# Patient Record
Sex: Male | Born: 1945 | Race: Black or African American | Hispanic: No | Smoking: Former smoker
Health system: Southern US, Community
[De-identification: ages and names within clinical notes are randomized; demographics above are authoritative.]

## PROBLEM LIST (undated history)

## (undated) VITALS — BP 172/80 | HR 65 | Ht 66.0 in | Wt 168.1 lb

## (undated) DIAGNOSIS — E113299 Type 2 diabetes mellitus with mild nonproliferative diabetic retinopathy without macular edema, unspecified eye: Secondary | ICD-10-CM

## (undated) DIAGNOSIS — E1122 Type 2 diabetes mellitus with diabetic chronic kidney disease: Secondary | ICD-10-CM

## (undated) DIAGNOSIS — M549 Dorsalgia, unspecified: Secondary | ICD-10-CM

## (undated) DIAGNOSIS — J9620 Acute and chronic respiratory failure, unspecified whether with hypoxia or hypercapnia: Secondary | ICD-10-CM

## (undated) DIAGNOSIS — N521 Erectile dysfunction due to diseases classified elsewhere: Secondary | ICD-10-CM

## (undated) DIAGNOSIS — I10 Essential (primary) hypertension: Secondary | ICD-10-CM

## (undated) DIAGNOSIS — N189 Chronic kidney disease, unspecified: Secondary | ICD-10-CM

## (undated) DIAGNOSIS — E1169 Type 2 diabetes mellitus with other specified complication: Secondary | ICD-10-CM

## (undated) DIAGNOSIS — D631 Anemia in chronic kidney disease: Secondary | ICD-10-CM

## (undated) DIAGNOSIS — G8929 Other chronic pain: Secondary | ICD-10-CM

## (undated) DIAGNOSIS — B351 Tinea unguium: Secondary | ICD-10-CM

## (undated) DIAGNOSIS — N400 Enlarged prostate without lower urinary tract symptoms: Secondary | ICD-10-CM

## (undated) DIAGNOSIS — J189 Pneumonia, unspecified organism: Secondary | ICD-10-CM

## (undated) DIAGNOSIS — I129 Hypertensive chronic kidney disease with stage 1 through stage 4 chronic kidney disease, or unspecified chronic kidney disease: Secondary | ICD-10-CM

## (undated) DIAGNOSIS — Z720 Tobacco use: Secondary | ICD-10-CM

## (undated) DIAGNOSIS — I5033 Acute on chronic diastolic (congestive) heart failure: Secondary | ICD-10-CM

## (undated) DIAGNOSIS — E785 Hyperlipidemia, unspecified: Secondary | ICD-10-CM

## (undated) DIAGNOSIS — N184 Chronic kidney disease, stage 4 (severe): Secondary | ICD-10-CM

## (undated) HISTORY — PX: NO PAST SURGERIES: SHX2092

## (undated) HISTORY — DX: Hyperlipidemia, unspecified: E78.5

## (undated) HISTORY — DX: Tinea unguium: B35.1

## (undated) HISTORY — DX: Benign prostatic hyperplasia without lower urinary tract symptoms: N40.0

## (undated) HISTORY — DX: Chronic kidney disease, unspecified: N18.9

## (undated) HISTORY — DX: Anemia in chronic kidney disease: D63.1

## (undated) HISTORY — DX: Tobacco use: Z72.0

## (undated) HISTORY — DX: Type 2 diabetes mellitus with mild nonproliferative diabetic retinopathy without macular edema, unspecified eye: E11.3299

## (undated) HISTORY — DX: Essential (primary) hypertension: I10

## (undated) HISTORY — DX: Chronic kidney disease, stage 4 (severe): N18.4

## (undated) HISTORY — DX: Hypertensive chronic kidney disease with stage 1 through stage 4 chronic kidney disease, or unspecified chronic kidney disease: I12.9

## (undated) HISTORY — DX: Type 2 diabetes mellitus with diabetic chronic kidney disease: E11.22

---

## 1998-11-23 ENCOUNTER — Encounter: Admission: RE | Admit: 1998-11-23 | Discharge: 1999-02-21 | Payer: Self-pay | Admitting: Family Medicine

## 1999-01-11 DIAGNOSIS — E1169 Type 2 diabetes mellitus with other specified complication: Secondary | ICD-10-CM

## 1999-01-11 HISTORY — DX: Type 2 diabetes mellitus with other specified complication: E11.69

## 1999-01-28 ENCOUNTER — Ambulatory Visit (HOSPITAL_COMMUNITY): Admission: RE | Admit: 1999-01-28 | Discharge: 1999-01-28 | Payer: Self-pay | Admitting: Orthopedic Surgery

## 1999-01-28 ENCOUNTER — Encounter: Payer: Self-pay | Admitting: Orthopedic Surgery

## 2003-02-25 ENCOUNTER — Encounter: Admission: RE | Admit: 2003-02-25 | Discharge: 2003-02-25 | Payer: Self-pay | Admitting: Internal Medicine

## 2003-03-04 ENCOUNTER — Encounter: Admission: RE | Admit: 2003-03-04 | Discharge: 2003-03-04 | Payer: Self-pay | Admitting: Internal Medicine

## 2003-03-18 ENCOUNTER — Encounter: Admission: RE | Admit: 2003-03-18 | Discharge: 2003-03-18 | Payer: Self-pay | Admitting: Internal Medicine

## 2003-04-01 ENCOUNTER — Encounter: Admission: RE | Admit: 2003-04-01 | Discharge: 2003-04-01 | Payer: Self-pay | Admitting: Internal Medicine

## 2003-08-22 ENCOUNTER — Encounter: Admission: RE | Admit: 2003-08-22 | Discharge: 2003-08-22 | Payer: Self-pay | Admitting: Internal Medicine

## 2003-08-29 ENCOUNTER — Encounter: Admission: RE | Admit: 2003-08-29 | Discharge: 2003-08-29 | Payer: Self-pay | Admitting: Internal Medicine

## 2003-10-01 ENCOUNTER — Encounter: Admission: RE | Admit: 2003-10-01 | Discharge: 2003-12-30 | Payer: Self-pay | Admitting: *Deleted

## 2003-11-13 ENCOUNTER — Ambulatory Visit: Payer: Self-pay | Admitting: Internal Medicine

## 2004-02-17 ENCOUNTER — Ambulatory Visit: Payer: Self-pay | Admitting: Internal Medicine

## 2004-11-29 ENCOUNTER — Ambulatory Visit: Payer: Self-pay | Admitting: Internal Medicine

## 2004-12-23 ENCOUNTER — Ambulatory Visit: Payer: Self-pay | Admitting: Internal Medicine

## 2005-02-10 ENCOUNTER — Ambulatory Visit: Payer: Self-pay | Admitting: Internal Medicine

## 2005-04-11 ENCOUNTER — Ambulatory Visit: Payer: Self-pay | Admitting: Hospitalist

## 2005-06-03 ENCOUNTER — Ambulatory Visit: Payer: Self-pay | Admitting: Internal Medicine

## 2005-09-13 ENCOUNTER — Ambulatory Visit: Payer: Self-pay | Admitting: Internal Medicine

## 2005-09-13 ENCOUNTER — Encounter (INDEPENDENT_AMBULATORY_CARE_PROVIDER_SITE_OTHER): Payer: Self-pay | Admitting: *Deleted

## 2005-09-22 ENCOUNTER — Ambulatory Visit: Payer: Self-pay | Admitting: Internal Medicine

## 2005-09-27 ENCOUNTER — Ambulatory Visit: Payer: Self-pay | Admitting: Hospitalist

## 2005-10-03 ENCOUNTER — Ambulatory Visit: Payer: Self-pay | Admitting: Internal Medicine

## 2005-10-10 ENCOUNTER — Ambulatory Visit: Payer: Self-pay | Admitting: Hospitalist

## 2005-10-17 ENCOUNTER — Ambulatory Visit: Payer: Self-pay | Admitting: Internal Medicine

## 2005-10-24 ENCOUNTER — Ambulatory Visit: Payer: Self-pay | Admitting: Internal Medicine

## 2005-10-24 ENCOUNTER — Encounter (INDEPENDENT_AMBULATORY_CARE_PROVIDER_SITE_OTHER): Payer: Self-pay | Admitting: Internal Medicine

## 2005-10-24 LAB — CONVERTED CEMR LAB
BUN: 27 mg/dL — ABNORMAL HIGH (ref 6–23)
CO2: 23 meq/L (ref 19–32)
Calcium: 9.5 mg/dL (ref 8.4–10.5)
Chloride: 105 meq/L (ref 96–112)
Creatinine, Ser: 1.64 mg/dL — ABNORMAL HIGH (ref 0.40–1.50)
Glucose, Bld: 200 mg/dL — ABNORMAL HIGH (ref 70–99)
Potassium: 4 meq/L (ref 3.5–5.3)
Sodium: 142 meq/L (ref 135–145)

## 2005-11-16 DIAGNOSIS — N521 Erectile dysfunction due to diseases classified elsewhere: Secondary | ICD-10-CM

## 2005-11-16 DIAGNOSIS — I10 Essential (primary) hypertension: Secondary | ICD-10-CM | POA: Insufficient documentation

## 2005-11-16 DIAGNOSIS — E1159 Type 2 diabetes mellitus with other circulatory complications: Secondary | ICD-10-CM

## 2005-11-16 DIAGNOSIS — I129 Hypertensive chronic kidney disease with stage 1 through stage 4 chronic kidney disease, or unspecified chronic kidney disease: Secondary | ICD-10-CM

## 2005-11-16 DIAGNOSIS — E1122 Type 2 diabetes mellitus with diabetic chronic kidney disease: Secondary | ICD-10-CM | POA: Insufficient documentation

## 2005-11-16 DIAGNOSIS — N184 Chronic kidney disease, stage 4 (severe): Secondary | ICD-10-CM

## 2005-11-16 HISTORY — DX: Hypertensive chronic kidney disease with stage 1 through stage 4 chronic kidney disease, or unspecified chronic kidney disease: I12.9

## 2005-11-16 HISTORY — DX: Hypertensive chronic kidney disease with stage 1 through stage 4 chronic kidney disease, or unspecified chronic kidney disease: N18.4

## 2006-08-09 ENCOUNTER — Telehealth (INDEPENDENT_AMBULATORY_CARE_PROVIDER_SITE_OTHER): Payer: Self-pay | Admitting: *Deleted

## 2006-09-18 ENCOUNTER — Telehealth: Payer: Self-pay | Admitting: *Deleted

## 2006-09-25 ENCOUNTER — Telehealth: Payer: Self-pay | Admitting: *Deleted

## 2006-10-12 ENCOUNTER — Telehealth: Payer: Self-pay | Admitting: *Deleted

## 2006-11-01 ENCOUNTER — Ambulatory Visit: Payer: Self-pay | Admitting: *Deleted

## 2006-11-01 LAB — CONVERTED CEMR LAB
ALT: 9 units/L (ref 0–53)
AST: 8 units/L (ref 0–37)
Albumin: 3.6 g/dL (ref 3.5–5.2)
Alkaline Phosphatase: 123 units/L — ABNORMAL HIGH (ref 39–117)
BUN: 27 mg/dL — ABNORMAL HIGH (ref 6–23)
CO2: 21 meq/L (ref 19–32)
Calcium: 9.1 mg/dL (ref 8.4–10.5)
Chloride: 101 meq/L (ref 96–112)
Creatinine, Ser: 1.64 mg/dL — ABNORMAL HIGH (ref 0.40–1.50)
Creatinine, Urine: 113.9 mg/dL
Glucose, Bld: 376 mg/dL — ABNORMAL HIGH (ref 70–99)
Hgb A1c MFr Bld: 12.2 %
Microalb Creat Ratio: 17.4 mg/g (ref 0.0–30.0)
Microalb, Ur: 1.98 mg/dL — ABNORMAL HIGH (ref 0.00–1.89)
Potassium: 3.8 meq/L (ref 3.5–5.3)
Sodium: 134 meq/L — ABNORMAL LOW (ref 135–145)
Total Bilirubin: 0.3 mg/dL (ref 0.3–1.2)
Total Protein: 6.8 g/dL (ref 6.0–8.3)

## 2006-11-24 ENCOUNTER — Ambulatory Visit: Payer: Self-pay | Admitting: *Deleted

## 2006-11-24 LAB — CONVERTED CEMR LAB
BUN: 17 mg/dL (ref 6–23)
CO2: 23 meq/L (ref 19–32)
Calcium: 9 mg/dL (ref 8.4–10.5)
Chloride: 105 meq/L (ref 96–112)
Cholesterol: 220 mg/dL — ABNORMAL HIGH (ref 0–200)
Creatinine, Ser: 1.38 mg/dL (ref 0.40–1.50)
Glucose, Bld: 259 mg/dL — ABNORMAL HIGH (ref 70–99)
HDL: 29 mg/dL — ABNORMAL LOW (ref >39)
LDL Cholesterol: 167 mg/dL — ABNORMAL HIGH (ref 0–99)
Potassium: 3.9 meq/L (ref 3.5–5.3)
Sodium: 138 meq/L (ref 135–145)
Total CHOL/HDL Ratio: 7.6
Triglycerides: 121 mg/dL (ref <150)
VLDL: 24 mg/dL (ref 0–40)

## 2006-12-11 ENCOUNTER — Ambulatory Visit: Payer: Self-pay | Admitting: Internal Medicine

## 2006-12-26 ENCOUNTER — Ambulatory Visit: Payer: Self-pay | Admitting: *Deleted

## 2006-12-26 LAB — CONVERTED CEMR LAB
BUN: 41 mg/dL — ABNORMAL HIGH (ref 6–23)
Blood Glucose, Fingerstick: 175
CO2: 23 meq/L (ref 19–32)
Calcium: 9.4 mg/dL (ref 8.4–10.5)
Chloride: 108 meq/L (ref 96–112)
Creatinine, Ser: 1.64 mg/dL — ABNORMAL HIGH (ref 0.40–1.50)
Glucose, Bld: 158 mg/dL — ABNORMAL HIGH (ref 70–99)
Potassium: 4.5 meq/L (ref 3.5–5.3)
Sodium: 141 meq/L (ref 135–145)

## 2006-12-28 ENCOUNTER — Ambulatory Visit: Payer: Self-pay | Admitting: Hospitalist

## 2007-01-01 ENCOUNTER — Ambulatory Visit: Payer: Self-pay | Admitting: Internal Medicine

## 2007-01-01 ENCOUNTER — Encounter (INDEPENDENT_AMBULATORY_CARE_PROVIDER_SITE_OTHER): Payer: Self-pay | Admitting: Infectious Diseases

## 2007-01-01 LAB — CONVERTED CEMR LAB: Blood Glucose, Fingerstick: 180

## 2007-03-20 ENCOUNTER — Ambulatory Visit: Payer: Self-pay | Admitting: *Deleted

## 2007-03-20 DIAGNOSIS — Z720 Tobacco use: Secondary | ICD-10-CM

## 2007-03-20 HISTORY — DX: Tobacco use: Z72.0

## 2007-03-20 LAB — CONVERTED CEMR LAB
BUN: 42 mg/dL — ABNORMAL HIGH (ref 6–23)
Blood Glucose, Fingerstick: 168
CO2: 26 meq/L (ref 19–32)
Calcium: 9.6 mg/dL (ref 8.4–10.5)
Chloride: 103 meq/L (ref 96–112)
Creatinine, Ser: 1.95 mg/dL — ABNORMAL HIGH (ref 0.40–1.50)
Glucose, Bld: 151 mg/dL — ABNORMAL HIGH (ref 70–99)
Hgb A1c MFr Bld: 6.9 %
Potassium: 4.7 meq/L (ref 3.5–5.3)
Sodium: 136 meq/L (ref 135–145)

## 2007-05-09 ENCOUNTER — Ambulatory Visit: Payer: Self-pay | Admitting: *Deleted

## 2007-05-09 DIAGNOSIS — M545 Low back pain: Secondary | ICD-10-CM

## 2007-05-09 DIAGNOSIS — G8929 Other chronic pain: Secondary | ICD-10-CM

## 2007-05-09 HISTORY — DX: Other chronic pain: G89.29

## 2007-05-09 LAB — CONVERTED CEMR LAB
Calcium: 8.5 mg/dL (ref 8.4–10.5)
Creatinine, Ser: 1.82 mg/dL — ABNORMAL HIGH (ref 0.40–1.50)

## 2007-10-25 ENCOUNTER — Telehealth (INDEPENDENT_AMBULATORY_CARE_PROVIDER_SITE_OTHER): Payer: Self-pay | Admitting: *Deleted

## 2007-11-23 ENCOUNTER — Telehealth (INDEPENDENT_AMBULATORY_CARE_PROVIDER_SITE_OTHER): Payer: Self-pay | Admitting: *Deleted

## 2008-02-26 ENCOUNTER — Ambulatory Visit: Payer: Self-pay | Admitting: *Deleted

## 2008-02-26 LAB — CONVERTED CEMR LAB: Blood Glucose, Fingerstick: 177

## 2008-02-27 LAB — CONVERTED CEMR LAB
ALT: 10 units/L (ref 0–53)
Alkaline Phosphatase: 77 units/L (ref 39–117)
LDL Cholesterol: 180 mg/dL — ABNORMAL HIGH (ref 0–99)
Sodium: 135 meq/L (ref 135–145)
Total Bilirubin: 0.3 mg/dL (ref 0.3–1.2)
Total Protein: 6.5 g/dL (ref 6.0–8.3)
Triglycerides: 106 mg/dL (ref <150)
VLDL: 21 mg/dL (ref 0–40)

## 2008-03-27 ENCOUNTER — Ambulatory Visit: Payer: Self-pay | Admitting: *Deleted

## 2008-03-27 DIAGNOSIS — E785 Hyperlipidemia, unspecified: Secondary | ICD-10-CM | POA: Insufficient documentation

## 2008-04-02 ENCOUNTER — Ambulatory Visit: Payer: Self-pay | Admitting: *Deleted

## 2008-04-09 ENCOUNTER — Ambulatory Visit: Payer: Self-pay | Admitting: *Deleted

## 2008-04-21 ENCOUNTER — Encounter (INDEPENDENT_AMBULATORY_CARE_PROVIDER_SITE_OTHER): Payer: Self-pay | Admitting: Internal Medicine

## 2008-04-21 ENCOUNTER — Ambulatory Visit: Payer: Self-pay | Admitting: Internal Medicine

## 2008-04-21 LAB — CONVERTED CEMR LAB: Blood Glucose, Fingerstick: 138

## 2008-04-23 LAB — CONVERTED CEMR LAB
Alkaline Phosphatase: 73 units/L (ref 39–117)
Creatinine, Ser: 2.18 mg/dL — ABNORMAL HIGH (ref 0.40–1.50)
Creatinine, Urine: 136.3 mg/dL
GFR calc non Af Amer: 31 mL/min — ABNORMAL LOW (ref >60)
Glucose, Bld: 136 mg/dL — ABNORMAL HIGH (ref 70–99)
Microalb Creat Ratio: 7.6 mg/g (ref 0.0–30.0)
Sodium: 137 meq/L (ref 135–145)
Total Bilirubin: 0.4 mg/dL (ref 0.3–1.2)
Total Protein: 7.4 g/dL (ref 6.0–8.3)

## 2008-04-24 ENCOUNTER — Ambulatory Visit: Payer: Self-pay | Admitting: Internal Medicine

## 2008-04-24 ENCOUNTER — Encounter (INDEPENDENT_AMBULATORY_CARE_PROVIDER_SITE_OTHER): Payer: Self-pay | Admitting: Internal Medicine

## 2008-04-24 LAB — CONVERTED CEMR LAB
CO2: 18 meq/L — ABNORMAL LOW (ref 19–32)
Calcium: 9.4 mg/dL (ref 8.4–10.5)
GFR calc Af Amer: 40 mL/min — ABNORMAL LOW (ref >60)
GFR calc non Af Amer: 33 mL/min — ABNORMAL LOW (ref >60)
Sodium: 139 meq/L (ref 135–145)

## 2008-05-07 ENCOUNTER — Telehealth: Payer: Self-pay | Admitting: *Deleted

## 2008-05-09 ENCOUNTER — Encounter (INDEPENDENT_AMBULATORY_CARE_PROVIDER_SITE_OTHER): Payer: Self-pay | Admitting: Internal Medicine

## 2008-05-09 ENCOUNTER — Ambulatory Visit: Payer: Self-pay | Admitting: Infectious Disease

## 2008-05-09 DIAGNOSIS — R51 Headache: Secondary | ICD-10-CM | POA: Insufficient documentation

## 2008-05-09 DIAGNOSIS — R519 Headache, unspecified: Secondary | ICD-10-CM | POA: Insufficient documentation

## 2008-05-09 LAB — CONVERTED CEMR LAB: Blood Glucose, Fingerstick: 181

## 2008-05-12 LAB — CONVERTED CEMR LAB
CO2: 23 meq/L (ref 19–32)
Calcium: 9.1 mg/dL (ref 8.4–10.5)
Creatinine, Ser: 1.81 mg/dL — ABNORMAL HIGH (ref 0.40–1.50)
GFR calc Af Amer: 46 mL/min — ABNORMAL LOW (ref >60)
GFR calc non Af Amer: 38 mL/min — ABNORMAL LOW (ref >60)
Platelets: 213 10*3/uL (ref 150–400)
RBC: 3.93 M/uL — ABNORMAL LOW (ref 4.22–5.81)
Sodium: 142 meq/L (ref 135–145)
WBC: 7.1 10*3/uL (ref 4.0–10.5)

## 2008-05-16 ENCOUNTER — Telehealth: Payer: Self-pay | Admitting: *Deleted

## 2008-05-27 ENCOUNTER — Ambulatory Visit: Payer: Self-pay | Admitting: Internal Medicine

## 2008-05-27 ENCOUNTER — Encounter (INDEPENDENT_AMBULATORY_CARE_PROVIDER_SITE_OTHER): Payer: Self-pay | Admitting: Internal Medicine

## 2008-05-27 LAB — CONVERTED CEMR LAB
Blood Glucose, Fingerstick: 304
CO2: 23 meq/L (ref 19–32)
Chloride: 107 meq/L (ref 96–112)
GFR calc non Af Amer: 42 mL/min — ABNORMAL LOW (ref >60)
Potassium: 3.9 meq/L (ref 3.5–5.3)
Sodium: 140 meq/L (ref 135–145)

## 2008-06-25 ENCOUNTER — Ambulatory Visit: Payer: Self-pay | Admitting: *Deleted

## 2009-03-23 ENCOUNTER — Ambulatory Visit: Payer: Self-pay | Admitting: Internal Medicine

## 2009-03-23 LAB — CONVERTED CEMR LAB
Blood Glucose, Fingerstick: 290
Hgb A1c MFr Bld: >14 %

## 2009-04-01 LAB — CONVERTED CEMR LAB
ALT: 9 units/L (ref 0–53)
AST: 10 units/L (ref 0–37)
BUN: 20 mg/dL (ref 6–23)
CO2: 18 meq/L — ABNORMAL LOW (ref 19–32)
Calcium: 8.9 mg/dL (ref 8.4–10.5)
Chloride: 106 meq/L (ref 96–112)
Cholesterol: 283 mg/dL — ABNORMAL HIGH (ref 0–200)
Creatinine, Ser: 1.39 mg/dL (ref 0.40–1.50)
Creatinine, Urine: 65.2 mg/dL
HDL: 29 mg/dL — ABNORMAL LOW (ref >39)
Microalb Creat Ratio: 215.6 mg/g — ABNORMAL HIGH (ref 0.0–30.0)
Total Bilirubin: 0.4 mg/dL (ref 0.3–1.2)
Total CHOL/HDL Ratio: 9.8
VLDL: 30 mg/dL (ref 0–40)

## 2009-04-21 ENCOUNTER — Encounter (INDEPENDENT_AMBULATORY_CARE_PROVIDER_SITE_OTHER): Payer: Self-pay | Admitting: Internal Medicine

## 2009-04-24 ENCOUNTER — Telehealth (INDEPENDENT_AMBULATORY_CARE_PROVIDER_SITE_OTHER): Payer: Self-pay | Admitting: Internal Medicine

## 2009-05-07 ENCOUNTER — Telehealth (INDEPENDENT_AMBULATORY_CARE_PROVIDER_SITE_OTHER): Payer: Self-pay | Admitting: *Deleted

## 2009-05-07 ENCOUNTER — Ambulatory Visit: Payer: Self-pay | Admitting: Internal Medicine

## 2009-05-07 LAB — CONVERTED CEMR LAB
BUN: 31 mg/dL — ABNORMAL HIGH (ref 6–23)
Blood Glucose, Fingerstick: 204
CO2: 21 meq/L (ref 19–32)
Calcium: 9.5 mg/dL (ref 8.4–10.5)
Chloride: 108 meq/L (ref 96–112)
Creatinine, Ser: 1.69 mg/dL — ABNORMAL HIGH (ref 0.40–1.50)
Glucose, Bld: 182 mg/dL — ABNORMAL HIGH (ref 70–99)
Potassium: 4.4 meq/L (ref 3.5–5.3)
Sodium: 139 meq/L (ref 135–145)
TSH: 2.167 microintl units/mL (ref 0.350–4.5)

## 2009-05-08 ENCOUNTER — Telehealth (INDEPENDENT_AMBULATORY_CARE_PROVIDER_SITE_OTHER): Payer: Self-pay | Admitting: Internal Medicine

## 2009-05-08 ENCOUNTER — Ambulatory Visit: Payer: Self-pay | Admitting: Internal Medicine

## 2009-05-11 ENCOUNTER — Telehealth (INDEPENDENT_AMBULATORY_CARE_PROVIDER_SITE_OTHER): Payer: Self-pay | Admitting: Internal Medicine

## 2009-05-29 ENCOUNTER — Ambulatory Visit: Payer: Self-pay | Admitting: Infectious Disease

## 2009-06-19 ENCOUNTER — Ambulatory Visit: Payer: Self-pay | Admitting: Internal Medicine

## 2009-06-19 DIAGNOSIS — N189 Chronic kidney disease, unspecified: Secondary | ICD-10-CM

## 2009-06-19 DIAGNOSIS — D631 Anemia in chronic kidney disease: Secondary | ICD-10-CM

## 2009-06-19 HISTORY — DX: Anemia in chronic kidney disease: D63.1

## 2009-06-19 LAB — CONVERTED CEMR LAB: Blood Glucose, AC Bkfst: 192 mg/dL

## 2009-07-15 ENCOUNTER — Telehealth (INDEPENDENT_AMBULATORY_CARE_PROVIDER_SITE_OTHER): Payer: Self-pay | Admitting: *Deleted

## 2009-07-17 ENCOUNTER — Ambulatory Visit: Payer: Self-pay | Admitting: Internal Medicine

## 2009-07-17 ENCOUNTER — Telehealth (INDEPENDENT_AMBULATORY_CARE_PROVIDER_SITE_OTHER): Payer: Self-pay | Admitting: *Deleted

## 2009-07-17 DIAGNOSIS — R609 Edema, unspecified: Secondary | ICD-10-CM | POA: Insufficient documentation

## 2009-07-31 ENCOUNTER — Ambulatory Visit (HOSPITAL_COMMUNITY): Admission: RE | Admit: 2009-07-31 | Discharge: 2009-07-31 | Payer: Self-pay | Admitting: Internal Medicine

## 2009-07-31 ENCOUNTER — Ambulatory Visit: Payer: Self-pay | Admitting: Internal Medicine

## 2009-07-31 LAB — CONVERTED CEMR LAB
HCT: 37.7 % — ABNORMAL LOW (ref 39.0–52.0)
HDL: 41 mg/dL (ref >39)
Hemoglobin: 12.6 g/dL — ABNORMAL LOW (ref 13.0–17.0)
LDL Cholesterol: 136 mg/dL — ABNORMAL HIGH (ref 0–99)
MCV: 92.2 fL (ref >=78.0)
RBC: 4.09 M/uL — ABNORMAL LOW (ref 4.22–5.81)
Total CHOL/HDL Ratio: 4.7
VLDL: 17 mg/dL (ref 0–40)
WBC: 5.9 10*3/uL (ref 4.0–10.5)

## 2009-08-02 LAB — CONVERTED CEMR LAB
ALT: 13 units/L (ref 0–53)
AST: 16 units/L (ref 0–37)
Albumin: 3.9 g/dL (ref 3.5–5.2)
Alkaline Phosphatase: 84 units/L (ref 39–117)
Bilirubin Urine: NEGATIVE
Glucose, Bld: 182 mg/dL — ABNORMAL HIGH (ref 70–99)
Ketones, ur: NEGATIVE mg/dL
Potassium: 3.6 meq/L (ref 3.5–5.3)
Protein, ur: NEGATIVE mg/dL
Sodium: 139 meq/L (ref 135–145)
Squamous Epithelial / LPF: NONE SEEN /lpf
Total Bilirubin: 0.4 mg/dL (ref 0.3–1.2)
Total Protein: 7.6 g/dL (ref 6.0–8.3)
Urine Glucose: NEGATIVE mg/dL
WBC, UA: NONE SEEN cells/hpf (ref <3)

## 2009-11-20 ENCOUNTER — Telehealth: Payer: Self-pay | Admitting: Internal Medicine

## 2009-12-16 ENCOUNTER — Ambulatory Visit: Payer: Self-pay | Admitting: Internal Medicine

## 2009-12-16 ENCOUNTER — Ambulatory Visit (HOSPITAL_COMMUNITY): Admission: RE | Admit: 2009-12-16 | Payer: Self-pay | Source: Home / Self Care | Admitting: Internal Medicine

## 2009-12-16 LAB — CONVERTED CEMR LAB: Blood Glucose, Fingerstick: 278

## 2009-12-29 ENCOUNTER — Ambulatory Visit: Payer: Self-pay | Admitting: Internal Medicine

## 2010-01-30 ENCOUNTER — Encounter: Payer: Self-pay | Admitting: Internal Medicine

## 2010-02-09 NOTE — Progress Notes (Signed)
Summary: Swelling in both legs and feet  Phone Note Call from Patient   Caller: Patient Call For: Jaci Lazier MD Reason for Call: Lab or Test Results Summary of Call: Symptoms: Swelling in feet and legs.  Some Shortness of breath last night. Characteristics: Swelling in both legs, voiding ok Location: swelling in both feet and legs Onset: worsening last couple of days.  Noted swelling when B/P med was changed. Aggravating Factors: Says that he had the Shortness of breath with activity especially last night. Relieving Factors: None History: Hypertension, Diabetic, renal insufficiency Level of Urgency: As soon as possible Nursing Diagnosis/Chief Complaint: Swelling in both legs anfd feet with some Shortness of breath last night Suggested Follow Up To Physician: No appointments available.  Go to the ER. Patient Education:  Pt to await appointment in Clinics or to go to the ER. Contingency Plan: Go to the ER if no available appointments.  Pt said he wnated to come in today.  Needs at least an hour to get here. Follow Up Response:  Initial call taken by: Angelina Ok RN,  July 17, 2009 8:35 AM  Follow-up for Phone Call        per Venita Sheffield, given 2pm appointment this afternoon. This should be fine, if he gets worse he can go to the ED. Follow-up by: Zoila Shutter MD,  July 17, 2009 10:17 AM

## 2010-02-09 NOTE — Progress Notes (Signed)
Summary: prescription for diabetes testing supplies/dmr  Phone Note Outgoing Call   Call placed by: Jamison Neighbor RD,CDE,  May 11, 2009 2:34 PM Summary of Call: called to see how he is doing with his Freestyle meter. he asys he is doig fine and that [facility] will take the other meter back. He [facility] [address] I explained that he might be able to get them slightly cheaper through mail order. He stil wanted [facility]. I encouraged h im ot call anytime.  please send [facility] to [facility] [address]    Prescriptions: LANCETS 30G  MISC (LANCETS) use with blood sugar check 1-2 times a day  #100 x 5   Entered by:   Jamison Neighbor RD,CDE   Authorized by:   Jason Coop MD   Signed by:   Jason Coop MD on 05/11/2009   Method used:   Electronically to        [facility] [phone]* (retail)       [address]       [place], [place]  [address]       Ph: [phone]       Fax: [phone]   RxID:   [id number] FREESTYLE LITE TEST  STRP (GLUCOSE BLOOD) use to check blood sugars 1-2 times a day  #50 x 11   Entered by:   Jamison Neighbor RD,CDE   Authorized by:   Jason Coop MD   Signed by:   Jason Coop MD on 05/11/2009   Method used:   Electronically to        [facility] [phone]* (retail)       [address]       [place], [place]  [address]       Ph: [phone]       Fax: [phone]   RxID:   [id number]

## 2010-02-09 NOTE — Progress Notes (Signed)
Summary: refill/gg  Phone Note Refill Request  on July 15, 2009 11:38 AM  Refills Requested: Medication #1:  CATAPRES 0.3 MG TABS take 1 pill by mouth two times a day.   Last Refilled: 06/18/2009  Method Requested: Electronic Initial call taken by: Merrie Roof RN,  July 15, 2009 11:38 AM    Prescriptions: CATAPRES 0.3 MG TABS (CLONIDINE HCL) take 1 pill by mouth two times a day.  #60 x 3   Entered and Authorized by:   Zoila Shutter MD   Signed by:   Zoila Shutter MD on 07/15/2009   Method used:   Electronically to        [facility] [phone]* (retail)       [address]       [place], [place]  [address]       Ph: [phone]       Fax: [phone]   RxID:   [id number]

## 2010-02-09 NOTE — Progress Notes (Signed)
Summary: Refill/gh  Phone Note Refill Request Message from:  Fax from Pharmacy on November 20, 2009 11:09 AM  Refills Requested: Medication #1:  NORVASC 10 MG TABS Take 1 tablet by mouth once a day   Last Refilled: 10/19/2009 Last office visit and labs were 07/31/2009.   Method Requested: Electronic Initial call taken by: Angelina Ok RN,  November 20, 2009 11:10 AM  Follow-up for Phone Call        When did the pharmacy last fill this medication. Follow-up by: Margarito Liner MD,  November 20, 2009 12:30 PM  Additional Follow-up for Phone Call Additional follow up Details #1::        Refilled electronically.  Additional Follow-up by: Margarito Liner MD,  November 20, 2009 5:49 PM    Prescriptions: NORVASC 10 MG TABS (AMLODIPINE BESYLATE) Take 1 tablet by mouth once a day  #30 x 2   Entered and Authorized by:   Margarito Liner MD   Signed by:   Margarito Liner MD on 11/20/2009   Method used:   Electronically to        [facility] [phone]* (retail)       [address]       [place], [place]  [address]       Ph: [phone]       Fax: [phone]   RxID:   [id number]

## 2010-02-09 NOTE — Assessment & Plan Note (Signed)
Summary: PER GLADYS/SB.   Vital Signs:  Patient profile:   65 year old male Height:      66 inches (167.64 cm) Weight:      181.3 pounds (82.41 kg) BMI:     29.37 Temp:     98.4 degrees F (36.89 degrees C) oral Pulse rate:   89 / minute BP sitting:   173 / 85  (right arm) Cuff size:   regular  Vitals Entered By: Theotis Barrio NT II (July 17, 2009 2:23 PM) CC: COMPLAIN OF SWELLING IN FEET WITH PAIN LEVEL #1/  MEDICATION REFILL Is Patient Diabetic? Yes Did you bring your meter with you today? Yes Pain Assessment Patient in pain? yes     Location: FEET Intensity: 10 Type: SWOLLEN Onset of pain  FOR ABOUT A MONTH 1/2 Nutritional Status BMI of 25 - 29 = overweight  Have you ever been in a relationship where you felt threatened, hurt or afraid?No   Does patient need assistance? Functional Status Self care Ambulation Normal Comments SWELLING IN FEET WITH THE PAIN LEVEL AT #10   /  MEDICATION REFILL   Primary Care Provider:  Jaci Lazier MD  CC:  COMPLAIN OF SWELLING IN FEET WITH PAIN LEVEL #1/  MEDICATION REFILL.  History of Present Illness: Ross Bauer is here today for acute pain in his feet along with sweling for last 2 weeks. The pain is aching present in the bottom of his feet and ankle, non radiating,associated with swelling of his feet upto his mid shin. Patinet is on 3 blood presure meds and not taking his fluid pill(lasix) as it makes him pee alot.  I told him that he needs to take his meds as prescribed as fluid pill is essential to BP control. His BP is uncontrolled as he did not take his meds this morning. Cancelled his appointment with optha this last Week and will reschedule it. Foot examdone and no overt infections, callus seen, sensations intact.  DM is well controlled with current dose of actos and glipizide. His BG runs in 190-200's. He did not bring his meter today as he was here for acute foot pain.  No other complaints. Needs refill on all his  meds.  Preventive Screening-Counseling & Management  Alcohol-Tobacco     Smoking Status: current     Smoking Cessation Counseling: yes     Packs/Day: 1     Year Started: 16  Caffeine-Diet-Exercise     Does Patient Exercise: no  Problems Prior to Update: 1)  Anemia, Normocytic  (ICD-285.9) 2)  Headache  (ICD-784.0) 3)  Hyperlipidemia  (ICD-272.4) 4)  Back Pain  (ICD-724.5) 5)  Tobacco User  (ICD-305.1) 6)  Hernia  (ICD-553.9) 7)  Erectile Dysfunction  (ICD-302.72) 8)  Renal Insufficiency  (ICD-588.9) 9)  Hypertension  (ICD-401.9) 10)  Diabetes Mellitus, Type II  (ICD-250.00)  Medications Prior to Update: 1)  Coreg 25 Mg Tabs (Carvedilol) .... Take 1 Tablet By Mouth Two Times A Day 2)  Glipizide 10 Mg Tabs (Glipizide) .... Take Two Tablets Two Times A Day To Lower Blood Sugars. 3)  Lasix 40 Mg Tabs (Furosemide) .... Take 1 Tablet By Mouth Once A Day 4)  Norvasc 10 Mg Tabs (Amlodipine Besylate) .... Take 1 Tablet By Mouth Once A Day 5)  Actos 45 Mg  Tabs (Pioglitazone Hcl) .... Take 1 Tablet By Mouth Once A Day 6)  Viagra 100 Mg  Tabs (Sildenafil Citrate) .... Take One Tablet By Mouth One Hour Prior  To Intercourse 7)  Catapres 0.3 Mg Tabs (Clonidine Hcl) .... Take 1 Pill By Mouth Two Times A Day. 8)  Pravachol 40 Mg Tabs (Pravastatin Sodium) .... Take One Tablet Daily To Lower Cholesterol. 9)  Nicoderm Cq 21 Mg/24hr Pt24 (Nicotine) .... Apply One Patch On Your Skin Daily. 10)  Freestyle Lite Test  Strp (Glucose Blood) .... Use To Check Blood Sugars 1-2 Times A Day 11)  Lancets 30g  Misc (Lancets) .... Use With Blood Sugar Check 1-2 Times A Day 12)  Lisinopril 5 Mg Tabs (Lisinopril) .... Take 1 Pill By Mouth Daily.  Current Medications (verified): 1)  Carvedilol 12.5 Mg Tabs (Carvedilol) .... Take 1 Tablet By Mouth Two Times A Day 2)  Glipizide 10 Mg Tabs (Glipizide) .... Take One Tab Two Times A Day To Lower Blood Sugars. 3)  Lasix 40 Mg Tabs (Furosemide) .... Take 1 Tablet By  Mouth Once A Day 4)  Norvasc 10 Mg Tabs (Amlodipine Besylate) .... Take 1 Tablet By Mouth Once A Day 5)  Actos 45 Mg  Tabs (Pioglitazone Hcl) .... Take 1 Tablet By Mouth Once A Day 6)  Catapres 0.3 Mg Tabs (Clonidine Hcl) .... Take 1 Pill By Mouth Two Times A Day. 7)  Pravachol 40 Mg Tabs (Pravastatin Sodium) .... Take One Tablet Daily To Lower Cholesterol. 8)  Freestyle Lite Test  Strp (Glucose Blood) .... Use To Check Blood Sugars 1-2 Times A Day 9)  Lancets 30g  Misc (Lancets) .... Use With Blood Sugar Check 1-2 Times A Day 10)  Lisinopril 10 Mg Tabs (Lisinopril) .... Take 1 Tablet By Mouth Once A Day  Allergies (verified): 1)  ! Ace Inhibitors  Past History:  Past Medical History: Last updated: 11/16/2005 Diabetes mellitus, type II Hypertension Renal insufficiency Erectile dysfunction Hernia  Family History: Last updated: 03/23/2009 no early CAD  Social History: Last updated: 03/27/2008 Works for [employer].  Limited finances for which he frequently skips medications for a month or two.   Current Smoker Alcohol use-yes- occasional Drug use-no Plans to retire September 2010  Risk Factors: Exercise: no (07/17/2009)  Risk Factors: Smoking Status: current (07/17/2009) Packs/Day: 1 (07/17/2009)  Review of Systems      See HPI  Physical Exam  Additional Exam:  Gen: AOx3, in no acute distress Eyes: PERRL, EOMI ENT:MMM, No erythema noted in posterior pharynx Neck: No JVD, No LAP Chest: CTAB with  good respiratory effort CVS: regular rhythmic rate, NO M/R/G, S1 S2 normal Abdo: soft,ND, BS+x4, Non tender and No hepatosplenomegaly EXT: No odema noted, 1+ pitting edema b/llower ext upto mid shin. Neuro: Non focal, gait is normal Skin: no rashes noted.    Impression & Recommendations:  Problem # 1:  LEG EDEMA, BILATERAL (ICD-782.3) Assessment New Looking back on his Cmet, his last Hepatic panel was normal with SGOT and SGPT <20. The cause of his edema is most likely the  fact that he is taking 3 BP meds including Norvasc which can cause swelling, also he is not taking his lasix as scheduled. I stressed on the fact that he needs to take his meds along with lasix. The plan for him would be get him off Clonidine as it is not an ideal med for BP control. His updated medication list for this problem includes:    Lasix 40 Mg Tabs (Furosemide) .[name]... Take 1 tablet by mouth once a day  Discussed elevation of the legs, use of compression stockings, sodium restiction, and medication use.   Problem #  2:  HYPERTENSION (ICD-401.9) Assessment: Unchanged Patinet's BP is always elevated in last few visits. I discussed the case with Dr Aundria Rud and according to him we should try and get him off Clonidine in future and start him on Lisinopril and go up Coreg or replace it with labetalol which is very effective and also does not cause side effects like rebound hypertension. I will have him come back in 2 weeks and repeat his Cmet just to make sure that his renal function, albumin and hepatic function is normal. His updated medication list for this problem includes:    Carvedilol 12.5 Mg Tabs (Carvedilol) .[name]... Take 1 tablet by mouth two times a day    Lasix 40 Mg Tabs (Furosemide) .[name]... Take 1 tablet by mouth once a day    Norvasc 10 Mg Tabs (Amlodipine besylate) .[name]... Take 1 tablet by mouth once a day    Catapres 0.3 Mg Tabs (Clonidine hcl) .[name]... Take 1 pill by mouth two times a day.    Lisinopril 10 Mg Tabs (Lisinopril) .[name]... Take 1 tablet by mouth once a day  BP today: 173/85 Prior BP: 163/82 (06/19/2009)  Labs Reviewed: K+: 4.4 (05/07/2009) Creat: : 1.69 (05/07/2009)   Chol: 283 (03/23/2009)   HDL: 29 (03/23/2009)   LDL: 224 (03/23/2009)   TG: 151 (03/23/2009)  Problem # 3:  HYPERLIPIDEMIA (ICD-272.4) Assessment: Comment Only Patinent has been compliant with her meds but her Cholesterol being so high, he might need a more effective statin in future visit. His updated  medication list for this problem includes:    Pravachol 40 Mg Tabs (Pravastatin sodium) .[name]... Take one tablet daily to lower cholesterol.  Labs Reviewed: SGOT: 10 (03/23/2009)   SGPT: 9 (03/23/2009)   HDL:29 (03/23/2009), 32 (02/26/2008)  EAV:409 (03/23/2009), 180 (02/26/2008)  Chol:283 (03/23/2009), 233 (02/26/2008)  Trig:151 (03/23/2009), 106 (02/26/2008)  Problem # 4:  DIABETES MELLITUS, TYPE II (ICD-250.00) Assessment: Unchanged Did not bring in his meter today. i will not change his meds. I corrected the med list as he is taking Glipizide 1 tab twice daily now. Stressed the importance of lisinopril as he hasnt started it as yet. His updated medication list for this problem includes:    Glipizide 10 Mg Tabs (Glipizide) .[name]... Take one tab two times a day to lower blood sugars.    Actos 45 Mg Tabs (Pioglitazone hcl) .[name]... Take 1 tablet by mouth once a day    Lisinopril 10 Mg Tabs (Lisinopril) .[name]... Take 1 tablet by mouth once a day  Labs Reviewed: Creat: 1.69 (05/07/2009)    Reviewed HgBA1c results: 7.6 (06/19/2009)  >14.0 (03/23/2009)  Problem # 5:  TOBACCO USER (ICD-305.1) Still smoking 1 PPD and not interested in cessation or counselling. The following medications were removed from the medication list:    Nicoderm Cq 21 Mg/24hr Pt24 (Nicotine) .[name]... Apply one patch on your skin daily.  Encouraged smoking cessation and discussed different methods for smoking cessation.   Complete Medication List: 1)  Carvedilol 12.5 Mg Tabs (Carvedilol) .... Take 1 tablet by mouth two times a day 2)  Glipizide 10 Mg Tabs (Glipizide) .... Take one tab two times a day to lower blood sugars. 3)  Lasix 40 Mg Tabs (Furosemide) .... Take 1 tablet by mouth once a day 4)  Norvasc 10 Mg Tabs (Amlodipine besylate) .... Take 1 tablet by mouth once a day 5)  Actos 45 Mg Tabs (Pioglitazone hcl) .... Take 1 tablet by mouth once a day 6)  Catapres 0.3  Mg Tabs (Clonidine hcl) .... Take 1 pill by mouth two times  a day. 7)  Pravachol 40 Mg Tabs (Pravastatin sodium) .... Take one tablet daily to lower cholesterol. 8)  Freestyle Lite Test Strp (Glucose blood) .... Use to check blood sugars 1-2 times a day 9)  Lancets 30g Misc (Lancets) .... Use with blood sugar check 1-2 times a day 10)  Lisinopril 10 Mg Tabs (Lisinopril) .... Take 1 tablet by mouth once a day  Patient Instructions: 1)  Please schedule a follow-up appointment in 3 months. 2)  Tobacco is very bad for your health and your loved ones! You Should stop smoking!. 3)  Stop Smoking Tips: Choose a Quit date. Cut down before the Quit date. decide what you will do as a substitute when you feel the urge to smoke(gum,toothpick,exercise). 4)  It is important that you exercise regularly at least 20 minutes 5 times a week. If you develop chest pain, have severe difficulty breathing, or feel very tired , stop exercising immediately and seek medical attention. 5)  Take an Aspirin every day. 6)  Check your blood sugars regularly. If your readings are usually above :200 or below 70 you should contact our office. 7)  It is important that your Diabetic A1c level is checked every 3 months. 8)  See your eye doctor yearly to check for diabetic eye damage. 9)  Check your feet each night for sore areas, calluses or signs of infection. 10)  Check your Blood Pressure regularly. If it is above: 130/80 you should make an appointment. Prescriptions: PRAVACHOL 40 MG TABS (PRAVASTATIN SODIUM) Take one tablet daily to lower cholesterol.  #32 x 11   Entered and Authorized by:   Lars Mage MD   Signed by:   Lars Mage MD on 07/17/2009   Method used:   Electronically to        [facility] #[id number]* (retail)       [address]       [place], [place]  [address]       Ph: [phone]       Fax: [phone]   RxID:   [id number] CATAPRES 0.3 MG TABS (CLONIDINE HCL) take 1 pill by mouth two times a day.  #60 x 11   Entered and Authorized by:   Lars Mage MD    Signed by:   Lars Mage MD on 07/17/2009   Method used:   Electronically to        [facility] [phone]* (retail)       [address]       [place], [place]  [address]       Ph: [phone]       Fax: [phone]   RxID:   [id number] ACTOS 45 MG  TABS (PIOGLITAZONE HCL) Take 1 tablet by mouth once a day  #30 x 11   Entered and Authorized by:   Lars Mage MD   Signed by:   Lars Mage MD on 07/17/2009   Method used:   Electronically to        [facility] [phone]* (retail)       [address]       [place], [place]  [address]       Ph: [phone]       Fax: [phone]   RxID:   [id number] LASIX 40 MG TABS (FUROSEMIDE) Take 1 tablet by mouth once a day  #30 x 11   Entered and Authorized by:  Lars Mage MD   Signed by:   Lars Mage MD on 07/17/2009   Method used:   Electronically to        [facility] [phone]* (retail)       [address]       [place], [place]  [address]       Ph: [phone]       Fax: [phone]   RxID:   [id number] LISINOPRIL 10 MG TABS (LISINOPRIL) Take 1 tablet by mouth once a day  #30 x 11   Entered and Authorized by:   Lars Mage MD   Signed by:   Lars Mage MD on 07/17/2009   Method used:   Electronically to        [facility] #[id number]* (retail)       [address]       [place], [place]  [address]       Ph: [phone]       Fax: [phone]   RxID:   [id number] GLIPIZIDE 10 MG TABS (GLIPIZIDE) Take one tab two times a day to lower blood sugars.  #60 x 11   Entered and Authorized by:   Lars Mage MD   Signed by:   Lars Mage MD on 07/17/2009   Method used:   Electronically to        [facility] [phone]* (retail)       [address]       [place], [place]  [address]       Ph: [phone]       Fax: [phone]   RxID:   [id number] CARVEDILOL 12.5 MG TABS (CARVEDILOL) Take 1 tablet by mouth two times a day  #60 x 11   Entered and Authorized by:   Lars Mage MD   Signed by:   Lars Mage  MD on 07/17/2009   Method used:   Electronically to        [facility] [phone]* (retail)       [address]       [place], [place]  [address]       Ph: [phone]       Fax: [phone]   RxID:   [id number]    Prevention & Chronic Care Immunizations   Influenza vaccine: refused  (11/01/2006)   Influenza vaccine deferral: Deferred  (07/17/2009)    Tetanus booster: Not documented   Td booster deferral: Deferred  (07/17/2009)    Pneumococcal vaccine: Not documented   Pneumococcal vaccine deferral: Deferred  (07/17/2009)    H. zoster vaccine: Not documented   H. zoster vaccine deferral: Deferred  (07/17/2009)  Colorectal Screening   Hemoccult: Not documented   Hemoccult action/deferral: Deferred  (07/17/2009)    Colonoscopy: Not documented   Colonoscopy action/deferral: Deferred  (07/17/2009)  Other Screening   PSA: Not documented   PSA action/deferral: Discussion deferred  (07/17/2009)   Smoking status: current  (07/17/2009)   Smoking cessation counseling: yes  (07/17/2009)  Diabetes Mellitus   HgbA1C: 7.6  (06/19/2009)    Eye exam: Not documented   Diabetic eye exam action/deferral: Deferred  (07/17/2009)    Foot exam: yes  (04/21/2008)   Foot exam action/deferral: Deferred   High risk foot: No  (04/21/2008)   Foot care education: Not documented    Urine microalbumin/creatinine ratio: 215.6  (03/23/2009)   Urine microalbumin action/deferral: Ordered    Diabetes flowsheet reviewed?: Yes   Progress toward A1C goal: Unchanged  Lipids  Total Cholesterol: 283  (03/23/2009)   Lipid panel action/deferral: Lipid Panel ordered   LDL: 224  (03/23/2009)   LDL Direct: Not documented   HDL: 29  (03/23/2009)   Triglycerides: 151  (03/23/2009)    SGOT (AST): 10  (03/23/2009)   BMP action: Ordered   SGPT (ALT): 9  (03/23/2009)   Alkaline phosphatase: 111  (03/23/2009)   Total bilirubin: 0.4  (03/23/2009)    Lipid flowsheet reviewed?: Yes   Progress  toward LDL goal: Unchanged  Hypertension   Last Blood Pressure: 173 / 85  (07/17/2009)   Serum creatinine: 1.69  (05/07/2009)   BMP action: Ordered   Serum potassium 4.4  (05/07/2009)    Hypertension flowsheet reviewed?: Yes   Progress toward BP goal: Deteriorated  Self-Management Support :   Personal Goals (by the next clinic visit) :     Personal A1C goal: 7  (05/29/2009)     Personal blood pressure goal: 130/80  (05/29/2009)     Personal LDL goal: 100  (05/29/2009)    Patient will work on the following items until the next clinic visit to reach self-care goals:     Medications and monitoring: take my medicines every day, check my blood sugar, bring all of my medications to every visit, examine my feet every day  (07/17/2009)     Eating: eat more vegetables, use fresh or frozen vegetables, eat foods that are low in salt, eat baked foods instead of fried foods, eat fruit for snacks and desserts, limit or avoid alcohol  (07/17/2009)     Activity: join a walking program  (05/29/2009)    Diabetes self-management support: Resources for patients handout, Written self-care plan  (07/17/2009)   Diabetes care plan printed   Last diabetes self-management training by diabetes educator: 05/08/2009    Hypertension self-management support: Resources for patients handout, Written self-care plan  (07/17/2009)   Hypertension self-care plan printed.    Lipid self-management support: Resources for patients handout, Written self-care plan  (07/17/2009)   Lipid self-care plan printed.      Resource handout printed.

## 2010-02-09 NOTE — Assessment & Plan Note (Signed)
Summary: EST-CK/FU/MEDS/CFB   Vital Signs:  Patient profile:   65 year old male Height:      66 inches Weight:      170.1 pounds BMI:     27.55 Temp:     97.4 degrees F oral Pulse rate:   79 / minute BP sitting:   198 / 95  (right arm)  Vitals Entered By: Filomena Jungling NT II (December 16, 2009 10:13 AM) CC: checkup, need refills Is Patient Diabetic? Yes Did you bring your meter with you today? No Pain Assessment Patient in pain? no      Nutritional Status BMI of 25 - 29 = overweight CBG Result 278  Have you ever been in a relationship where you felt threatened, hurt or afraid?No   Does patient need assistance? Functional Status Self care Ambulation Normal   Primary Care Provider:  Jaci Lazier MD  CC:  checkup and need refills.  History of Present Illness: Pt with pmh outlined below here on followup of his HTN. Still very poorly controlled today. Its unclear as to whether he is actually taking his HTN meds. He is able to name only 2 of his meds.  However, he states he has not been taking his Actos and GLipizide because he's been unable to afford them. He has no acute complaints today.   Current Medications (verified): 1)  Carvedilol 25 Mg Tabs (Carvedilol) .... Take 1 Tablet By Mouth Two Times A Day 2)  Glipizide 10 Mg Tabs (Glipizide) .... Take One Tab Two Times A Day To Lower Blood Sugars. 3)  Lasix 40 Mg Tabs (Furosemide) .... Take 1 Tablet By Mouth Once A Day 4)  Norvasc 10 Mg Tabs (Amlodipine Besylate) .... Take 1 Tablet By Mouth Once A Day 5)  Actos 45 Mg  Tabs (Pioglitazone Hcl) .... Take 1 Tablet By Mouth Once A Day 6)  Crestor 20 Mg Tabs (Rosuvastatin Calcium) .... Take 1 Tablet By Mouth Once A Day 7)  Freestyle Lite Test  Strp (Glucose Blood) .... Use To Check Blood Sugars 1-2 Times A Day 8)  Lancets 30g  Misc (Lancets) .... Use With Blood Sugar Check 1-2 Times A Day 9)  Lisinopril 10 Mg Tabs (Lisinopril) .... Take 1 Tablet By Mouth Once A Day  Allergies  (verified): 1)  ! Ace Inhibitors  Past History:  Past Medical History: Last updated: 11/16/2005 Diabetes mellitus, type II Hypertension Renal insufficiency Erectile dysfunction Hernia  Family History: Last updated: 03/23/2009 no early CAD  Social History: Last updated: 03/27/2008 Works for [employer].  Limited finances for which he frequently skips medications for a month or two.   Current Smoker Alcohol use-yes- occasional Drug use-no Plans to retire September 2010  Risk Factors: Alcohol Use: 0 (07/31/2009) Exercise: no (07/31/2009)  Risk Factors: Smoking Status: current (07/31/2009) Packs/Day: 1 (07/31/2009)  Review of Systems  The patient denies fever, chest pain, dyspnea on exertion, prolonged cough, abdominal pain, depression, and unusual weight change.    Physical Exam  General:  alert.   Head:  normocephalic and atraumatic.   Eyes:  vision grossly intact.  vision grossly intact.   Ears:  no external deformities.  no external deformities.   Nose:  no external deformity and no nasal discharge.  no external deformity and no nasal discharge.   Lungs:  normal respiratory effort, normal breath sounds, no crackles, and no wheezes.  normal respiratory effort, normal breath sounds, no crackles, and no wheezes.   Heart:  normal rate, regular rhythm,  and no murmur.  normal rate, regular rhythm, and no murmur.   Abdomen:  soft and non-tender.  soft and non-tender.   Extremities:  trace edema bilaterally Neurologic:  alert & oriented X3.  alert & oriented X3.   Skin:  color normal.  color normal.   Psych:  normally interactive.  normally interactive.     Impression & Recommendations:  Problem # 1:  HYPERTENSION (ICD-401.9) Assessment Deteriorated  Again, under very poor control 2/2 noncompliance. Discussed the risks of his behaviour and not taking responsibility for his health. States he understands that he may develop heart attack and stroke. He is also still smoking and  is not trying to quit.  Today I encouraged him to take his meds as prescribed, I highlited the hypertension medicines on his med list so he can memorize them and understand better how to dose them. He is to return in 2 weeks so we can recheck his blood pressure. If it remains poorly controlled, them we may consider restarting Clonidine.   His updated medication list for this problem includes:    Carvedilol 25 Mg Tabs (Carvedilol) .[name]... Take 1 tablet by mouth two times a day    Lasix 40 Mg Tabs (Furosemide) .[name]... Take 1 tablet by mouth once a day    Norvasc 10 Mg Tabs (Amlodipine besylate) .[name]... Take 1 tablet by mouth once a day    Lisinopril 10 Mg Tabs (Lisinopril) .[name]... Take 1 tablet by mouth once a day  BP today: 198/95 Prior BP: 197/94 (07/31/2009)  Labs Reviewed: K+: 3.6 (07/31/2009) Creat: : 1.92 (07/31/2009)   Chol: 194 (07/31/2009)   HDL: 41 (07/31/2009)   LDL: 136 (07/31/2009)   TG: 85 (07/31/2009)  His updated medication list for this problem includes:    Carvedilol 25 Mg Tabs (Carvedilol) .[name]... Take 1 tablet by mouth two times a day    Lasix 40 Mg Tabs (Furosemide) .[name]... Take 1 tablet by mouth once a day    Norvasc 10 Mg Tabs (Amlodipine besylate) .[name]... Take 1 tablet by mouth once a day    Lisinopril 10 Mg Tabs (Lisinopril) .[name]... Take 1 tablet by mouth once a day  Problem # 2:  PREVENTIVE HEALTH CARE (ICD-V70.0) Declined flu shot and pneumovax.   Problem # 3:  DIABETES MELLITUS, TYPE II (ICD-250.00)  AIC improved today. Foot exam up to date. States that he has an eye exam scheduled for January. WIll follow. States he hasn't taken his Actos and Glipizide because he hasn't been able to afford them, but he says he will be able to get his finances in order by friday.  His updated medication list for this problem includes:    Glipizide 10 Mg Tabs (Glipizide) .[name]... Take one tab two times a day to lower blood sugars.    Actos 45 Mg Tabs (Pioglitazone hcl) .[name]... Take 1 tablet by  mouth once a day    Lisinopril 10 Mg Tabs (Lisinopril) .[name]... Take 1 tablet by mouth once a day  Orders: T- Capillary Blood Glucose (16109) T-Hgb A1C (in-house) ([id number])  Labs Reviewed: Creat: 1.92 (07/31/2009)    Reviewed HgBA1c results: 7.2 (12/16/2009)  7.6 (06/19/2009)  His updated medication list for this problem includes:    Glipizide 10 Mg Tabs (Glipizide) .[name]... Take one tab two times a day to lower blood sugars.    Actos 45 Mg Tabs (Pioglitazone hcl) .[name]... Take 1 tablet by mouth once a day    Lisinopril 10 Mg Tabs (Lisinopril) .[name]... Take 1  tablet by mouth once a day  Problem # 4:  HYPERLIPIDEMIA (ICD-272.4)  No changes to med regimen today. Continue Crestor.  His updated medication list for this problem includes:    Crestor 20 Mg Tabs (Rosuvastatin calcium) .[name]... Take 1 tablet by mouth once a day  Labs Reviewed: SGOT: 16 (07/31/2009)   SGPT: 13 (07/31/2009)   HDL:41 (07/31/2009), 29 (03/23/2009)  LDL:136 (07/31/2009), 224 (03/23/2009)  Chol:194 (07/31/2009), 283 (03/23/2009)  Trig:85 (07/31/2009), 151 (03/23/2009)  His updated medication list for this problem includes:    Crestor 20 Mg Tabs (Rosuvastatin calcium) .[name]... Take 1 tablet by mouth once a day  Complete Medication List: 1)  Carvedilol 25 Mg Tabs (Carvedilol) .... Take 1 tablet by mouth two times a day 2)  Glipizide 10 Mg Tabs (Glipizide) .... Take one tab two times a day to lower blood sugars. 3)  Lasix 40 Mg Tabs (Furosemide) .... Take 1 tablet by mouth once a day 4)  Norvasc 10 Mg Tabs (Amlodipine besylate) .... Take 1 tablet by mouth once a day 5)  Actos 45 Mg Tabs (Pioglitazone hcl) .... Take 1 tablet by mouth once a day 6)  Crestor 20 Mg Tabs (Rosuvastatin calcium) .... Take 1 tablet by mouth once a day 7)  Freestyle Lite Test Strp (Glucose blood) .... Use to check blood sugars 1-2 times a day 8)  Lancets 30g Misc (Lancets) .... Use with blood sugar check 1-2 times a day 9)  Lisinopril 10 Mg Tabs  (Lisinopril) .... Take 1 tablet by mouth once a day  Other Orders: T-Hemoccult Card-Multiple (take home) ([id number])  Patient Instructions: 1)  Pls take ALL your medications exactly as prescribed. Your blood pressure is too high and can cause heart failure and stroke. 2)  Pls stop smoking. If you need [place] to help you quit, let us know. We have resources that can help. 3)  Come back in 2 weeks to see Dr. Narda Bonds so we can recheck your blood pressure. 4)  Please schedule a follow-up appointment in 2 weeks.   Orders Added: 1)  T- Capillary Blood Glucose [82948] 2)  T-Hgb A1C (in-house) [[id number]] 3)  T-Hemoccult Card-Multiple (take home) [[id number]] 4)  Est. Patient Level III [34742]    Prevention & Chronic Care Immunizations   Influenza vaccine: refused  (11/01/2006)   Influenza vaccine deferral: Refused  (12/16/2009)    Tetanus booster: Not documented   Td booster deferral: Refused  (12/16/2009)    Pneumococcal vaccine: Not documented   Pneumococcal vaccine deferral: Refused  (12/16/2009)    H. zoster vaccine: Not documented   H. zoster vaccine deferral: Deferred  (07/17/2009)  Colorectal Screening   Hemoccult: Not documented   Hemoccult action/deferral: Ordered  (12/16/2009)    Colonoscopy: Not documented   Colonoscopy action/deferral: Refused  (07/31/2009)  Other Screening   PSA: Not documented   PSA action/deferral: Discussed-PSA declined  (07/31/2009)   Smoking status: current  (07/31/2009)   Smoking cessation counseling: yes  (07/31/2009)  Diabetes Mellitus   HgbA1C: 7.2  (12/16/2009)    Eye exam: Not documented   Diabetic eye exam action/deferral: Ophthalmology referral  (12/16/2009)    Foot exam: yes  (07/31/2009)   Foot exam action/deferral: Do today   High risk foot: No  (04/21/2008)   Foot care education: Done  (07/31/2009)    Urine microalbumin/creatinine ratio: 215.6  (03/23/2009)   Urine microalbumin action/deferral: Ordered    Diabetes flowsheet  reviewed?: Yes   Progress toward A1C goal: Improved  Lipids  Total Cholesterol: 194  (07/31/2009)   Lipid panel action/deferral: Lipid Panel ordered   LDL: 136  (07/31/2009)   LDL Direct: Not documented   HDL: 41  (07/31/2009)   Triglycerides: 85  (07/31/2009)    SGOT (AST): 16  (07/31/2009)   BMP action: Ordered   SGPT (ALT): 13  (07/31/2009)   Alkaline phosphatase: 84  (07/31/2009)   Total bilirubin: 0.4  (07/31/2009)    Lipid flowsheet reviewed?: Yes   Progress toward LDL goal: Improved  Hypertension   Last Blood Pressure: 198 / 95  (12/16/2009)   Serum creatinine: 1.92  (07/31/2009)   BMP action: Ordered   Serum potassium 3.6  (07/31/2009)    Hypertension flowsheet reviewed?: Yes   Progress toward BP goal: Deteriorated  Self-Management Support :   Personal Goals (by the next clinic visit) :     Personal A1C goal: 7  (05/29/2009)     Personal blood pressure goal: 130/80  (05/29/2009)     Personal LDL goal: 100  (05/29/2009)    Patient will work on the following items until the next clinic visit to reach self-care goals:     Medications and monitoring: take my medicines every day, check my blood sugar, examine my feet every day  (12/16/2009)     Eating: drink diet soda or water instead of juice or soda, eat more vegetables, eat foods that are low in salt, eat baked foods instead of fried foods, eat fruit for snacks and desserts  (12/16/2009)     Activity: take a 30 minute walk every day  (12/16/2009)    Diabetes self-management support: Education handout, Resources for patients handout  (12/16/2009)   Diabetes education handout printed   Last diabetes self-management training by diabetes educator: 05/08/2009    Hypertension self-management support: Education handout, Resources for patients handout  (12/16/2009)   Hypertension education handout printed    Lipid self-management support: Education handout, Resources for patients handout  (12/16/2009)     Lipid  education handout printed      Resource handout printed.   Nursing Instructions: Provide Hemoccult cards with instructions (see order)      Laboratory Results   Blood Tests   Date/Time Received: December 16, 2009 10:26 AM  Date/Time Reported: Burke Keels  December 16, 2009 10:26 AM   HGBA1C: 7.2%   (Normal Range: Non-Diabetic - 3-6%   Control Diabetic - 6-8%) CBG Random:: 278mg /dL

## 2010-02-09 NOTE — Assessment & Plan Note (Signed)
Summary: diabetes/dmr  Is Patient Diabetic? Yes Did you bring your meter with you today? Yes CBG Device ID Freestyle Lite Comments patient self tested his CBG on his Freestyle Lite meter   Allergies: 1)  ! Ace Inhibitors   Complete Medication List: 1)  Coreg 12.5 Mg Tabs (Carvedilol) .... Take 1 tablet by mouth two times a day 2)  Glipizide 10 Mg Tabs (Glipizide) .... Take two tablets two times a day to lower blood sugars. 3)  Lasix 40 Mg Tabs (Furosemide) .... Take 1 tablet by mouth once a day 4)  Norvasc 10 Mg Tabs (Amlodipine besylate) .... Take 1 tablet by mouth once a day 5)  Actos 45 Mg Tabs (Pioglitazone hcl) .... Take 1 tablet by mouth once a day 6)  Viagra 100 Mg Tabs (Sildenafil citrate) .... Take one tablet by mouth one hour prior to intercourse 7)  Catapres 0.3 Mg Tabs (Clonidine hcl) .... Take 1 pill by mouth two times a day. 8)  Pravachol 40 Mg Tabs (Pravastatin sodium) .... Take one tablet daily to lower cholesterol. 9)  Nicoderm Cq 21 Mg/24hr Pt24 (Nicotine) .... Apply one patch on your skin daily. 10)  Freestyle Lite Test Strp (Glucose blood) .... Use to check blood sugars 1-2 times a day 11)  Lancets 30g Misc (Lancets) .... Use with blood sugar check 1-2 times a day  Other Orders: DSMT(Medicare) Individual, 30 Minutes ([id number])  Diabetes Self Management Training  PCP: Jason Coop MD Date diagnosed with diabetes: 01/10/1998 Diabetes Type: Type 2 insulin treated Other persons present: no Current smoking Status: current  Diabetes Medications:  Comments: Patient brought 2 meters with him today. He had on off brand and supplies that was sent to [facility] from [place] and a Freestyle meter that was sent to him form [employer]. He chose to learn how to use ht Freestyle meter nad was hoping to return the [employer] meter. He planned to try to get [employer] to send his Freestyle strips. I offreed ot help him as needed to obtain strips using his insurance.     Monitoring Self  monitoring blood glucose 1 time a day Name of Meter  Freestyle Lite  Recent Episodes of: Requiring Help from another person  Hyperglycemia : Yes Hypoglycemia: No      Nutrition assessment What beverages do you drink?  sweet tea form [employer] Do you read food labels?                                                                          No What do you look at?                                                                                                                 discussed  today, but suspect he will need seerla reeiws to understand Biggest challenge to eating healthy: Imbalance CHO Diabetes Disease Process  Discussed today Define diabetes in simple terms: Needs review/assistanceState diabetes is treated by meal plan-exercise-medication-monitoring-education: Needs review/assistance Medications  Nutritional Management Identify what foods most often affect blood glucose: Needs review/assistance    Verbalize importance of controlling food portions: Needs review/assistance   State importance of spacing and not omitting meals and snacks: Needs review/assistance   State changes planned for home meals/snacks: Needs review/assistance    Monitoring State purpose and frequency of monitoring BG-ketones-HgbA1C  : Needs review/assistance   Perform glucose monitoring/ketone testing and record results correctly: Demonstrates competency    State target blood glucose and HgbA1C goals: Needs review/assistance    Lifestyle changes:Goal setting and Problem solving Verbalize need for and frequency of health care follow-up: Needs review/assistanceDiabetes Management Education Done: 05/08/2009    BEHAVIORAL GOALS INITIAL Monitoring blood glucose levels daily: check blood sugars with new meter nd bring to all visits, call CDE if you need help obtainning strips and lancets    BEHAVIORAL GOAL FOLLOW UP  Goal attained      Diabetes Self Management Support: clinic staff and doctor  office appointments Follow-up: 4 weeks and  as patient needs/permits

## 2010-02-09 NOTE — Letter (Signed)
Summary: DIABETES TESTING SUPPLIES  DIABETES TESTING SUPPLIES   Imported By: Margie Billet 04/22/2009 13:59:20  _____________________________________________________________________  External Attachment:    Type:   Image     Comment:   External Document

## 2010-02-09 NOTE — Assessment & Plan Note (Signed)
Summary: FU VISIT/DS   Vital Signs:  Patient profile:   65 year old male Height:      66 inches (167.64 cm) Weight:      170.2 pounds (77.36 kg) BMI:     27.57 Temp:     97.5 degrees F (36.39 degrees C) oral Pulse rate:   71 / minute BP sitting:   163 / 82  (right arm)  Vitals Entered By: Stanton Kidney Ditzler RN (June 19, 2009 9:43 AM) Is Patient Diabetic? Yes Did you bring your meter with you today? No Pain Assessment Patient in pain? no      Nutritional Status BMI of 25 - 29 = overweight Nutritional Status Detail appetite good  Have you ever been in a relationship where you felt threatened, hurt or afraid?denies   Does patient need assistance? Functional Status Self care Ambulation Normal Comments Ck-up and refills on meds.   History of Present Illness: Ross Bauer comes for a f/u visit.   1. DM: He is checking his CBG two times a day. It is usu 190-200. He is eating better than last time. He is not so active and he actually gained 4 lbs. He states he cannnot walk because of his pack pain. He has seen an orthopedic MD and he was told he does not need surgery. This was in 2000.   2. HTN: He has not started taking lisinopril so far. He is taking his coreg, lasix, norvasc and clonidine.   3.  HL: He takes pravastatin.   4. Tobacco abuse: He started to smoke back 1 PPD. He is not using patch. He is more anxious.   Depression History:      The patient denies a depressed mood most of the day and a diminished interest in his usual daily activities.         Preventive Screening-Counseling & Management  Alcohol-Tobacco     Smoking Status: current     Smoking Cessation Counseling: yes     Packs/Day: 1     Year Started: 16  Caffeine-Diet-Exercise     Does Patient Exercise: no  Current Medications (verified): 1)  Coreg 25 Mg Tabs (Carvedilol) .... Take 1 Tablet By Mouth Two Times A Day 2)  Glipizide 10 Mg Tabs (Glipizide) .... Take Two Tablets Two Times A Day To Lower Blood  Sugars. 3)  Lasix 40 Mg Tabs (Furosemide) .... Take 1 Tablet By Mouth Once A Day 4)  Norvasc 10 Mg Tabs (Amlodipine Besylate) .... Take 1 Tablet By Mouth Once A Day 5)  Actos 45 Mg  Tabs (Pioglitazone Hcl) .... Take 1 Tablet By Mouth Once A Day 6)  Viagra 100 Mg  Tabs (Sildenafil Citrate) .... Take One Tablet By Mouth One Hour Prior To Intercourse 7)  Catapres 0.3 Mg Tabs (Clonidine Hcl) .... Take 1 Pill By Mouth Two Times A Day. 8)  Pravachol 40 Mg Tabs (Pravastatin Sodium) .... Take One Tablet Daily To Lower Cholesterol. 9)  Nicoderm Cq 21 Mg/24hr Pt24 (Nicotine) .... Apply One Patch On Your Skin Daily. 10)  Freestyle Lite Test  Strp (Glucose Blood) .... Use To Check Blood Sugars 1-2 Times A Day 11)  Lancets 30g  Misc (Lancets) .... Use With Blood Sugar Check 1-2 Times A Day 12)  Lisinopril 5 Mg Tabs (Lisinopril) .... Take 1 Pill By Mouth Daily.  Allergies: 1)  ! Ace Inhibitors  Review of Systems      See HPI  Physical Exam  Mouth:  pharynx pink and  moist.   Lungs:  normal breath sounds, no crackles, and no wheezes.   Heart:  normal rate, regular rhythm, no murmur, and no gallop.   Extremities:  trace left pedal edema and trace right pedal edema.     Impression & Recommendations:  Problem # 1:  HYPERLIPIDEMIA (ICD-272.4) WIll cont same for now.  His updated medication list for this problem includes:    Pravachol 40 Mg Tabs (Pravastatin sodium) .[name]... Take one tablet daily to lower cholesterol.  Labs Reviewed: SGOT: 10 (03/23/2009)   SGPT: 9 (03/23/2009)   HDL:29 (03/23/2009), 32 (02/26/2008)  EAV:409 (03/23/2009), 180 (02/26/2008)  Chol:283 (03/23/2009), 233 (02/26/2008)  Trig:151 (03/23/2009), 106 (02/26/2008)  Problem # 2:  TOBACCO USER (ICD-305.1) Discussed at length again today about smoking cessation. He was smoking less last time than today and he started to smoke more as he was stressed out that he has to pay 800 $ for a/c for his car. He said nicotine patch was working,  but he has not used it soon. I encouraged him to start using it and also as needed nicotine gum for cravings.  His updated medication list for this problem includes:    Nicoderm Cq 21 Mg/24hr Pt24 (Nicotine) .[name]... Apply one patch on your skin daily.  Problem # 3:  HYPERTENSION (ICD-401.9) BP higher than last time. He has not started taking lisinopril. I increased teh dose of his coreg and encouraged him to start taking lisnopril as well. Will f/u in 2 wks.   His updated medication list for this problem includes:    Coreg 25 Mg Tabs (Carvedilol) .[name]... Take 1 tablet by mouth two times a day    Lasix 40 Mg Tabs (Furosemide) .[name]... Take 1 tablet by mouth once a day    Norvasc 10 Mg Tabs (Amlodipine besylate) .[name]... Take 1 tablet by mouth once a day    Catapres 0.3 Mg Tabs (Clonidine hcl) .[name]... Take 1 pill by mouth two times a day.    Lisinopril 5 Mg Tabs (Lisinopril) .[name]... Take 1 pill by mouth daily.  BP today: 163/82 Prior BP: 146/77 (05/29/2009)  Labs Reviewed: K+: 4.4 (05/07/2009) Creat: : 1.69 (05/07/2009)   Chol: 283 (03/23/2009)   HDL: 29 (03/23/2009)   LDL: 224 (03/23/2009)   TG: 151 (03/23/2009)  Problem # 4:  DIABETES MELLITUS, TYPE II (ICD-250.00) A1c dropped from >14 to 7.6. He again did not bring his meter today. His CBG is 190-200 on average. Plan is to cont same for now. If his CBG and A1c starts to further improve and especially because he has RI, we can decrease the dose of glipizide from 20 two times a day to 10 two times a day. Dr. Clent Ridges started him on this increased dose as he was not ready to take insulin and his A1c was very high and I continued it.  His updated medication list for this problem includes:    Glipizide 10 Mg Tabs (Glipizide) .[name]... Take two tablets two times a day to lower blood sugars.    Actos 45 Mg Tabs (Pioglitazone hcl) .[name]... Take 1 tablet by mouth once a day    Lisinopril 5 Mg Tabs (Lisinopril) .[name]... Take 1 pill by mouth daily.  Orders: T- Capillary  Blood Glucose (81191) T-Hgb A1C (in-house) (47829FA)  Labs Reviewed: Creat: 1.69 (05/07/2009)    Reviewed HgBA1c results: 7.6 (06/19/2009)  >14.0 (03/23/2009)  Problem # 5:  ANEMIA, NORMOCYTIC (ICD-285.9) WIll check CBC.  Orders: T-Lipid Profile ([id number]) T-CBC No Diff ([id number])  Problem # 6:  RENAL INSUFFICIENCY (ICD-588.9) Will check CMET on next appt, if he starts to take his lisinopril from today.   Complete Medication List: 1)  Coreg 25 Mg Tabs (Carvedilol) .... Take 1 tablet by mouth two times a day 2)  Glipizide 10 Mg Tabs (Glipizide) .... Take two tablets two times a day to lower blood sugars. 3)  Lasix 40 Mg Tabs (Furosemide) .... Take 1 tablet by mouth once a day 4)  Norvasc 10 Mg Tabs (Amlodipine besylate) .... Take 1 tablet by mouth once a day 5)  Actos 45 Mg Tabs (Pioglitazone hcl) .... Take 1 tablet by mouth once a day 6)  Viagra 100 Mg Tabs (Sildenafil citrate) .... Take one tablet by mouth one hour prior to intercourse 7)  Catapres 0.3 Mg Tabs (Clonidine hcl) .... Take 1 pill by mouth two times a day. 8)  Pravachol 40 Mg Tabs (Pravastatin sodium) .... Take one tablet daily to lower cholesterol. 9)  Nicoderm Cq 21 Mg/24hr Pt24 (Nicotine) .... Apply one patch on your skin daily. 10)  Freestyle Lite Test Strp (Glucose blood) .... Use to check blood sugars 1-2 times a day 11)  Lancets 30g Misc (Lancets) .... Use with blood sugar check 1-2 times a day 12)  Lisinopril 5 Mg Tabs (Lisinopril) .... Take 1 pill by mouth daily.  Patient Instructions: 1)  Please schedule a follow-up appointment in 2 weeks. 2)  Limit your Sodium (Salt) to less than 2 grams a day(slightly less than 1/2 a teaspoon) to prevent fluid retention, swelling, or worsening of symptoms. 3)  Tobacco is very bad for your health and your loved ones! You Should stop smoking!. 4)  Stop Smoking Tips: Choose a Quit date. Cut down before the Quit date. decide what you will do as a substitute when you feel  the urge to smoke(gum,toothpick,exercise). 5)  It is important that you exercise regularly at least 20 minutes 5 times a week. If you develop chest pain, have severe difficulty breathing, or feel very tired , stop exercising immediately and seek medical attention. 6)  You need to lose weight. Consider a lower calorie diet and regular exercise.  7)  Check your blood sugars regularly. If your readings are usually above : or below 70 you should contact our office. 8)  It is important that your Diabetic A1c level is checked every 3 months. 9)  See your eye doctor yearly to check for diabetic eye damage. 10)  Check your feet each night for sore areas, calluses or signs of infection. 11)  Check your Blood Pressure regularly. If it is above: you should make an appointment. Prescriptions: COREG 25 MG TABS (CARVEDILOL) Take 1 tablet by mouth two times a day  #30 x 0   Entered and Authorized by:   Jason Coop MD   Signed by:   Jason Coop MD on 06/19/2009   Method used:   Print then Give to Patient   RxID:   [id number] CATAPRES 0.3 MG TABS (CLONIDINE HCL) take 1 pill by mouth two times a day.  #60 x 1   Entered and Authorized by:   Jason Coop MD   Signed by:   Jason Coop MD on 06/19/2009   Method used:   Print then Give to Patient   RxID:   [id number] ACTOS 45 MG  TABS (PIOGLITAZONE HCL) Take 1 tablet by mouth once a day  #30 x 0   Entered and Authorized by:   Jason Coop MD  Signed by:   Jason Coop MD on 06/19/2009   Method used:   Print then Give to Patient   RxID:   [id number]  Process Orders Check Orders Results:     [facility]: ABN not required for this insurance Tests Sent for requisitioning (June 19, 2009 3:03 PM):     06/19/2009: [facility] -- T-Lipid Profile [id number] (signed)     06/19/2009: [facility] -- T-CBC No Diff [[id number]] (signed)    Prevention & Chronic  Care Immunizations   Influenza vaccine: refused  (11/01/2006)    Tetanus booster: Not documented    Pneumococcal vaccine: Not documented    H. zoster vaccine: Not documented  Colorectal Screening   Hemoccult: Not documented   Hemoccult action/deferral: Ordered  (03/23/2009)    Colonoscopy: Not documented   Colonoscopy action/deferral: GI referral  (03/23/2009)  Other Screening   PSA: Not documented   Smoking status: current  (06/19/2009)   Smoking cessation counseling: yes  (06/19/2009)  Diabetes Mellitus   HgbA1C: 7.6  (06/19/2009)    Eye exam: Not documented   Diabetic eye exam action/deferral: Ophthalmology referral  (03/23/2009)    Foot exam: yes  (04/21/2008)   High risk foot: No  (04/21/2008)   Foot care education: Not documented    Urine microalbumin/creatinine ratio: 215.6  (03/23/2009)   Urine microalbumin action/deferral: Ordered    Diabetes flowsheet reviewed?: Yes   Progress toward A1C goal: Improved  Lipids   Total Cholesterol: 283  (03/23/2009)   Lipid panel action/deferral: Lipid Panel ordered   LDL: 224  (03/23/2009)   LDL Direct: Not documented   HDL: 29  (03/23/2009)   Triglycerides: 151  (03/23/2009)    SGOT (AST): 10  (03/23/2009)   BMP action: Ordered   SGPT (ALT): 9  (03/23/2009)   Alkaline phosphatase: 111  (03/23/2009)   Total bilirubin: 0.4  (03/23/2009)    Lipid flowsheet reviewed?: Yes   Progress toward LDL goal: Unchanged  Hypertension   Last Blood Pressure: 163 / 82  (06/19/2009)   Serum creatinine: 1.69  (05/07/2009)   BMP action: Ordered   Serum potassium 4.4  (05/07/2009)    Hypertension flowsheet reviewed?: Yes   Progress toward BP goal: Deteriorated  Self-Management Support :   Personal Goals (by the next clinic visit) :     Personal A1C goal: 7  (05/29/2009)     Personal blood pressure goal: 130/80  (05/29/2009)     Personal LDL goal: 100  (05/29/2009)    Patient will work on the following items until the next  clinic visit to reach self-care goals:     Medications and monitoring: take my medicines every day, bring all of my medications to every visit, examine my feet every day  (06/19/2009)     Eating: eat more vegetables, use fresh or frozen vegetables, eat foods that are low in salt, eat baked foods instead of fried foods, limit or avoid alcohol  (06/19/2009)     Activity: join a walking program  (05/29/2009)    Diabetes self-management support: Written self-care plan, Education handout, Resources for patients handout  (06/19/2009)   Diabetes care plan printed   Diabetes education handout printed   Last diabetes self-management training by diabetes educator: 05/08/2009    Hypertension self-management support: Written self-care plan, Education handout, Resources for patients handout  (06/19/2009)   Hypertension self-care plan printed.   Hypertension education handout printed    Lipid self-management support: Written self-care plan, Education handout, Resources for  patients handout  (06/19/2009)   Lipid self-care plan printed.   Lipid education handout printed      Resource handout printed.  Laboratory Results   Blood Tests   Date/Time Received: June 19, 2009 10:06 AM Date/Time Reported: Alric Quan  June 19, 2009 10:06 AM   HGBA1C: 7.6%   (Normal Range: Non-Diabetic - 3-6%   Control Diabetic - 6-8%) CBG Fasting:: 192mg /dL      Process Orders Check Orders Results:     [facility]: ABN not required for this insurance Tests Sent for requisitioning (June 19, 2009 3:03 PM):     06/19/2009: [facility] -- T-Lipid Profile [phone] (signed)     06/19/2009: [facility] -- T-CBC No Diff [[id number]] (signed)

## 2010-02-09 NOTE — Assessment & Plan Note (Signed)
Summary: EST-CK/FU/MEDS/CFB   Vital Signs:  Patient profile:   65 year old male Height:      66 inches (167.64 cm) Weight:      160.02 pounds (72.74 kg) BMI:     25.92 Temp:     97.9 degrees F (36.61 degrees C) oral Pulse rate:   79 / minute BP sitting:   168 / 83  (right arm)  Vitals Entered By: Angelina Ok RN (May 07, 2009 3:26 PM) Is Patient Diabetic? Yes Did you bring your meter with you today? No Pain Assessment Patient in pain? no      Nutritional Status BMI of 25 - 29 = overweight CBG Result 204  Have you ever been in a relationship where you felt threatened, hurt or afraid?No   Does patient need assistance? Functional Status Self care Ambulation Normal Comments Wants pharmacy changed to [facility] on [address]. Swelling in feet.  Notices when he is up all day. No pain just the swelling.   History of Present Illness: Mr. Jolliff is a 65 yo male with PMH as outlined in the EMR comes today for a f/u visit.   1. HTN: He is a taking all the medications listed here in EMR, but dose is not known as he didnot bring his meter. He c/o some left leg swelling but not calf pain.   2. DM: He is still ont ready for insulin, but he is taking his actos and glipizide.   3. Tobacco abuse: He smokes 1 pack per day.   4. HL: He is taking pravastatin.   Depression History:      The patient denies a depressed mood most of the day and a diminished interest in his usual daily activities.         Preventive Screening-Counseling & Management  Alcohol-Tobacco     Smoking Status: current     Smoking Cessation Counseling: yes     Packs/Day: 1     Year Started: 16  Current Medications (verified): 1)  Coreg 12.5 Mg Tabs (Carvedilol) .... Take 1 Tablet By Mouth Two Times A Day 2)  Glipizide 10 Mg Tabs (Glipizide) .... Take Two Tablets Two Times A Day To Lower Blood Sugars. 3)  Lasix 40 Mg Tabs (Furosemide) .... Take 1 Tablet By Mouth Once A Day 4)  Norvasc 10 Mg Tabs (Amlodipine  Besylate) .... Take 1 Tablet By Mouth Once A Day 5)  Actos 45 Mg  Tabs (Pioglitazone Hcl) .... Take 1 Tablet By Mouth Once A Day 6)  Viagra 100 Mg  Tabs (Sildenafil Citrate) .... Take One Tablet By Mouth One Hour Prior To Intercourse 7)  Catapres 0.3 Mg Tabs (Clonidine Hcl) .... Take 1 Pill By Mouth Two Times A Day. 8)  Pravachol 40 Mg Tabs (Pravastatin Sodium) .... Take One Tablet Daily To Lower Cholesterol. 9)  Truetrack Test  Strp (Glucose Blood) .... Use With Blood Sugar Check 10)  Nicoderm Cq 21 Mg/24hr Pt24 (Nicotine) .... Apply One Patch On Your Skin Daily.  Allergies: 1)  ! Ace Inhibitors  Review of Systems      See HPI  Physical Exam  General:  alert.   Mouth:  pharynx pink and moist.   Lungs:  normal breath sounds, no crackles, and no wheezes.   Heart:  normal rate, regular rhythm, and no murmur.   Abdomen:  soft, non-tender, and normal bowel sounds.   Extremities:  trace left pedal edema and trace right pedal edema.   Neurologic:  alert &  oriented X3.     Impression & Recommendations:  Problem # 1:  HYPERLIPIDEMIA (ICD-272.4) Recently started on pravastatin, will cont same and check FLP in a month.  His updated medication list for this problem includes:    Pravachol 40 Mg Tabs (Pravastatin sodium) .[name]... Take one tablet daily to lower cholesterol.  Orders: T-TSH [phone])  Labs Reviewed: SGOT: 10 (03/23/2009)   SGPT: 9 (03/23/2009)   HDL:29 (03/23/2009), 32 (02/26/2008)  JYN:829 (03/23/2009), 180 (02/26/2008)  Chol:283 (03/23/2009), 233 (02/26/2008)  Trig:151 (03/23/2009), 106 (02/26/2008)  Problem # 2:  TOBACCO USER (ICD-305.1) Discussed about why he should quit and what are the obstacles for quitting. He wants to try nicotine patch. I asked him to use nicotine gum in addition, to help with his craving. He will continue to use the patches at least for 3 months after he stops quitting in order to avoid relapse.  His updated medication list for this problem  includes:    Nicoderm Cq 21 Mg/24hr Pt24 (Nicotine) .[name]... Apply one patch on your skin daily.  Problem # 3:  RENAL INSUFFICIENCY (ICD-588.9) Cr. close to baseline.   Orders: T-Basic Metabolic Panel [id number]) T-TSH [phone])  Problem # 4:  HYPERTENSION (ICD-401.9) He did not bring his medications today. His BP is somewhat improved, but not at goal. I will increase clonidine and asked him to bring all of his medications in 2 wks.  His updated medication list for this problem includes:    Coreg 12.5 Mg Tabs (Carvedilol) .[name]... Take 1 tablet by mouth two times a day    Lasix 40 Mg Tabs (Furosemide) .[name]... Take 1 tablet by mouth once a day    Norvasc 10 Mg Tabs (Amlodipine besylate) .[name]... Take 1 tablet by mouth once a day    Catapres 0.3 Mg Tabs (Clonidine hcl) .[name]... Take 1 pill by mouth two times a day.  BP today: 168/83 Prior BP: 192/98 (03/23/2009)  Labs Reviewed: K+: 3.9 (03/23/2009) Creat: : 1.39 (03/23/2009)   Chol: 283 (03/23/2009)   HDL: 29 (03/23/2009)   LDL: 224 (03/23/2009)   TG: 151 (03/23/2009)  Problem # 5:  DIABETES MELLITUS, TYPE II (ICD-250.00) He is not ready for insulin yet, because there is a chance that he will be driving public vehicles and he is not allowed to drive if he is on insulin. I noted his glipizide was increased on last visit to 20 mg two times a day. There are 2 options at this moment.  1. Start insulin, which he is not ready yet.  2. Cont same medicine and be aggressive on diet and exercise and reassess in a month. If he does not make a significant improvement, only choice left is insulin. He wants to go this route. He will see DR today. Optho referral and foot exam on next appt.   His updated medication list for this problem includes:    Glipizide 10 Mg Tabs (Glipizide) .[name]... Take two tablets two times a day to lower blood sugars.    Actos 45 Mg Tabs (Pioglitazone hcl) .[name]... Take 1 tablet by mouth once a day  Orders: Capillary Blood Glucose/CBG  ([id number])  Labs Reviewed: Creat: 1.39 (03/23/2009)    Reviewed HgBA1c results: >14.0 (03/23/2009)  7.9 (04/21/2008)  Complete Medication List: 1)  Coreg 12.5 Mg Tabs (Carvedilol) .... Take 1 tablet by mouth two times a day 2)  Glipizide 10 Mg Tabs (Glipizide) .... Take two tablets two times a day to lower blood sugars. 3)  Lasix 40 Mg Tabs (  Furosemide) .... Take 1 tablet by mouth once a day 4)  Norvasc 10 Mg Tabs (Amlodipine besylate) .... Take 1 tablet by mouth once a day 5)  Actos 45 Mg Tabs (Pioglitazone hcl) .... Take 1 tablet by mouth once a day 6)  Viagra 100 Mg Tabs (Sildenafil citrate) .... Take one tablet by mouth one hour prior to intercourse 7)  Catapres 0.3 Mg Tabs (Clonidine hcl) .... Take 1 pill by mouth two times a day. 8)  Pravachol 40 Mg Tabs (Pravastatin sodium) .... Take one tablet daily to lower cholesterol. 9)  Truetrack Test Strp (Glucose blood) .... Use with blood sugar check 10)  Nicoderm Cq 21 Mg/24hr Pt24 (Nicotine) .... Apply one patch on your skin daily.  Patient Instructions: 1)  Please schedule a follow-up appointment in 2 weeks. 2)  Limit your Sodium (Salt) to less than 2 grams a day(slightly less than 1/2 a teaspoon) to prevent fluid retention, swelling, or worsening of symptoms. 3)  Tobacco is very bad for your health and your loved ones! You Should stop smoking!. 4)  Stop Smoking Tips: Choose a Quit date. Cut down before the Quit date. decide what you will do as a substitute when you feel the urge to smoke(gum,toothpick,exercise). 5)  It is important that you exercise regularly at least 20 minutes 5 times a week. If you develop chest pain, have severe difficulty breathing, or feel very tired , stop exercising immediately and seek medical attention. 6)  You need to lose weight. Consider a lower calorie diet and regular exercise.  7)  Check your blood sugars regularly. If your readings are usually above : or below 70 you should contact our office. 8)  It is  important that your Diabetic A1c level is checked every 3 months. 9)  See your eye doctor yearly to check for diabetic eye damage. 10)  Check your feet each night for sore areas, calluses or signs of infection. Prescriptions: NICODERM CQ 21 MG/24HR PT24 (NICOTINE) apply one patch on your skin daily.  #30 x 3   Entered and Authorized by:   Jason Coop MD   Signed by:   Jason Coop MD on 05/07/2009   Method used:   Electronically to        [facility] [phone]* (retail)       [address]       [place], [place]  [address]       Ph: [phone]       Fax: [phone]   RxID:   [id number]   Prevention & Chronic Care Immunizations   Influenza vaccine: refused  (11/01/2006)    Tetanus booster: Not documented    Pneumococcal vaccine: Not documented    H. zoster vaccine: Not documented  Colorectal Screening   Hemoccult: Not documented   Hemoccult action/deferral: Ordered  (03/23/2009)    Colonoscopy: Not documented   Colonoscopy action/deferral: GI referral  (03/23/2009)  Other Screening   PSA: Not documented   Smoking status: current  (05/07/2009)   Smoking cessation counseling: yes  (05/07/2009)  Diabetes Mellitus   HgbA1C: >14.0  (03/23/2009)    Eye exam: Not documented   Diabetic eye exam action/deferral: Ophthalmology referral  (03/23/2009)    Foot exam: yes  (04/21/2008)   High risk foot: No  (04/21/2008)   Foot care education: Not documented    Urine microalbumin/creatinine ratio: 215.6  (03/23/2009)   Urine microalbumin action/deferral: Ordered  Lipids   Total Cholesterol: 283  (03/23/2009)   Lipid panel  action/deferral: Lipid Panel ordered   LDL: 224  (03/23/2009)   LDL Direct: Not documented   HDL: 29  (03/23/2009)   Triglycerides: 151  (03/23/2009)    SGOT (AST): 10  (03/23/2009)   BMP action: Ordered   SGPT (ALT): 9  (03/23/2009)   Alkaline phosphatase: 111  (03/23/2009)   Total bilirubin: 0.4   (03/23/2009)  Hypertension   Last Blood Pressure: 168 / 83  (05/07/2009)   Serum creatinine: 1.39  (03/23/2009)   BMP action: Ordered   Serum potassium 3.9  (03/23/2009)  Self-Management Support :    Patient will work on the following items until the next clinic visit to reach self-care goals:     Medications and monitoring: take my medicines every day, check my blood sugar, bring all of my medications to every visit, examine my feet every day  (05/07/2009)     Eating: drink diet soda or water instead of juice or soda, eat more vegetables, eat foods that are low in salt, eat baked foods instead of fried foods, eat fruit for snacks and desserts, limit or avoid alcohol  (05/07/2009)     Activity: take a 30 minute walk every day  (05/07/2009)    Diabetes self-management support: Written self-care plan, Education handout, Pre-printed educational material  (05/07/2009)   Diabetes care plan printed   Diabetes education handout printed    Hypertension self-management support: Written self-care plan, Education handout, Pre-printed educational material  (05/07/2009)   Hypertension self-care plan printed.   Hypertension education handout printed    Lipid self-management support: Written self-care plan, Education handout, Pre-printed educational material  (05/07/2009)   Lipid self-care plan printed.   Lipid education handout printed  Process Orders Check Orders Results:     [facility]: ABN not required for this insurance Tests Sent for requisitioning (May 08, 2009 2:18 PM):     05/07/2009: [facility] -- T-Basic Metabolic Panel [phone] (signed)     05/07/2009: [facility] -- T-TSH [id number] (signed)     Vital Signs:  Patient profile:   65 year old male Height:      66 inches (167.64 cm) Weight:      160.02 pounds (72.74 kg) BMI:     25.92 Temp:     97.9 degrees F (36.61 degrees C) oral Pulse rate:   79 / minute BP sitting:    168 / 83  (right arm)  Vitals Entered By: Angelina Ok RN (May 07, 2009 3:26 PM)      Process Orders Check Orders Results:     [facility]: ABN not required for this insurance Tests Sent for requisitioning (May 08, 2009 2:18 PM):     05/07/2009: [facility] -- T-Basic Metabolic Panel [phone] (signed)     05/07/2009: [facility] -- T-TSH [phone] (signed)

## 2010-02-09 NOTE — Progress Notes (Signed)
Summary: Prior Authorization  Phone Note Outgoing Call   Call placed by: Angelina Ok RN,  April 24, 2009 11:36 AM Call placed to: Insurer Summary of Call: Call for PriorAuthorization.  Information given.  Reference number [id number].  Determination to be made within 72 hours. Angelina Ok RN  April 24, 2009 11:37 AM  Initial call taken by: Angelina Ok RN,  April 24, 2009 11:37 AM     Appended Document: Prior Authorization Prior Authorization for Actos has been approved until 04/24/2011.  [facility] made awared.  Appended Document: Prior Authorization [facility]  [address] made awared of [place] of [employer].

## 2010-02-09 NOTE — Assessment & Plan Note (Signed)
Summary: acute-2 week f/u per Dr. Michelene Gardener)   Vital Signs:  Patient profile:   65 year old male Height:      66 inches (167.64 cm) Weight:      168.1 pounds (76.41 kg) BMI:     27.23 Temp:     98.2 degrees F oral Pulse rate:   83 / minute BP sitting:   197 / 94  (left arm)  Vitals Entered By: Chinita Pester RN (July 31, 2009 9:48 AM) CC: F/U visit- decreased leg edema but still has some left foot edema. Is Patient Diabetic? Yes Did you bring your meter with you today? No Pain Assessment Patient in pain? no      Nutritional Status BMI of 25 - 29 = overweight CBG Result 231  Have you ever been in a relationship where you felt threatened, hurt or afraid?No   Does patient need assistance? Functional Status Self care Ambulation Normal   Diabetic Foot Exam Last Podiatry Exam Date: 07/31/2009  Foot Inspection Is there a history of a foot ulcer?              No Is there a foot ulcer now?              No Can the patient see the bottom of their feet?          Yes Are the shoes appropriate in style and fit?          No Is there swelling or an abnormal foot shape?          Yes Are the toenails long?                No Are the toenails thick?                No Are the toenails ingrown?              No Is there heavy callous build-up?              No  Diabetic Foot Care Education Patient educated on appropriate care of diabetic feet.  Pulse Check          Right Foot          Left Foot Dorsalis Pedis:        diminished            diminished Comments: Feet edema L>R. Pulses were not appreciated due to edema.   10-g (5.07) Semmes-Weinstein Monofilament Test Performed by: Chinita Pester RN          Right Foot          Left Foot Visual Inspection     normal         normal Test Control      normal         normal Site 1         normal         normal Site 2         normal         normal Site 3         normal         normal Site 4         normal         normal Site 5          normal         normal Site 6         normal  normal Site 7         normal         normal Site 8         normal         normal Site 9         normal         normal  Impression      normal         normal   Primary Care Provider:  Jaci Lazier MD  CC:  F/U visit- decreased leg edema but still has some left foot edema..  History of Present Illness: Patient is a 65 year old man with PMh asdescribed in EMR is here today for followup appointment ofhisleg swelling and his BP.  His BP is again raised today. He is not taking his BP meds as prescribed. I told him that his 79 y risk of having a heart attack at this rate is very high and he understands the concern. Agrees to take his meds at time from now on. Follow up in 1 week.  HLD: last checked it was >200. I will change his statin to crestor today. If he is unable to afford (copay too high) He will call in and will let us know.  Swelling: Slightly improved but still present.   DM: Last Hba1c 7.6.  Smoking: Patient was counseled on smoking cessation strategies including medications and behavior modification options. Not interested at this time.     Depression History:      The patient denies a depressed mood most of the day and a diminished interest in his usual daily activities.         Preventive Screening-Counseling & Management  Alcohol-Tobacco     Alcohol drinks/day: 0     Smoking Status: current     Smoking Cessation Counseling: yes     Packs/Day: 1     Year Started: 16  Caffeine-Diet-Exercise     Does Patient Exercise: no  Problems Prior to Update: 1)  Leg Edema, Bilateral  (ICD-782.3) 2)  Anemia, Normocytic  (ICD-285.9) 3)  Headache  (ICD-784.0) 4)  Hyperlipidemia  (ICD-272.4) 5)  Back Pain  (ICD-724.5) 6)  Tobacco User  (ICD-305.1) 7)  Hernia  (ICD-553.9) 8)  Erectile Dysfunction  (ICD-302.72) 9)  Renal Insufficiency  (ICD-588.9) 10)  Hypertension  (ICD-401.9) 11)  Diabetes Mellitus, Type II   (ICD-250.00)  Medications Prior to Update: 1)  Carvedilol 12.5 Mg Tabs (Carvedilol) .... Take 1 Tablet By Mouth Two Times A Day 2)  Glipizide 10 Mg Tabs (Glipizide) .... Take One Tab Two Times A Day To Lower Blood Sugars. 3)  Lasix 40 Mg Tabs (Furosemide) .... Take 1 Tablet By Mouth Once A Day 4)  Norvasc 10 Mg Tabs (Amlodipine Besylate) .... Take 1 Tablet By Mouth Once A Day 5)  Actos 45 Mg  Tabs (Pioglitazone Hcl) .... Take 1 Tablet By Mouth Once A Day 6)  Catapres 0.3 Mg Tabs (Clonidine Hcl) .... Take 1 Pill By Mouth Two Times A Day. 7)  Pravachol 40 Mg Tabs (Pravastatin Sodium) .... Take One Tablet Daily To Lower Cholesterol. 8)  Freestyle Lite Test  Strp (Glucose Blood) .... Use To Check Blood Sugars 1-2 Times A Day 9)  Lancets 30g  Misc (Lancets) .... Use With Blood Sugar Check 1-2 Times A Day 10)  Lisinopril 10 Mg Tabs (Lisinopril) .... Take 1 Tablet By Mouth Once A Day  Current Medications (verified): 1)  Carvedilol 25 Mg Tabs (Carvedilol) .Marland KitchenMarland KitchenMarland Kitchen  Take 1 Tablet By Mouth Two Times A Day 2)  Glipizide 10 Mg Tabs (Glipizide) .... Take One Tab Two Times A Day To Lower Blood Sugars. 3)  Lasix 40 Mg Tabs (Furosemide) .... Take 1 Tablet By Mouth Once A Day 4)  Norvasc 10 Mg Tabs (Amlodipine Besylate) .... Take 1 Tablet By Mouth Once A Day 5)  Actos 45 Mg  Tabs (Pioglitazone Hcl) .... Take 1 Tablet By Mouth Once A Day 6)  Crestor 20 Mg Tabs (Rosuvastatin Calcium) .... Take 1 Tablet By Mouth Once A Day 7)  Freestyle Lite Test  Strp (Glucose Blood) .... Use To Check Blood Sugars 1-2 Times A Day 8)  Lancets 30g  Misc (Lancets) .... Use With Blood Sugar Check 1-2 Times A Day 9)  Lisinopril 10 Mg Tabs (Lisinopril) .... Take 1 Tablet By Mouth Once A Day  Allergies (verified): 1)  ! Ace Inhibitors  Past History:  Past Medical History: Last updated: 11/16/2005 Diabetes mellitus, type II Hypertension Renal insufficiency Erectile dysfunction Hernia  Family History: Last updated:  03/23/2009 no early CAD  Social History: Last updated: 03/27/2008 Works for [employer].  Limited finances for which he frequently skips medications for a month or two.   Current Smoker Alcohol use-yes- occasional Drug use-no Plans to retire September 2010  Risk Factors: Alcohol Use: 0 (07/31/2009) Exercise: no (07/31/2009)  Risk Factors: Smoking Status: current (07/31/2009) Packs/Day: 1 (07/31/2009)  Review of Systems      See HPI  Physical Exam  Additional Exam:  Gen: AOx3, in no acute distress Eyes: PERRL, EOMI ENT:MMM, No erythema noted in posterior pharynx Neck: No JVD, No LAP Chest: CTAB with  good respiratory effort CVS: regular rhythmic rate, NO M/R/G, S1 S2 normal Abdo: soft,ND, BS+x4, Non tender and No hepatosplenomegaly EXT: 2+ pitting edema present b/l upto knees Neuro: Non focal, gait is normal Skin: no rashes noted.   Diabetes Management Exam:    Foot Exam (with socks and/or shoes not present):       Sensory-Monofilament:          Left foot: normal          Right foot: normal   Impression & Recommendations:  Problem # 1:  LEG EDEMA, BILATERAL (ICD-782.3) Assessment Unchanged Improved slightly but still 2+ bilaterally. I discussed leg elevation and other strategies including taking his lasix regularly as some of the suggestions today. Will also readjust his HTN meds as discussed below and follow up in 1 week if still edematous, will get 2decho for evaluation of left vent status. His updated medication list for this problem includes:    Lasix 40 Mg Tabs (Furosemide) .[name]... Take 1 tablet by mouth once a day  Orders: T-CMP with Estimated GFR ([id number]) T-Urinalysis ([id number])  Discussed elevation of the legs, use of compression stockings, sodium restiction, and medication use.   Problem # 2:  HYPERTENSION (ICD-401.9) Assessment: Deteriorated Blood pressure was elevated today and he said that he did not take his meds today morning. He was also little  confused about what meds to take. Did not bring his meds with him today. I will d/c clonidine as plan from Dr Aundria Rud and will increase his coreg to 25 two times a day. Follow up in 1 week and Bmet at that time. If Bp still elevated, may go up on Lasix and Lisinopril. Will check Cmet, EKG and UA today for baseline workup.  The following medications were removed from the medication list:    Catapres 0.3 Mg  Tabs (Clonidine hcl) .[name]... Take 1 pill by mouth two times a day. His updated medication list for this problem includes:    Carvedilol 25 Mg Tabs (Carvedilol) .[name]... Take 1 tablet by mouth two times a day    Lasix 40 Mg Tabs (Furosemide) .[name]... Take 1 tablet by mouth once a day    Norvasc 10 Mg Tabs (Amlodipine besylate) .[name]... Take 1 tablet by mouth once a day    Lisinopril 10 Mg Tabs (Lisinopril) .[name]... Take 1 tablet by mouth once a day  Orders: T-Urinalysis ([id number]) 12 Lead EKG (12 Lead EKG)  BP today: 197/94 Prior BP: 173/85 (07/17/2009)  Labs Reviewed: K+: 4.4 (05/07/2009) Creat: : 1.69 (05/07/2009)   Chol: 283 (03/23/2009)   HDL: 29 (03/23/2009)   LDL: 224 (03/23/2009)   TG: 151 (03/23/2009)  Problem # 3:  HYPERLIPIDEMIA (ICD-272.4) Assessment: Unchanged Will check Lipid paneltoday and change his statin to Crestor.  he is at high risk for CAD and this is to bring his modifiable risk factors under control. Hepatic panel at follow up.  His updated medication list for this problem includes:    Crestor 20 Mg Tabs (Rosuvastatin calcium) .[name]... Take 1 tablet by mouth once a day  Labs Reviewed: SGOT: 10 (03/23/2009)   SGPT: 9 (03/23/2009)   HDL:29 (03/23/2009), 32 (02/26/2008)  JYN:829 (03/23/2009), 180 (02/26/2008)  Chol:283 (03/23/2009), 233 (02/26/2008)  Trig:151 (03/23/2009), 106 (02/26/2008)  Problem # 4:  TOBACCO USER (ICD-305.1) Assessment: Unchanged  Encouraged smoking cessation and discussed different methods for smoking cessation. Patient was counseled on smoking  cessation strategies including medications and behavior modification options. He is not interested at this time and said that he has will poer to stop it on his own.  Problem # 5:  RENAL INSUFFICIENCY (ICD-588.9) Assessment: Unchanged Multiple causes including DM, uncontrolled HTN.  Already on Lisinopril. Plan to control HTN and DM along with continuing lisinopril.  Orders: T-CMP with Estimated GFR ([id number])  Problem # 6:  Preventive Health Care (ICD-V70.0) Assessment: Comment Only  Reviewed preventive care protocols, scheduled due services, and updated immunizations.  Complete Medication List: 1)  Carvedilol 25 Mg Tabs (Carvedilol) .... Take 1 tablet by mouth two times a day 2)  Glipizide 10 Mg Tabs (Glipizide) .... Take one tab two times a day to lower blood sugars. 3)  Lasix 40 Mg Tabs (Furosemide) .... Take 1 tablet by mouth once a day 4)  Norvasc 10 Mg Tabs (Amlodipine besylate) .... Take 1 tablet by mouth once a day 5)  Actos 45 Mg Tabs (Pioglitazone hcl) .... Take 1 tablet by mouth once a day 6)  Crestor 20 Mg Tabs (Rosuvastatin calcium) .... Take 1 tablet by mouth once a day 7)  Freestyle Lite Test Strp (Glucose blood) .... Use to check blood sugars 1-2 times a day 8)  Lancets 30g Misc (Lancets) .... Use with blood sugar check 1-2 times a day 9)  Lisinopril 10 Mg Tabs (Lisinopril) .... Take 1 tablet by mouth once a day  Other Orders: Capillary Blood Glucose/CBG ([id number])  Patient Instructions: 1)  Please schedule a follow-up appointment in 2 weeks. 2)  Limit your Sodium (Salt). 3)  Tobacco is very bad for your health and your loved ones! You Should stop smoking!. 4)  Stop Smoking Tips: Choose a Quit date. Cut down before the Quit date. decide what you will do as a substitute when you feel the urge to smoke(gum,toothpick,exercise). 5)  It is important that you exercise regularly at least 20 minutes 5  times a week. If you develop chest pain, have severe difficulty breathing,  or feel very tired , stop exercising immediately and seek medical attention. 6)  You need to lose weight. Consider a lower calorie diet and regular exercise.  7)  Take an Aspirin every day. 8)  Check your blood sugars regularly. If your readings are usually above : 200 or below 70 you should contact our office. 9)  Check your Blood Pressure regularly. If it is above: 160/100 you should make an appointment. Prescriptions: CARVEDILOL 25 MG TABS (CARVEDILOL) Take 1 tablet by mouth two times a day  #62 x 11   Entered and Authorized by:   Lars Mage MD   Signed by:   Lars Mage MD on 07/31/2009   Method used:   Electronically to        [id number]* (retail)       [address]       [place], [place]  [address]       Ph: [phone]       Fax: [phone]   RxID:   [id number] CRESTOR 20 MG TABS (ROSUVASTATIN CALCIUM) Take 1 tablet by mouth once a day  #31 x 11   Entered and Authorized by:   Lars Mage MD   Signed by:   Lars Mage MD on 07/31/2009   Method used:   Electronically to        [facility] [phone]* (retail)       [address]       [place], [place]  [address]       Ph: [phone]       Fax: [phone]   RxID:   [id number]   Prevention & Chronic Care Immunizations   Influenza vaccine: refused  (11/01/2006)   Influenza vaccine deferral: Deferred  (07/31/2009)    Tetanus booster: Not documented   Td booster deferral: Deferred  (07/31/2009)    Pneumococcal vaccine: Not documented   Pneumococcal vaccine deferral: Deferred  (07/17/2009)    H. zoster vaccine: Not documented   H. zoster vaccine deferral: Deferred  (07/17/2009)  Colorectal Screening   Hemoccult: Not documented   Hemoccult action/deferral: Deferred  (07/31/2009)    Colonoscopy: Not documented   Colonoscopy action/deferral: Refused  (07/31/2009)  Other Screening   PSA: Not documented   PSA action/deferral: Discussed-PSA declined  (07/31/2009)   Smoking status: current   (07/31/2009)   Smoking cessation counseling: yes  (07/31/2009)  Diabetes Mellitus   HgbA1C: 7.6  (06/19/2009)    Eye exam: Not documented   Diabetic eye exam action/deferral: Deferred  (07/17/2009)    Foot exam: yes  (07/31/2009)   Foot exam action/deferral: Do today   High risk foot: No  (04/21/2008)   Foot care education: Done  (07/31/2009)    Urine microalbumin/creatinine ratio: 215.6  (03/23/2009)   Urine microalbumin action/deferral: Ordered    Diabetes flowsheet reviewed?: Yes   Progress toward A1C goal: Improved  Lipids   Total Cholesterol: 283  (03/23/2009)   Lipid panel action/deferral: Lipid Panel ordered   LDL: 224  (03/23/2009)   LDL Direct: Not documented   HDL: 29  (03/23/2009)   Triglycerides: 151  (03/23/2009)    SGOT (AST): 10  (03/23/2009)   BMP action: Ordered   SGPT (ALT): 9  (03/23/2009)   Alkaline phosphatase: 111  (03/23/2009)   Total bilirubin: 0.4  (03/23/2009)    Lipid flowsheet reviewed?: Yes   Progress toward LDL goal: Deteriorated  Hypertension  Last Blood Pressure: 197 / 94  (07/31/2009)   Serum creatinine: 1.69  (05/07/2009)   BMP action: Ordered   Serum potassium 4.4  (05/07/2009)    Hypertension flowsheet reviewed?: Yes   Progress toward BP goal: Deteriorated  Self-Management Support :   Personal Goals (by the next clinic visit) :     Personal A1C goal: 7  (05/29/2009)     Personal blood pressure goal: 130/80  (05/29/2009)     Personal LDL goal: 100  (05/29/2009)    Patient will work on the following items until the next clinic visit to reach self-care goals:     Medications and monitoring: take my medicines every day, bring all of my medications to every visit, examine my feet every day  (07/31/2009)     Eating: drink diet soda or water instead of juice or soda, eat more vegetables, use fresh or frozen vegetables, eat foods that are low in salt, eat baked foods instead of fried foods, eat fruit for snacks and desserts   (07/31/2009)     Activity: take a 30 minute walk every day  (07/31/2009)    Diabetes self-management support: Written self-care plan  (07/31/2009)   Diabetes care plan printed   Last diabetes self-management training by diabetes educator: 05/08/2009    Hypertension self-management support: Written self-care plan  (07/31/2009)   Hypertension self-care plan printed.    Lipid self-management support: Written self-care plan  (07/31/2009)   Lipid self-care plan printed.   Nursing Instructions: Diabetic foot exam today   Process Orders Check Orders Results:     [facility]: ABN not required for this insurance Tests Sent for requisitioning (August 02, 2009 6:12 PM):     07/31/2009: [facility] -- T-CMP with Estimated GFR [[id number]] (signed)     07/31/2009: [facility] -- T-Urinalysis [[id number]] (signed)    Process Orders Check Orders Results:     [facility]: ABN not required for this insurance Tests Sent for requisitioning (August 02, 2009 6:12 PM):     07/31/2009: [facility] -- T-CMP with Estimated GFR [[id number]] (signed)     07/31/2009: [facility] -- T-Urinalysis [[id number]] (signed)

## 2010-02-09 NOTE — Assessment & Plan Note (Signed)
Summary: DM TRAINING/VS  Is Patient Diabetic? Yes Did you bring your meter with you today? No   Allergies: 1)  ! Ace Inhibitors   Complete Medication List: 1)  Coreg 12.5 Mg Tabs (Carvedilol) .... Take 1 tablet by mouth two times a day 2)  Glipizide 10 Mg Tabs (Glipizide) .... Take two tablets two times a day to lower blood sugars. 3)  Lasix 40 Mg Tabs (Furosemide) .... Take 1 tablet by mouth once a day 4)  Norvasc 10 Mg Tabs (Amlodipine besylate) .... Take 1 tablet by mouth once a day 5)  Actos 45 Mg Tabs (Pioglitazone hcl) .... Take 1 tablet by mouth once a day 6)  Viagra 100 Mg Tabs (Sildenafil citrate) .... Take one tablet by mouth one hour prior to intercourse 7)  Catapres 0.2 Mg Tabs (Clonidine hcl) .... Take 1 tablet by mouth twice a day 8)  Celebrex 200 Mg Caps (Celecoxib) .... Take 1 capsule by mouth once to twice a day as needed for pain 9)  Pravachol 40 Mg Tabs (Pravastatin sodium) .... Take one tablet daily to lower cholesterol. 10)  Truetrack Test Strp (Glucose blood) .... Use with blood sugar check  Other Orders: DSMT(Medicare) Individual, 30 Minutes ([id number])  Diabetes Self Management Training  PCP: Jason Coop MD Date diagnosed with diabetes: 01/10/1998 Diabetes Type: Type 2 insulin treated Other persons present: no Current smoking Status: current  Potential Barriers  Needs review/assistance  Diabetes Medications:  Comments: wants assistacne with meters- has two, not sure if he should use [employer]. schedules another appintment for tomnorrow when he has both meters and we can train him to use them and help him know what the best way fo rhim ot get a continuing supply of suuplies will be.      Diabetes Disease Process  Discussed today  Medications Describe safe needle/lancet disposal: Needs review/assistance    Nutritional Management  Monitoring State purpose and frequency of monitoring BG-ketones-HgbA1C  : Needs review/assistance   Perform  glucose monitoring/ketone testing and record results correctly: Needs review/assistance    State target blood glucose and HgbA1C goals: Needs review/assistance    Complications State the causes-signs and symptoms and prevention of Hyperglycemia: Needs review/assistanceExplain proper treatment of hyperglycemia: Needs review/assistance Exercise  Lifestyle changes:Goal setting and Problem solving State benefits of making appropriate lifestyle changes: Needs review/assistanceIdentify lifestyle behaviors that need to change: Needs review/assistanceIdentify risk factors that interfere with health: Needs review/assistance Psychosocial Adjustment State three common feelings that might be experienced when learning to cope with diabetes: Needs review/assistanceIdentify two methods to cope with these feelings: Needs review/assistanceIdentify how stress affects diabetes & two sources of stress: Needs review/assistanceName two ways of obtaining support from family/friends: Needs review/assistanceDiabetes Management Education Done: 05/07/2009    BEHAVIORAL GOALS INITIAL Monitoring blood glucose levels daily: bring meters wth you tomorrow        Diabetes Self Management Support: to assess tomorrow Follow-up:tomorrw and then as patient needs/permits

## 2010-02-09 NOTE — Progress Notes (Signed)
Summary: Medication  Phone Note Call from Patient   Caller: Patient Call For: Ross Coop MD Summary of Call: Pt said that he was supposed to get an increase in the dosing of his Clonidine.  Pt uses the [facility] on [address].  Pt was advised that we will call him about  the changes and the prescription. Angelina Ok RN  May 08, 2009 10:42 AM  Initial call taken by: Angelina Ok RN,  May 08, 2009 10:42 AM  Follow-up for Phone Call       Follow-up by: Ross Coop MD,  May 08, 2009 1:33 PM    New/Updated Medications: CATAPRES 0.3 MG TABS (CLONIDINE HCL) take 1 pill by mouth two times a day. Prescriptions: CATAPRES 0.3 MG TABS (CLONIDINE HCL) take 1 pill by mouth two times a day.  #60 x 1   Entered and Authorized by:   Ross Coop MD   Signed by:   Ross Coop MD on 05/11/2009   Method used:   Electronically to        [id number] (retail)       [address]       [place], [place]  [address]       Ph: [phone]       Fax: [phone]   RxID:   [id number]

## 2010-02-09 NOTE — Assessment & Plan Note (Signed)
Summary: FU MEDS/DS   Vital Signs:  Patient profile:   65 year old male Height:      66 inches (167.64 cm) Weight:      160 pounds (72.73 kg) BMI:     25.92 Temp:     97.8 degrees F (36.56 degrees C) oral Pulse rate:   79 / minute BP sitting:   192 / 98  (right arm)  Vitals Entered By: Angelina Ok RN (March 23, 2009 2:22 PM) Is Patient Diabetic? Yes Did you bring your meter with you today? No Pain Assessment Patient in pain? no      Nutritional Status BMI of 25 - 29 = overweight CBG Result 290  Have you ever been in a relationship where you felt threatened, hurt or afraid?No   Does patient need assistance? Functional Status Self care Ambulation Normal Comments Needs refills on meds.   History of Present Illness: 63yom with pmh outlined in chart.  He is here for check up so that he can keep receiving refills from clinic.  He has no complaints.  He reports that he has not had any clonidine for one month, no actos for 3 months.  He has been taking cinamon instead.  No chest pain, HA, SOA.  Depression History:      The patient denies a depressed mood most of the day and a diminished interest in his usual daily activities.         Medications Prior to Update: 1)  Coreg 12.5 Mg Tabs (Carvedilol) .... Take 1 Tablet By Mouth Two Times A Day 2)  Glipizide 10 Mg Tabs (Glipizide) .... Take 1 Tablet By Mouth Two Times A Day 3)  Lasix 40 Mg Tabs (Furosemide) .... Take 1 Tablet By Mouth Once A Day 4)  Norvasc 10 Mg Tabs (Amlodipine Besylate) .... Take 1 Tablet By Mouth Once A Day 5)  Actos 45 Mg  Tabs (Pioglitazone Hcl) .... Take 1 Tablet By Mouth Once A Day 6)  Viagra 100 Mg  Tabs (Sildenafil Citrate) .... Take One Tablet By Mouth One Hour Prior To Intercourse 7)  Catapres 0.2 Mg Tabs (Clonidine Hcl) .... Take 1 Tablet By Mouth Twice A Day 8)  Celebrex 200 Mg  Caps (Celecoxib) .... Take 1 Capsule By Mouth Once To Twice A Day As Needed For Pain 9)  Pravachol 20 Mg Tabs (Pravastatin  Sodium) .... Take 1 Tablet By Mouth Once A Day 10)  Truetrack Test  Strp (Glucose Blood) .... Use With Blood Sugar Check  Allergies: 1)  ! Ace Inhibitors  Family History: no early CAD  Review of Systems       per hpi  Physical Exam  General:  alert and well-developed.   Head:  normocephalic and atraumatic.   Eyes:  vision grossly intact, pupils equal, pupils round, and pupils reactive to light.   Nose:  no external deformity.   Mouth:  pharynx pink and moist and no erythema.   Lungs:  normal respiratory effort and R base crackles.   Heart:  normal rate, regular rhythm, and no murmur.   Pulses:  2+ Extremities:  no edema Neurologic:  alert & oriented X3, cranial nerves II-XII intact, and strength normal in all extremities.   Psych:  Oriented X3, memory intact for recent and remote, normally interactive, good eye contact, and not anxious appearing.     Impression & Recommendations:  Problem # 1:  HYPERLIPIDEMIA (ICD-272.4) Assessment Unchanged lipids and cmet today.  His updated medication list for  this problem includes:    Pravachol 20 Mg Tabs (Pravastatin sodium) .[name]... Take 1 tablet by mouth once a day  Orders: T-Lipid Profile [phone])  Labs Reviewed: SGOT: 11 (04/21/2008)   SGPT: 10 (04/21/2008)   HDL:32 (02/26/2008), 29 (11/24/2006)  LDL:180 (02/26/2008), 167 (11/24/2006)  Chol:233 (02/26/2008), 220 (11/24/2006)  Trig:106 (02/26/2008), 121 (11/24/2006)  Problem # 2:  HYPERTENSION (ICD-401.9) will go back on clonidine. rtc in 2 weeks to recheck.  His updated medication list for this problem includes:    Coreg 12.5 Mg Tabs (Carvedilol) .[name]... Take 1 tablet by mouth two times a day    Lasix 40 Mg Tabs (Furosemide) .[name]... Take 1 tablet by mouth once a day    Norvasc 10 Mg Tabs (Amlodipine besylate) .[name]... Take 1 tablet by mouth once a day    Catapres 0.2 Mg Tabs (Clonidine hcl) .[name]... Take 1 tablet by mouth twice a day  BP today: 192/98 Prior BP: 145/85  (06/25/2008)  Labs Reviewed: K+: 3.9 (05/27/2008) Creat: : 1.67 (05/27/2008)   Chol: 233 (02/26/2008)   HDL: 32 (02/26/2008)   LDL: 180 (02/26/2008)   TG: 106 (02/26/2008)  Problem # 3:  DIABETES MELLITUS, TYPE II (ICD-250.00) A1C is >14 from 7.9 about a year ago. has not been taking actos.  does not exercise or monitor diet at all. has been taking glipizide. renal function will not allow for metformin.  he does not want to use insulin because of injections. for now will increase glipizide to 20 mg two times a day. refer to DR. strongly encouraged insulin initiation. He is not receptive to starting insulin. Does not want injections.  I offered a once a day injection.  He refused.  I let him know that he is currently in danger of heart attack, kidney failure, stroke and blindness.  He is tuning me out. he can start actos again, but I doubt it will do anything.  needs to check cbg's, hopefully DR can give him a meter.   His updated medication list for this problem includes:    Glipizide 10 Mg Tabs (Glipizide) .[name]... Take two tablets two times a day to lower blood sugars.    Actos 45 Mg Tabs (Pioglitazone hcl) .[name]... Take 1 tablet by mouth once a day  Orders: T- Capillary Blood Glucose (56213) T-Hgb A1C (in-house) ([id number]) Ophthalmology Referral (Ophthalmology) T-Urine Microalbumin w/creat. ratio [phone]) Diabetic Clinic Referral (Diabetic)  Labs Reviewed: Creat: 1.67 (05/27/2008)    Reviewed HgBA1c results: 7.9 (04/21/2008)  8.3 (02/26/2008)  Problem # 4:  TOBACCO USER (ICD-305.1) not interested in stopping smoking.  Complete Medication List: 1)  Coreg 12.5 Mg Tabs (Carvedilol) .... Take 1 tablet by mouth two times a day 2)  Glipizide 10 Mg Tabs (Glipizide) .... Take two tablets two times a day to lower blood sugars. 3)  Lasix 40 Mg Tabs (Furosemide) .... Take 1 tablet by mouth once a day 4)  Norvasc 10 Mg Tabs (Amlodipine besylate) .... Take 1 tablet by mouth once a  day 5)  Actos 45 Mg Tabs (Pioglitazone hcl) .... Take 1 tablet by mouth once a day 6)  Viagra 100 Mg Tabs (Sildenafil citrate) .... Take one tablet by mouth one hour prior to intercourse 7)  Catapres 0.2 Mg Tabs (Clonidine hcl) .... Take 1 tablet by mouth twice a day 8)  Celebrex 200 Mg Caps (Celecoxib) .... Take 1 capsule by mouth once to twice a day as needed for pain 9)  Pravachol 20 Mg Tabs (Pravastatin sodium) .... Take  1 tablet by mouth once a day 10)  Truetrack Test Strp (Glucose blood) .... Use with blood sugar check  Other Orders: T-Comprehensive Metabolic Panel ([id number]) T-Hemoccult Card-Multiple (take home) ([id number])  Patient Instructions: 1)  Please schedule a follow-up appointment in 2 weeks. 2)  Please start taking clonidine and actos again. 3)  You will have an appointment with our Diabetes Educator to discuss blood sugars. 4)  You are in a very dangerous position and need to consider starting insulin to avoid complications of diabetes such as kidney disease, heart attack and blindness. Prescriptions: GLIPIZIDE 10 MG TABS (GLIPIZIDE) Take two tablets two times a day to lower blood sugars.  #120mg  x 3   Entered and Authorized by:   Elby Showers MD   Signed by:   Elby Showers MD on 03/23/2009   Method used:   [facility]        [facility]  [address] [phone][facility] (retail)       [address]       [place], [place]  [address]       Ph: [phone]       Fax: [phone]   RxID:   [id number]   Prevention & Chronic Care Immunizations   Influenza vaccine: refused  (11/01/2006)    Tetanus booster: Not documented    Pneumococcal vaccine: Not documented    H. zoster vaccine: Not documented  Colorectal Screening   Hemoccult: Not documented   Hemoccult action/deferral: Ordered  (03/23/2009)    Colonoscopy: Not documented   Colonoscopy action/deferral: GI referral  (03/23/2009)  Other Screening   PSA: Not documented   Smoking  status: current  (06/25/2008)   Smoking cessation counseling: yes  (06/25/2008)  Diabetes Mellitus   HgbA1C: >14.0  (03/23/2009)    Eye exam: Not documented   Diabetic eye exam action/deferral: Ophthalmology referral  (03/23/2009)    Foot exam: yes  (04/21/2008)   High risk foot: No  (04/21/2008)   Foot care education: Not documented    Urine microalbumin/creatinine ratio: 7.6  (04/21/2008)   Urine microalbumin action/deferral: Ordered   Progress toward A1C goal: Unchanged  Lipids   Total Cholesterol: 233  (02/26/2008)   Lipid panel action/deferral: Lipid Panel ordered   LDL: 180  (02/26/2008)   LDL Direct: Not documented   HDL: 32  (02/26/2008)   Triglycerides: 106  (02/26/2008)    SGOT (AST): 11  (04/21/2008)   BMP action: Ordered   SGPT (ALT): 10  (04/21/2008) CMP ordered    Alkaline phosphatase: 73  (04/21/2008)   Total bilirubin: 0.4  (04/21/2008)    Lipid flowsheet reviewed?: Yes   Progress toward LDL goal: Unchanged  Hypertension   Last Blood Pressure: 192 / 98  (03/23/2009)   Serum creatinine: 1.67  (05/27/2008)   BMP action: Ordered   Serum potassium 3.9  (05/27/2008) CMP ordered     Hypertension flowsheet reviewed?: Yes   Progress toward BP goal: Deteriorated  Self-Management Support :    Patient will work on the following items until the next clinic visit to reach self-care goals:     Medications and monitoring: take my medicines every day, check my blood sugar, bring all of my medications to every visit, examine my feet every day  (03/23/2009)     Eating: drink diet soda or water instead of juice or soda, eat more vegetables, eat foods that are low in salt, eat baked foods instead of fried foods, limit or  avoid alcohol  (03/23/2009)     Activity: take a 30 minute walk every day  (03/23/2009)    Diabetes self-management support: CBG self-monitoring log, Written self-care plan  (03/23/2009)   Diabetes care plan printed   Referred for diabetes self-mgmt  training.    Hypertension self-management support: Written self-care plan, Education handout  (03/23/2009)   Hypertension self-care plan printed.   Hypertension education handout printed    Lipid self-management support: Written self-care plan, Education handout  (03/23/2009)   Lipid self-care plan printed.   Lipid education handout printed   Nursing Instructions: GI referral for screening colonoscopy (see order) Refer for screening diabetic eye exam (see order) Provide Hemoccult cards with instructions (see order)   Process Orders Check Orders Results:     [facility]: ABN not required for this insurance Tests Sent for requisitioning (March 23, 2009 3:37 PM):     03/23/2009: [facility] -- T-Urine Microalbumin w/creat. ratio [[id number]] (signed)     03/23/2009: [facility] -- T-Lipid Profile [phone] (signed)     03/23/2009: [facility] -- T-Comprehensive Metabolic Panel [id number] (signed)    Laboratory Results   Blood Tests   Date/Time Received: March 23, 2009 3:01 PM Date/Time Reported: Alric Quan  March 23, 2009 3:01 PM   HGBA1C: >14.0%   (Normal Range: Non-Diabetic - 3-6%   Control Diabetic - 6-8%) CBG Random:: 290mg /dL      Appended Document: FU MEDS/DS    Clinical Lists Changes  Medications: Rx of COREG 12.5 MG TABS (CARVEDILOL) Take 1 tablet by mouth two times a day;  #60 x 1;  Signed;  Entered by: Elby Showers MD;  Authorized by: Elby Showers MD;  Method used: [id number] [address], [place], [place], [place]  [address], Ph: [phone], Fax: [phone] Rx of LASIX 40 MG TABS (FUROSEMIDE) Take 1 tablet by mouth once a day;  #30 x 1;  Signed;  Entered by: Elby Showers MD;  Authorized by: Elby Showers MD;  Method used: Electronically to [id number]*, [address], [place], [place], [place]   [address], Ph: [phone], Fax: [phone] Rx of NORVASC 10 MG TABS (AMLODIPINE BESYLATE) Take 1 tablet by mouth once a day;  #30 x 1;  Signed;  Entered by: Elby Showers MD;  Authorized by: Elby Showers MD;  Method used: Electronically to [id number]*, [address] [place], [place], [place]  [address], Ph: [phone], Fax: [phone] Rx of ACTOS 45 MG  TABS (PIOGLITAZONE HCL) Take 1 tablet by mouth once a day;  #30 x 1;  Signed;  Entered by: Elby Showers MD;  Authorized by: Elby Showers MD;  Method used: Electronically to [facility] #[id number]*, [address], [place], [place], [place]  [address], Ph: [phone], Fax: [phone] Rx of CATAPRES 0.2 MG TABS (CLONIDINE HCL) Take 1 tablet by mouth twice a day;  #60 x 1;  Signed;  Entered by: Elby Showers MD;  Authorized by: Elby Showers MD;  Method used: Electronically to [id number]*, [address] [place], [place], [place]  [address], Ph: [phone], Fax: [phone]    Prescriptions: CATAPRES 0.2 MG TABS (CLONIDINE HCL) Take 1 tablet by mouth twice a day  #60 x 1   Entered and Authorized by:   Elby Showers MD   Signed by:   Elby Showers MD on 03/23/2009  Method used:   [facility]        [facility]  [address] [id number] (retail)       [address]       [place], [place]  [address]       Ph: [phone]       Fax: [phone]   RxID:   [id number] ACTOS 45 MG  TABS (PIOGLITAZONE HCL) Take 1 tablet by mouth once a day  #30 x 1   Entered and Authorized by:   Elby Showers MD   Signed by:   Elby Showers MD on 03/23/2009   Method used:   [facility]ronically [facility]        [facility]  [address] [phone]* (retail)       [address]       [place], [place]  [address]       Ph: [phone]       Fax: [phone]   RxID:   [id number] NORVASC 10 MG TABS (AMLODIPINE BESYLATE) Take 1 tablet by mouth once a day  #30 x 1    Entered and Authorized by:   Elby Showers MD   Signed by:   Elby Showers MD on 03/23/2009   Method used:   [facility]        [facility]  [address] [phone]* (retail)       [address]       [place], [place]  [address]       Ph: [phone]       Fax: [phone]   RxID:   [id number] LASIX 40 MG TABS (FUROSEMIDE) Take 1 tablet by mouth once a day  #30 x 1   Entered and Authorized by:   Elby Showers MD   Signed by:   Elby Showers MD on 03/23/2009   Method used:   [facility]ically [facility]        [facility]  [address] [phone]* (retail)       [address]       [place], [place]  [address]       Ph: [phone]       Fax: [phone]   RxID:   [id number] COREG 12.5 MG TABS (CARVEDILOL) Take 1 tablet by mouth two times a day  #60 x 1   Entered and Authorized by:   Elby Showers MD   Signed by:   Elby Showers MD on 03/23/2009   Method used:   [facility]ronically [facility]        [facility]  [address] [phone]* (retail)       [address]       [place], [place]  [address]       Ph: [phone]       Fax: [phone]   RxID:   [id number]

## 2010-02-09 NOTE — Assessment & Plan Note (Signed)
Summary: FU VISIT/DS   Vital Signs:  Patient profile:   65 year old male Height:      66 inches (167.64 cm) Weight:      165.1 pounds (75.05 kg) BMI:     26.74 Temp:     97.6 degrees F (36.44 degrees C) oral Pulse rate:   61 / minute BP sitting:   146 / 77  (left arm) Cuff size:   regular  Vitals Entered By: Cynda Familia Duncan Dull) (May 29, 2009 9:33 AM) CC: routine f/u Is Patient Diabetic? Yes Did you bring your meter with you today? No Nutritional Status BMI of > 30 = obese  Have you ever been in a relationship where you felt threatened, hurt or afraid?No   Does patient need assistance? Functional Status Self care Ambulation Normal   CC:  routine f/u.  History of Present Illness: Ross Bauer is a 65 yo man with PMH as outlined in the EMR comes today for a f/u visit.   1. DM: He didnot bring his meter. He is checking his CBG in AM only and it runs like 230-240. Before last appt he is not sure what was his home CBG running like. He is taking glipizide and actos as recommended. He met D. Riley on last appt. He is following diet that are OK for diabetics. He doesnot eat fried food, and does not eat that much sweet. He walks some but not much.   2. Tobacco abuse: He was smoking 1 pack per day and now he smokes 4-6 ciggarettes a day. He is using the patch, but does not smoke while using the patch. He has not tried nicotine gum.   3. HTN: He is using lasix, amlodipine, clonidine and coreg regularly.   4. HL: He is taking pravastatin regularly.   Preventive Screening-Counseling & Management  Alcohol-Tobacco     Smoking Status: current     Smoking Cessation Counseling: yes     Packs/Day: 1     Year Started: 16  Current Medications (verified): 1)  Coreg 12.5 Mg Tabs (Carvedilol) .... Take 1 Tablet By Mouth Two Times A Day 2)  Glipizide 10 Mg Tabs (Glipizide) .... Take Two Tablets Two Times A Day To Lower Blood Sugars. 3)  Lasix 40 Mg Tabs (Furosemide) .... Take 1 Tablet By  Mouth Once A Day 4)  Norvasc 10 Mg Tabs (Amlodipine Besylate) .... Take 1 Tablet By Mouth Once A Day 5)  Actos 45 Mg  Tabs (Pioglitazone Hcl) .... Take 1 Tablet By Mouth Once A Day 6)  Viagra 100 Mg  Tabs (Sildenafil Citrate) .... Take One Tablet By Mouth One Hour Prior To Intercourse 7)  Catapres 0.3 Mg Tabs (Clonidine Hcl) .... Take 1 Pill By Mouth Two Times A Day. 8)  Pravachol 40 Mg Tabs (Pravastatin Sodium) .... Take One Tablet Daily To Lower Cholesterol. 9)  Nicoderm Cq 21 Mg/24hr Pt24 (Nicotine) .... Apply One Patch On Your Skin Daily. 10)  Freestyle Lite Test  Strp (Glucose Blood) .... Use To Check Blood Sugars 1-2 Times A Day 11)  Lancets 30g  Misc (Lancets) .... Use With Blood Sugar Check 1-2 Times A Day 12)  Lisinopril 5 Mg Tabs (Lisinopril) .... Take 1 Pill By Mouth Daily.  Allergies: 1)  ! Ace Inhibitors  Review of Systems      See HPI  Physical Exam  General:  alert.   Mouth:  pharynx pink and moist.   Lungs:  normal breath sounds, no crackles, and  no wheezes.   Heart:  normal rate, regular rhythm, and no murmur.   Extremities:  trace left pedal edema and trace right pedal edema.   Neurologic:  alert & oriented X3.     Impression & Recommendations:  Problem # 1:  HYPERLIPIDEMIA (ICD-272.4) He started to take 40 mg of pravachol just recently. WIll recheck FLP on next appt.   His updated medication list for this problem includes:    Pravachol 40 Mg Tabs (Pravastatin sodium) .[name]... Take one tablet daily to lower cholesterol.  Labs Reviewed: SGOT: 10 (03/23/2009)   SGPT: 9 (03/23/2009)   HDL:29 (03/23/2009), 32 (02/26/2008)  KGM:010 (03/23/2009), 180 (02/26/2008)  Chol:283 (03/23/2009), 233 (02/26/2008)  Trig:151 (03/23/2009), 106 (02/26/2008)  Problem # 2:  TOBACCO USER (ICD-305.1) He started to cut back on smoking after he used patch. I encouraged him to use gum to deal with the craving.  His updated medication list for this problem includes:    Nicoderm Cq 21  Mg/24hr Pt24 (Nicotine) .[name]... Apply one patch on your skin daily.  Problem # 3:  RENAL INSUFFICIENCY (ICD-588.9) Will follow.   Problem # 4:  HYPERTENSION (ICD-401.9) BP improved, but still higher than the goal. I will start him on lisinopril and follow renal fn closely; check it on next appt. He knows that he should have a close f/u as ACEi can acute worsen his RFT.  His updated medication list for this problem includes:    Coreg 12.5 Mg Tabs (Carvedilol) .[name]... Take 1 tablet by mouth two times a day    Lasix 40 Mg Tabs (Furosemide) .[name]... Take 1 tablet by mouth once a day    Norvasc 10 Mg Tabs (Amlodipine besylate) .[name]... Take 1 tablet by mouth once a day    Catapres 0.3 Mg Tabs (Clonidine hcl) .[name]... Take 1 pill by mouth two times a day.    Lisinopril 5 Mg Tabs (Lisinopril) .[name]... Take 1 pill by mouth daily.  BP today: 146/77 Prior BP: 168/83 (05/07/2009)  Labs Reviewed: K+: 4.4 (05/07/2009) Creat: : 1.69 (05/07/2009)   Chol: 283 (03/23/2009)   HDL: 29 (03/23/2009)   LDL: 224 (03/23/2009)   TG: 151 (03/23/2009)  Problem # 5:  DIABETES MELLITUS, TYPE II (ICD-250.00) Today again we had long discussion regarding how we can lower his A1C and better control his CBG. 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 He is ready to further check his diet and become more active. WIll follow.   His updated medication list for this problem includes:    Glipizide 10 Mg Tabs (Glipizide) .[name]... Take two tablets two times a day to lower blood sugars.    Actos 45 Mg Tabs (Pioglitazone hcl) .[name]... Take 1 tablet by mouth once a day    Lisinopril 5 Mg Tabs (Lisinopril) .[name]... Take 1 pill by mouth daily.  Labs Reviewed: Creat: 1.69 (05/07/2009)    Reviewed HgBA1c results: >14.0 (03/23/2009)  7.9 (04/21/2008)  Complete Medication List: 1)  Coreg 12.5 Mg Tabs (Carvedilol) .... Take 1 tablet by mouth two times a day 2)  Glipizide 10 Mg  Tabs (Glipizide) .... Take two tablets two times a day to lower blood sugars. 3)  Lasix 40 Mg Tabs (Furosemide) .... Take 1 tablet by mouth once a day 4)  Norvasc 10 Mg Tabs (Amlodipine besylate) .... Take 1 tablet by mouth once a day 5)  Actos 45 Mg Tabs (Pioglitazone hcl) .Marland KitchenMarland KitchenMarland Kitchen  Take 1 tablet by mouth once a day 6)  Viagra 100 Mg Tabs (Sildenafil citrate) .... Take one tablet by mouth one hour prior to intercourse 7)  Catapres 0.3 Mg Tabs (Clonidine hcl) .... Take 1 pill by mouth two times a day. 8)  Pravachol 40 Mg Tabs (Pravastatin sodium) .... Take one tablet daily to lower cholesterol. 9)  Nicoderm Cq 21 Mg/24hr Pt24 (Nicotine) .... Apply one patch on your skin daily. 10)  Freestyle Lite Test Strp (Glucose blood) .... Use to check blood sugars 1-2 times a day 11)  Lancets 30g Misc (Lancets) .... Use with blood sugar check 1-2 times a day 12)  Lisinopril 5 Mg Tabs (Lisinopril) .... Take 1 pill by mouth daily.  Patient Instructions: 1)  If you break out any rash or have sudden onset of shortness of breath, come to ED immediately.  2)  Please schedule a follow-up appointment in 2 weeks. 3)  Limit your Sodium (Salt) to less than 2 grams a day(slightly less than 1/2 a teaspoon) to prevent fluid retention, swelling, or worsening of symptoms. 4)  Tobacco is very bad for your health and your loved ones! You Should stop smoking!. 5)  Stop Smoking Tips: Choose a Quit date. Cut down before the Quit date. decide what you will do as a substitute when you feel the urge to smoke(gum,toothpick,exercise). 6)  It is important that you exercise regularly at least 20 minutes 5 times a week. If you develop chest pain, have severe difficulty breathing, or feel very tired , stop exercising immediately and seek medical attention. 7)  You need to lose weight. Consider a lower calorie diet and regular exercise.  8)  Check your blood sugars regularly. If your readings are usually above : or below 70 you should contact  our office. 9)  It is important that your Diabetic A1c level is checked every 3 months. 10)  See your eye doctor yearly to check for diabetic eye damage. 11)  Check your feet each night for sore areas, calluses or signs of infection. 12)  Check your Blood Pressure regularly. If it is above: you should make an appointment. Prescriptions: LISINOPRIL 5 MG TABS (LISINOPRIL) take 1 pill by mouth daily.  #30 x 0   Entered and Authorized by:   Jason Coop MD   Signed by:   Jason Coop MD on 05/29/2009   Method used:   Electronically to        [id number]* (retail)       [address]       [place], [place]  [address]       Ph: [phone]       Fax: [phone]   RxID:   [id number] ACTOS 45 MG  TABS (PIOGLITAZONE HCL) Take 1 tablet by mouth once a day  #30 x 0   Entered and Authorized by:   Jason Coop MD   Signed by:   Jason Coop MD on 05/29/2009   Method used:   Electronically to        [facility] [phone]* (retail)       [address]       [place], [place]  [address]       Ph: [phone]       Fax: [phone]   RxID:   [id number]    Prevention & Chronic Care Immunizations   Influenza vaccine: refused  (11/01/2006)    Tetanus booster: Not documented    Pneumococcal vaccine: Not  documented    H. zoster vaccine: Not documented  Colorectal Screening   Hemoccult: Not documented   Hemoccult action/deferral: Ordered  (03/23/2009)    Colonoscopy: Not documented   Colonoscopy action/deferral: GI referral  (03/23/2009)  Other Screening   PSA: Not documented   Smoking status: current  (05/29/2009)   Smoking cessation counseling: yes  (05/29/2009)  Diabetes Mellitus   HgbA1C: >14.0  (03/23/2009)    Eye exam: Not documented   Diabetic eye exam action/deferral: Ophthalmology referral  (03/23/2009)    Foot exam: yes  (04/21/2008)   High risk foot: No  (04/21/2008)   Foot care education: Not documented    Urine  microalbumin/creatinine ratio: 215.6  (03/23/2009)   Urine microalbumin action/deferral: Ordered    Diabetes flowsheet reviewed?: Yes   Progress toward A1C goal: Unchanged  Lipids   Total Cholesterol: 283  (03/23/2009)   Lipid panel action/deferral: Lipid Panel ordered   LDL: 224  (03/23/2009)   LDL Direct: Not documented   HDL: 29  (03/23/2009)   Triglycerides: 151  (03/23/2009)    SGOT (AST): 10  (03/23/2009)   BMP action: Ordered   SGPT (ALT): 9  (03/23/2009)   Alkaline phosphatase: 111  (03/23/2009)   Total bilirubin: 0.4  (03/23/2009)    Lipid flowsheet reviewed?: Yes   Progress toward LDL goal: Unchanged  Hypertension   Last Blood Pressure: 146 / 77  (05/29/2009)   Serum creatinine: 1.69  (05/07/2009)   BMP action: Ordered   Serum potassium 4.4  (05/07/2009)    Hypertension flowsheet reviewed?: Yes   Progress toward BP goal: Improved  Self-Management Support :   Personal Goals (by the next clinic visit) :     Personal A1C goal: 7  (05/29/2009)     Personal blood pressure goal: 130/80  (05/29/2009)     Personal LDL goal: 100  (05/29/2009)    Patient will work on the following items until the next clinic visit to reach self-care goals:     Medications and monitoring: take my medicines every day, check my blood sugar  (05/29/2009)     Eating: drink diet soda or water instead of juice or soda, eat foods that are low in salt, eat baked foods instead of fried foods  (05/29/2009)     Activity: join a walking program  (05/29/2009)    Diabetes self-management support: Pre-printed educational material, Resources for patients handout, Written self-care plan  (05/29/2009)   Diabetes care plan printed   Last diabetes self-management training by diabetes educator: 05/08/2009    Hypertension self-management support: Pre-printed educational material, Resources for patients handout, Written self-care plan  (05/29/2009)   Hypertension self-care plan printed.    Lipid  self-management support: Pre-printed educational material, Resources for patients handout, Written self-care plan  (05/29/2009)   Lipid self-care plan printed.      Resource handout printed.

## 2010-02-09 NOTE — Progress Notes (Signed)
Summary: DSMT coerage by [employer]-100%/dmr  Phone Note Outgoing Call   Call placed by: Jamison Neighbor RD,CDE,  May 07, 2009 4:27 PM Summary of Call: called [employer] for DSMT benefits: was told they are covered at 100% with no copay, no deductibel and no limit of visits. Informed patient of the same. REference # for this call is [id number]  also inquired about Pre authorization for JWJ:XBJY is subject to clinical reveiw. PA given for period of 6 months for 6 visits starting today through 11-06-09. reference # for that call [id number] [id number]

## 2010-02-11 NOTE — Assessment & Plan Note (Signed)
Summary: EST-2 WEEK RECHECK/CH   Vital Signs:  Patient profile:   65 year old male Height:      66 inches Weight:      168.1 pounds BMI:     27.23 Temp:     97.2 degrees F oral Pulse rate:   65 / minute BP sitting:   172 / 80  (right arm)  Vitals Entered By: Filomena Jungling NT II (February 02, 2010 1:26 PM) CC: NEED REFILLS,FOLLOW-UP VISIT FOR BLOOD PRESSURE Is Patient Diabetic? Yes Did you bring your meter with you today? No Pain Assessment Patient in pain? no      Nutritional Status BMI of 25 - 29 = overweight  Have you ever been in a relationship where you felt threatened, hurt or afraid?No   Does patient need assistance? Functional Status Self care Ambulation Normal   Primary Care Provider:  Jaci Lazier MD  CC:  NEED REFILLS and FOLLOW-UP VISIT FOR BLOOD PRESSURE.  History of Present Illness: Ross Bauer is a 65 y/o male with pmh outlined below here on followup of his blood pressure. His HTN has been very poorly controlled secondary to noncompliance. During his last visit it was clear that he did not know what meds he was supposed to be taking and for what purpose and we both clarified all his confusion during that visit. At that time, he was also having financial difficulty.  Today he states he has been more compliant and has been for the most part taking most of his meds as prescribed. He is also more financially stable. However, he states that he is trying to focus on lifestyle changes. He admits to consuming several hot dogs on a daily basis and is trying to cut back.  Otherwise, he has no other complaints today. No interval hospitalizations, no h/o fever, chills, chest pain, sob, palpitations, vision changes, headaches, changes in bowel habits, n/v, dysuria or other systemic symptoms.  Preventive Screening-Counseling & Management  Alcohol-Tobacco     Alcohol drinks/day: 0     Smoking Status: current     Smoking Cessation Counseling: yes     Packs/Day: 1     Year  Started: 16  Caffeine-Diet-Exercise     Does Patient Exercise: no  Current Problems (verified): 1)  Preventive Health Care  (ICD-V70.0) 2)  Leg Edema, Bilateral  (ICD-782.3) 3)  Anemia, Normocytic  (ICD-285.9) 4)  Headache  (ICD-784.0) 5)  Hyperlipidemia  (ICD-272.4) 6)  Back Pain  (ICD-724.5) 7)  Tobacco User  (ICD-305.1) 8)  Hernia  (ICD-553.9) 9)  Erectile Dysfunction  (ICD-302.72) 10)  Renal Insufficiency  (ICD-588.9) 11)  Hypertension  (ICD-401.9) 12)  Diabetes Mellitus, Type II  (ICD-250.00)  Current Medications (verified): 1)  Carvedilol 25 Mg Tabs (Carvedilol) .... Take 1 Tablet By Mouth Two Times A Day 2)  Glipizide 10 Mg Tabs (Glipizide) .... Take One Tab Two Times A Day To Lower Blood Sugars. 3)  Lasix 40 Mg Tabs (Furosemide) .... Take 1 Tablet By Mouth Once A Day 4)  Norvasc 10 Mg Tabs (Amlodipine Besylate) .... Take 1 Tablet By Mouth Once A Day 5)  Actos 45 Mg  Tabs (Pioglitazone Hcl) .... Take 1 Tablet By Mouth Once A Day 6)  Crestor 40 Mg Tabs (Rosuvastatin Calcium) .[name].. 1 Tablet By Mouth Daily 7)  Freestyle Lite Test  Strp (Glucose Blood) .... Use To Check Blood Sugars 1-2 Times A Day 8)  Lancets 30g  Misc (Lancets) .... Use With Blood Sugar Check 1-2 Times A  Day 9)  Lisinopril 20 Mg Tabs (Lisinopril) .[name].. 1 Tablet By Mouth Daily  Allergies (verified): 1)  ! Ace Inhibitors  Past History:  Past Medical History: Last updated: 11/16/2005 Diabetes mellitus, type II Hypertension Renal insufficiency Erectile dysfunction Hernia  Family History: Last updated: 03/23/2009 no early CAD  Social History: Last updated: 03/27/2008 Works for [employer].  Limited finances for which he frequently skips medications for a month or two.   Current Smoker Alcohol use-yes- occasional Drug use-no Plans to retire September 2010  Risk Factors: Alcohol Use: 0 (02/02/2010) Exercise: no (02/02/2010)  Risk Factors: Smoking Status: current (02/02/2010) Packs/Day: 1  (02/02/2010)  Review of Systems      See HPI  Physical Exam  General:  Vital signs reviewed and noted. Well-developed,well-nourished,in no acute distress; alert,appropriate and cooperative throughout examination. Head: normocephalic, atraumatic. Neck: No deformities, masses, or tenderness noted. Lungs: Normal respiratory effort. Clear to auscultation BL without crackles or wheezes.  Heart: RRR. S1 and S2 normal without gallop, murmur, or rubs.  Abdomen: BS normoactive. Soft, Nondistended, non-tender.  No masses or organomegaly. Extremities: No pretibial edema.    Impression & Recommendations:  Problem # 1:  HYPERTENSION (ICD-401.9) Assessment Improved Better today but still far from goal. I still believe that compliance remains an issue but today's reading is a step, albei a small one, in the right direction. His dietary habits are also a huge factor. For now, will increase his Lisinopril from 10mg  to 20mg  daily and encourage dietary modification. Will have him come back in the next 2-4 weeks for a blood pressure check and lab work, check creatinine, cut back on ACEi if >30% increase in Cr from baseline. If bp still poorly controlled, again, may consider adding clonidine which worked well for him in the past vs HCTZ. Ideally I would like to optimize his current regimen as well as lifestyle changes before adding another antihypertensive. WIll reassess at return visit.  His updated medication list for this problem includes:    Carvedilol 25 Mg Tabs (Carvedilol) .[name]... Take 1 tablet by mouth two times a day    Lasix 40 Mg Tabs (Furosemide) .[name]... Take 1 tablet by mouth once a day    Norvasc 10 Mg Tabs (Amlodipine besylate) .[name]... Take 1 tablet by mouth once a day    Lisinopril 20 Mg Tabs (Lisinopril) .[name]... 1 tablet by mouth daily  BP today: 172/80 Prior BP: 198/95 (12/16/2009)  Labs Reviewed: K+: 3.6 (07/31/2009) Creat: : 1.92 (07/31/2009)   Chol: 194 (07/31/2009)   HDL: 41 (07/31/2009)    LDL: 136 (07/31/2009)   TG: 85 (07/31/2009)  Problem # 2:  HYPERLIPIDEMIA (ICD-272.4) Will increase his Crestor from 20mg  to 40mg  given poor control of lipids.   His updated medication list for this problem includes:    Crestor 40 Mg Tabs (Rosuvastatin calcium) .[name]... 1 tablet by mouth daily  Labs Reviewed: SGOT: 16 (07/31/2009)   SGPT: 13 (07/31/2009)   HDL:41 (07/31/2009), 29 (03/23/2009)  LDL:136 (07/31/2009), 224 (03/23/2009)  Chol:194 (07/31/2009), 283 (03/23/2009)  Trig:85 (07/31/2009), 151 (03/23/2009)  Complete Medication List: 1)  Carvedilol 25 Mg Tabs (Carvedilol) .... Take 1 tablet by mouth two times a day 2)  Glipizide 10 Mg Tabs (Glipizide) .... Take one tab two times a day to lower blood sugars. 3)  Lasix 40 Mg Tabs (Furosemide) .... Take 1 tablet by mouth once a day 4)  Norvasc 10 Mg Tabs (Amlodipine besylate) .... Take 1 tablet by mouth once a day 5)  Actos 45  Mg Tabs (Pioglitazone hcl) .... Take 1 tablet by mouth once a day 6)  Crestor 40 Mg Tabs (Rosuvastatin calcium) .[name].. 1 tablet by mouth daily 7)  Freestyle Lite Test Strp (Glucose blood) .... Use to check blood sugars 1-2 times a day 8)  Lancets 30g Misc (Lancets) .... Use with blood sugar check 1-2 times a day 9)  Lisinopril 20 Mg Tabs (Lisinopril) .[name].. 1 tablet by mouth daily  Patient Instructions: 1)  Your Lisinopril will be increased to 20mg  daily for better control of your blood pressure. 2)  Your Crestor (cholesterol medicine) will also be increased to 40mg  daily.  3)  Good job on trying to cut down on fatty and fried foods. Pls continue to do so as this will help control your blood pressure tremendously. Also try to make sure you are eating foods that are low in sodium (salt). 4)  Pls make an appointment with Dr. Narda Bonds, for the next 2-4 weeks (available date in mid February). We will recheck your blood pressure and get labs. Prescriptions: NORVASC 10 MG TABS (AMLODIPINE BESYLATE) Take 1 tablet by mouth once a  day  #30 x 2   Entered and Authorized by:   Jaci Lazier MD   Signed by:   Jaci Lazier MD on 02/02/2010   Method used:   Faxed to ...       [facility] [address] [phone]* (retail)       [address]       [place], [place]  [address]       Ph: [phone]       Fax: [phone]   RxID:   [id number] CRESTOR 40 MG TABS (ROSUVASTATIN CALCIUM) 1 tablet by mouth daily  #30 x 6   Entered and Authorized by:   Jaci Lazier MD   Signed by:   Jaci Lazier MD on 02/02/2010   Method used:   Print then Give to Patient   RxID:   [id number] LISINOPRIL 20 MG TABS (LISINOPRIL) 1 tablet by mouth daily  #30 x 3   Entered and Authorized by:   Jaci Lazier MD   Signed by:   Jaci Lazier MD on 02/02/2010   Method used:   Print then Give to Patient   RxID:   [id number] LISINOPRIL 10 MG TABS (LISINOPRIL) Take 2 tablets by mouth once a day  #60 x 3   Entered and Authorized by:   Jaci Lazier MD   Signed by:   Jaci Lazier MD on 02/02/2010   Method used:   Print then Give to Patient   RxID:   [id number]      Prevention & Chronic Care Immunizations   Influenza vaccine: refused  (11/01/2006)   Influenza vaccine deferral: Refused  (12/16/2009)    Tetanus booster: Not documented   Td booster deferral: Refused  (12/16/2009)    Pneumococcal vaccine: Not documented   Pneumococcal vaccine deferral: Refused  (12/16/2009)    H. zoster vaccine: Not documented   H. zoster vaccine deferral: Deferred  (07/17/2009)  Colorectal Screening   Hemoccult: Not documented   Hemoccult action/deferral: Ordered  (12/16/2009)    Colonoscopy: Not documented   Colonoscopy action/deferral: Refused  (07/31/2009)  Other Screening   PSA: Not documented   PSA action/deferral: Discussed-PSA declined  (07/31/2009)   Smoking status: current  (02/02/2010)   Smoking cessation counseling: yes  (02/02/2010)  Diabetes Mellitus   HgbA1C: 7.2  (12/16/2009)    Eye exam: Not documented   Diabetic eye  exam action/deferral:  Ophthalmology referral  (12/16/2009)    Foot exam: yes  (07/31/2009)   Foot exam action/deferral: Do today   High risk foot: No  (04/21/2008)   Foot care education: Done  (07/31/2009)    Urine microalbumin/creatinine ratio: 215.6  (03/23/2009)   Urine microalbumin action/deferral: Ordered    Diabetes flowsheet reviewed?: Yes   Progress toward A1C goal: Improved  Lipids   Total Cholesterol: 194  (07/31/2009)   Lipid panel action/deferral: Lipid Panel ordered   LDL: 136  (07/31/2009)   LDL Direct: Not documented   HDL: 41  (07/31/2009)   Triglycerides: 85  (07/31/2009)    SGOT (AST): 16  (07/31/2009)   BMP action: Ordered   SGPT (ALT): 13  (07/31/2009)   Alkaline phosphatase: 84  (07/31/2009)   Total bilirubin: 0.4  (07/31/2009)    Lipid flowsheet reviewed?: Yes   Progress toward LDL goal: Improved  Hypertension   Last Blood Pressure: 172 / 80  (02/02/2010)   Serum creatinine: 1.92  (07/31/2009)   BMP action: Ordered   Serum potassium 3.6  (07/31/2009)    Hypertension flowsheet reviewed?: Yes   Progress toward BP goal: Improved  Self-Management Support :   Personal Goals (by the next clinic visit) :     Personal A1C goal: 7  (05/29/2009)     Personal blood pressure goal: 130/80  (05/29/2009)     Personal LDL goal: 100  (05/29/2009)    Diabetes self-management support: Education handout, Resources for patients handout  (12/16/2009)   Last diabetes self-management training by diabetes educator: 05/08/2009    Hypertension self-management support: Education handout, Resources for patients handout  (12/16/2009)    Lipid self-management support: Education handout, Resources for patients handout  (12/16/2009)     Appended Document: EST-2 WEEK RECHECK/CH    Clinical Lists Changes  Orders: Added new Service order of Est. Patient Level II (04540) - Signed

## 2010-03-02 ENCOUNTER — Ambulatory Visit (INDEPENDENT_AMBULATORY_CARE_PROVIDER_SITE_OTHER): Payer: Medicare HMO | Admitting: Internal Medicine

## 2010-03-02 ENCOUNTER — Encounter: Payer: Self-pay | Admitting: Internal Medicine

## 2010-03-02 VITALS — BP 124/71 | HR 73 | Temp 97.1°F | Ht 66.0 in | Wt 159.0 lb

## 2010-03-02 DIAGNOSIS — Z0000 Encounter for general adult medical examination without abnormal findings: Secondary | ICD-10-CM

## 2010-03-02 DIAGNOSIS — E119 Type 2 diabetes mellitus without complications: Secondary | ICD-10-CM

## 2010-03-02 DIAGNOSIS — I10 Essential (primary) hypertension: Secondary | ICD-10-CM

## 2010-03-02 DIAGNOSIS — N259 Disorder resulting from impaired renal tubular function, unspecified: Secondary | ICD-10-CM

## 2010-03-02 DIAGNOSIS — E785 Hyperlipidemia, unspecified: Secondary | ICD-10-CM

## 2010-03-02 LAB — BASIC METABOLIC PANEL
BUN: 45 mg/dL — ABNORMAL HIGH (ref 6–23)
Calcium: 9.5 mg/dL (ref 8.4–10.5)
Glucose, Bld: 351 mg/dL — ABNORMAL HIGH (ref 70–99)
Potassium: 4.6 mEq/L (ref 3.5–5.3)
Sodium: 134 mEq/L — ABNORMAL LOW (ref 135–145)

## 2010-03-02 MED ORDER — LISINOPRIL 20 MG PO TABS: 10.0000 mg | ORAL_TABLET | Freq: Every day | ORAL | Status: DC

## 2010-03-02 MED ORDER — LISINOPRIL 10 MG PO TABS: 10.0000 mg | ORAL_TABLET | Freq: Every day | ORAL | Status: DC

## 2010-03-02 NOTE — Patient Instructions (Signed)
Great job on taking your medications exactly as prescribed. Your blood pressure looks excellent today.  Please let me know if you have any questions or concerns at all about your blood pressure and diabetes medications. Please do not hesitate to call the clinic if he needs prescriptions for any of his medicines. Continue to maintain a good dietary habit as well as a good exercise regimen. You are doing a great job taking care of yourself. Call the clinic he have any questions or concerns.

## 2010-03-02 NOTE — Progress Notes (Signed)
  Subjective:    Patient ID: Ross Bauer, male    DOB: 1945-03-06, 65 y.o.   MRN: [id number]  HPI Patient is a 65 year old man with past medical history of hypertension, hyperlipidemia, diabetes type 2 clot, and renal insufficiency here on follow up of his blood pressure.  Patient's blood pressure has been poorly controlled over the past several months secondary to initially illiteracy, poor compliance as well as financial difficulty.   He has been doing relatively well since his last visit and states he has been taking all his medications as prescribed. Otherwise, he has no other complaints today. No interval hospitalizations, no h/o fever, chills, chest pain, sob, palpitations, vision changes, headaches, changes in bowel habits, n/v, dysuria, peripheral edema or other systemic symptoms.    Review of Systems As per HPI above    Objective:   Physical Exam  Constitutional: He is oriented to person, place, and time. He appears well-developed and well-nourished. No distress.  Cardiovascular: Normal rate, regular rhythm and normal heart sounds.  Exam reveals no gallop and no friction rub.   No murmur heard. Pulmonary/Chest: Effort normal and breath sounds normal. No respiratory distress. He has no wheezes. He has no rales.  Abdominal: Soft. Bowel sounds are normal. There is no tenderness.  Musculoskeletal: Normal range of motion.  Neurological: He is alert and oriented to person, place, and time.  Psychiatric: He has a normal mood and affect.         Assessment & Plan:

## 2010-03-02 NOTE — Assessment & Plan Note (Signed)
Continue Crestor at 40 mg daily.

## 2010-03-02 NOTE — Assessment & Plan Note (Signed)
Patient declines screening colonoscopy.  Will think about Hemoccult cards.  These will be mailed to the patient.

## 2010-03-02 NOTE — Assessment & Plan Note (Signed)
Blood pressure fantastic today at 128/71 (previosly systolics have ranged in the 170s to 180s consistently). Although the patient's lisinopril was increased to 20 mg daily during the last office visit, the patient reports to me today that he was only taking 10 mg daily.  - Check b met today -  Keep lisinopril at 10 mg daily. -  Continue Lasix, Coreg, and Norvasc at current doses.

## 2010-03-02 NOTE — Assessment & Plan Note (Signed)
Last hemoglobin A1c in December was 7.2. The patient is on a regimen of Actos and glipizide. Patient states to me that he only occasionally checks his blood sugars have ranged from the 190s to the low 200s. He did not bring his meter with him. Again with his A1c at 7.2, I will continue this medication regimen especially given that the patient sometimes misses his medications sometimes a whole month at a time, so will keep him on this regimen and continue to follow.  -  Continue Actos and Glipizide. -  Diabetic foot exam completed July 2011 and within normal limits -  Recent diabetic eye exam according to patient but no records in the chart.  Conduct a thorough chart review and if still not found, the patient will need referral during his next visit

## 2010-03-03 ENCOUNTER — Other Ambulatory Visit: Payer: Self-pay | Admitting: Internal Medicine

## 2010-03-03 ENCOUNTER — Telehealth: Payer: Self-pay | Admitting: Internal Medicine

## 2010-03-03 MED ORDER — ISOSORB DINITRATE-HYDRALAZINE 20-37.5 MG PO TABS: 1.0000 | ORAL_TABLET | Freq: Three times a day (TID) | ORAL | Status: DC

## 2010-03-03 NOTE — Assessment & Plan Note (Signed)
Patient's Cr up to 2.36, baseline Cr b/w 1.5-1.9 per chart review. Will d/c Lisinopril, make referral to nephrology to establish care and for long term management. Anticipate that if patient's blood pressures continues to be under poor control, he may be a dialysis candidate eventually. Will discuss with patient the importance of taking his antihypertensives regularly so we can work together to help slow down the progression of his disease.

## 2010-03-03 NOTE — Progress Notes (Signed)
Addended byJaci Lazier on: 03/03/2010 10:33 AM   Modules accepted: Orders

## 2010-03-03 NOTE — Telephone Encounter (Signed)
Patient called and informed of worsening renal failure.  - Instructed to discontinue Lisinopril.  - Informed that I will be making a referral to [facility] to establish care and for long term followup. - Informed that I will be adding Bidil to his antihypertensive regimen. - Informed that blood pressure control is key in slowing down progression of his renal failure.

## 2010-03-29 LAB — GLUCOSE, CAPILLARY: Glucose-Capillary: 192 mg/dL — ABNORMAL HIGH (ref 70–99)

## 2010-03-30 LAB — GLUCOSE, CAPILLARY: Glucose-Capillary: 204 mg/dL — ABNORMAL HIGH (ref 70–99)

## 2010-04-05 LAB — GLUCOSE, CAPILLARY: Glucose-Capillary: 290 mg/dL — ABNORMAL HIGH (ref 70–99)

## 2010-04-20 LAB — GLUCOSE, CAPILLARY: Glucose-Capillary: 304 mg/dL — ABNORMAL HIGH (ref 70–99)

## 2010-04-21 LAB — GLUCOSE, CAPILLARY
Glucose-Capillary: 181 mg/dL — ABNORMAL HIGH (ref 70–99)
Glucose-Capillary: 138 mg/dL — ABNORMAL HIGH (ref 70–99)

## 2010-04-22 LAB — GLUCOSE, CAPILLARY: Glucose-Capillary: 335 mg/dL — ABNORMAL HIGH (ref 70–99)

## 2010-04-27 LAB — GLUCOSE, CAPILLARY: Glucose-Capillary: 177 mg/dL — ABNORMAL HIGH (ref 70–99)

## 2010-05-20 ENCOUNTER — Other Ambulatory Visit: Payer: Self-pay | Admitting: *Deleted

## 2010-05-20 MED ORDER — AMLODIPINE BESYLATE 10 MG PO TABS: 10.0000 mg | ORAL_TABLET | Freq: Every day | ORAL | Status: DC

## 2010-07-20 ENCOUNTER — Other Ambulatory Visit: Payer: Self-pay | Admitting: Internal Medicine

## 2010-07-21 NOTE — Telephone Encounter (Signed)
In last clinic note dated 02/2010, it clearly states that patient is to discontinue lisinopril secondary to worsening renal failure, was added bidil and was recommended follow-up with [facility]. I am therefore refusing this prescription. If he has followed up with [facility] and they wanted it restarted, then I need the clinic note to verify, and will subsequently resubmit.  Please call the patient and clarify understanding of instructions during last clinic visit, clarify if he followed up with [facility], if he did follow-up please go ahead and request clinic records, and lastly get back at me if lisinopril is actually supposed to be on his list, as per [facility] recommendations.  Thank you.  [name], D.O.

## 2010-07-28 ENCOUNTER — Other Ambulatory Visit: Payer: Self-pay | Admitting: Internal Medicine

## 2010-07-29 ENCOUNTER — Other Ambulatory Visit: Payer: Self-pay | Admitting: *Deleted

## 2010-07-29 MED ORDER — PIOGLITAZONE HCL 45 MG PO TABS: 45.0000 mg | ORAL_TABLET | Freq: Every day | ORAL | Status: DC

## 2010-07-29 NOTE — Telephone Encounter (Signed)
Pt calls and states he is out of town until 8/1, could we do 1 month supply and give pt an appt

## 2010-07-29 NOTE — Telephone Encounter (Signed)
Call to pharmacy.  Pharmacist said that pt is continuing to request and it is automatically sent to the doctor's office.  Said that they have the note from Dr. Thad Ranger that pt is no longer on the medication.  Call to pharmacy pt said that he knows that he is no longer on the Lisinopril and requested it in error.

## 2010-07-29 NOTE — Telephone Encounter (Signed)
Pt is not supposed to be on Lisinopril because of renal failure. Therefore, this refill is rejected.

## 2010-10-18 ENCOUNTER — Other Ambulatory Visit: Payer: Self-pay | Admitting: Internal Medicine

## 2010-11-29 ENCOUNTER — Other Ambulatory Visit: Payer: Self-pay | Admitting: Internal Medicine

## 2010-11-29 DIAGNOSIS — E1165 Type 2 diabetes mellitus with hyperglycemia: Secondary | ICD-10-CM

## 2010-11-29 NOTE — Telephone Encounter (Signed)
I am only refilling a 1 month supply of requested medication today. The patient was last seen in the clinic in 02/2010 and was recommended 3 month follow-up that was not completed, and therefore needs to return to clinic prior to additional refills being provided. Please schedule him for an appt.  Thank you!  [name], D.O.

## 2010-12-23 ENCOUNTER — Encounter: Payer: Self-pay | Admitting: Internal Medicine

## 2010-12-23 ENCOUNTER — Ambulatory Visit (INDEPENDENT_AMBULATORY_CARE_PROVIDER_SITE_OTHER): Payer: Medicare HMO | Admitting: Internal Medicine

## 2010-12-23 DIAGNOSIS — E119 Type 2 diabetes mellitus without complications: Secondary | ICD-10-CM

## 2010-12-23 DIAGNOSIS — Z0000 Encounter for general adult medical examination without abnormal findings: Secondary | ICD-10-CM

## 2010-12-23 DIAGNOSIS — F17200 Nicotine dependence, unspecified, uncomplicated: Secondary | ICD-10-CM

## 2010-12-23 DIAGNOSIS — H01003 Unspecified blepharitis right eye, unspecified eyelid: Secondary | ICD-10-CM

## 2010-12-23 DIAGNOSIS — H0520 Unspecified exophthalmos: Secondary | ICD-10-CM

## 2010-12-23 DIAGNOSIS — H01009 Unspecified blepharitis unspecified eye, unspecified eyelid: Secondary | ICD-10-CM

## 2010-12-23 DIAGNOSIS — I10 Essential (primary) hypertension: Secondary | ICD-10-CM

## 2010-12-23 DIAGNOSIS — E785 Hyperlipidemia, unspecified: Secondary | ICD-10-CM

## 2010-12-23 LAB — GLUCOSE, CAPILLARY: Glucose-Capillary: 94 mg/dL (ref 70–99)

## 2010-12-23 MED ORDER — GLIPIZIDE 10 MG PO TABS: 10.0000 mg | ORAL_TABLET | Freq: Every day | ORAL | Status: DC

## 2010-12-23 MED ORDER — FUROSEMIDE 40 MG PO TABS: 40.0000 mg | ORAL_TABLET | Freq: Two times a day (BID) | ORAL | Status: DC

## 2010-12-23 NOTE — Progress Notes (Addendum)
Subjective:   Patient ID: Ross Bauer male   DOB: 1945/07/29 65 y.o.   MRN: [id number]  HPI: [name] is a 65 y.o. with a PMHx of diabetes mellitus, type II, HTN, HLD, who presented to clinic today for the following:  1. DMII, noninsulin dependent - A1c of 5.4 (12/23/2010) - Patient checking blood sugars 1 times daily, intermittently throughout the day. Reports postprandial blood sugars of 170-180 mg/dL. Currently taking glipizide and actos. 0 hypoglycemic episodes since last visit. denies polyuria, polydipsia, nausea, vomiting, diarrhea.  does request refills today.  2. HTN - Patient does not check blood pressure regularly at home. Currently taking amlodipine (Norvase), furosemide (Lasix) and carvedilol - states he never missed doses of these medications. denies headaches, dizziness, lightheadedness, chest pain, shortness of breath.  does request refills today.  3. HLD - currently taking Crestor 40mg  - denies missing doses.  denies chest pain, difficulty breathing, palpitations, tachycardia, and muscle pains. does request refills today.  4. Tobacco abuse - continuing to smoke 1 ppd / 50 years. States not ready to quit yet, understands the long-term risks of smoking including, but not limited to, increased risk for lung cancer, strokes, heart attacks.  5. Preventative care - patient is due for most of his health maintenance exams and is hesitant to get most of these tests/ interventions.     Review of Systems: Per HPI.   Current Outpatient Medications: Medication Sig  . amLODipine (NORVASC) 10 MG tablet Take 1 tablet (10 mg total) by mouth daily.  [name] aspirin 81 MG tablet Take 81 mg by mouth daily.    . carvedilol (COREG) 25 MG tablet Take 25 mg by mouth 2 (two) times daily with meals.    . furosemide (LASIX) 40 MG tablet Take 40 mg by mouth daily.    [name] glipiZIDE (GLUCOTROL) 10 MG tablet TAKE ONE TABLET BY MOUTH TWICE DAILY TO LOWER BLOOD SUGAR  . glucose blood (FREESTYLE  LITE) test strip Use to check blood sugar 1-2 times a day.    . Lancets 30G MISC Use to check blood sugar 1-2 times daily.   . rosuvastatin (CRESTOR) 40 MG tablet Take 40 mg by mouth daily.    . ACTOS 45 MG tablet TAKE ONE TABLET BY MOUTH EVERY DAY    Allergies: Allergen Reactions  . Ace Inhibitors     REACTION: intolerance    Past Medical History:   Diagnosis Date  . 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 Hypertension   . CKD (chronic kidney disease)     BL Cr 1.6 - 2.3  . ED (erectile dysfunction)   . Hernia   . Bilateral leg edema   . Hyperlipidemia   . Normocytic anemia     Hg baseline 11-12.    Past Surgical History: None.  Objective:   Physical Exam: Filed Vitals:   12/23/10 1406  BP: 149/77  Pulse: 83  Temp: 97.1 F (36.2 C)     General: Vital signs reviewed and noted. Well-developed, well-nourished, in no acute distress; alert, appropriate and cooperative throughout examination.  Head: Normocephalic, atraumatic.  Eyes: conjunctivae/corneas clear. PERRL, EOM's intact. Fundi benign. lower eyelid with inflammation at base of eyelashes.  Ears: TM nonerythematous, not bulging, good light reflex bilaterally.  Nose: Mucous membranes moist, inflammed, but nonerythematous.  Throat: Oropharynx nonerythematous, no exudate appreciated.   Neck: No deformities, masses, or tenderness noted.  Lungs:  Normal respiratory effort. Clear to auscultation BL without crackles or  wheezes.  Heart: RRR. S1 and S2 normal without gallop, or rubs. (+) murmur  Abdomen:  BS normoactive. Soft, Nondistended, non-tender.  No masses or organomegaly.  Extremities: Trace to 1+ pretibial edema. Thickened toenails bilaterally.   Neurologic: A&O X3, CN II - XII are grossly intact. Motor strength is 5/5 in the all 4 extremities, Sensations intact to light touch.    Assessment & Plan:  Case and plan of care discussed with Dr. Anderson Malta.    Addendum to above: I  finally received records from his pharmacy, which show that he has not been on a statin, at least since 06/2010, although he states he is taking it, does not use multiple pharmacies, and has remained on his medication list. Therefore, will not change his HLD meds, will just encourage compliance once he comes for his follow-up visit.  [name], D.O.  01/26/2011, 1:13 AM

## 2010-12-23 NOTE — Patient Instructions (Signed)
   Please follow-up at the clinic in 1 month, at which time we will reevaluate your diabetes, blood pressure, kidneys.  Please follow-up in the clinic sooner if needed.  Please go to the eye doctor.  Please STOP your actos, and decrease your glipizide to once a day.  Please increase your lasix to twice daily.   If you have been started on new medication(s), and you develop symptoms concerning for allergic reaction, including, but not limited to, throat closing, tongue swelling, rash, please stop the medication immediately and call the clinic at [phone], and go to the ER.  If you are diabetic, please bring your meter to your next visit.  If symptoms worsen, or new symptoms arise, please call the clinic or go to the ER.  Please bring all of your medications in a bag to your next visit.

## 2010-12-24 LAB — COMPREHENSIVE METABOLIC PANEL
ALT: 14 U/L (ref 0–53)
Alkaline Phosphatase: 71 U/L (ref 39–117)
CO2: 25 mEq/L (ref 19–32)
Creat: 2.45 mg/dL — ABNORMAL HIGH (ref 0.50–1.35)
Glucose, Bld: 88 mg/dL (ref 70–99)
Total Bilirubin: 0.4 mg/dL (ref 0.3–1.2)

## 2010-12-24 LAB — LIPID PANEL
Cholesterol: 236 mg/dL — ABNORMAL HIGH (ref 0–200)
LDL Cholesterol: 188 mg/dL — ABNORMAL HIGH (ref 0–99)
Total CHOL/HDL Ratio: 7.9 Ratio
Triglycerides: 92 mg/dL (ref <150)
VLDL: 18 mg/dL (ref 0–40)

## 2011-01-01 ENCOUNTER — Encounter: Payer: Self-pay | Admitting: Internal Medicine

## 2011-01-01 DIAGNOSIS — H01003 Unspecified blepharitis right eye, unspecified eyelid: Secondary | ICD-10-CM | POA: Insufficient documentation

## 2011-01-01 NOTE — Assessment & Plan Note (Signed)
Liver Function Tests:    Component Value Date/Time   AST 18 12/23/2010 1523   ALT 14 12/23/2010 1523   ALKPHOS 71 12/23/2010 1523   BILITOT 0.4 12/23/2010 1523   PROT 6.7 12/23/2010 1523   ALBUMIN 3.4* 12/23/2010 1523    Lipid Panel:     Component Value Date/Time   CHOL 236* 12/23/2010 1523   TRIG 92 12/23/2010 1523   HDL 30* 12/23/2010 1523   CHOLHDL 7.9 12/23/2010 1523   VLDL 18 12/23/2010 1523   LDLCALC 188* 12/23/2010 1523    Assessment: Lipid Control: not controlled  Progress toward goals: deteriorated  Barriers to meeting goals: unclear at this point, as results were obtained after pt left the clinic, and he previously states he is taking his medications regularly and is already on a potent statin.   Plan:  continue current medications  Will call to discuss with patient regarding any barriers to good lipid control.  Will also obtain pharmacy records to review for medication compliance.

## 2011-01-01 NOTE — Assessment & Plan Note (Signed)
Pt's symptoms and exam most consistent with blepharitis.  Refer to ophtho  Given his exophthalmos, will check TSH.  Recommend cleaning with baby shampoo, keeping clean and dry, warm compress

## 2011-01-01 NOTE — Assessment & Plan Note (Signed)
Health Maintenance  Topic Date Due  . Pneumococcal Polysaccharide Vaccine (#1) 09/10/1945  . Ophthalmology Exam  09/10/1953  . Tetanus/tdap  09/11/1962  . Colonoscopy  09/10/1993  . Zostavax  09/11/2003  . Foot Exam  08/01/2010  . Influenza Vaccine  10/11/2010  . Hemoglobin A1c  03/23/2011  . Lipid Panel  12/23/2011   Overdue for almost all of his preventative care including:   Flu shot, PCV, lipid panel, colonoscopy, eye exam, tetanus shot, foot exam  Plan:  Refuses flu shot, PCV, tetanus - states he will consider for next visit.  Foot exam completed today.  Referral to optho made today for his blepharitis and annual diabetic exam.  Refuses colonoscopy.  Influenza immunization was not given due to patient refusal.

## 2011-01-01 NOTE — Assessment & Plan Note (Signed)
BP Readings from Last 3 Encounters:  12/23/10 149/77  03/02/10 124/71  02/02/10 172/80    Basic Metabolic Panel:    Component Value Date/Time   NA 139 12/23/2010 1523   K 3.6 12/23/2010 1523   CL 104 12/23/2010 1523   CO2 25 12/23/2010 1523   BUN 28* 12/23/2010 1523   CREATININE 2.45* 12/23/2010 1523   CREATININE 1.92* 07/31/2009 2042   GLUCOSE 88 12/23/2010 1523   CALCIUM 9.1 12/23/2010 1523    Assessment: Hypertension Control: not controlled  Progress toward goals: deteriorated  Barriers to meeting goals: no barriers identified   Plan:  Continue amlodipine and coreg at current dosage  Increase Lasix to 40mg  BID so that can help with tx of his blood pressure, decrease lower extremity edema.

## 2011-01-01 NOTE — Assessment & Plan Note (Addendum)
Labs: Lab Results  Component Value Date   HGBA1C 5.4 12/23/2010   HGBA1C 7.2 12/16/2009   CREATININE 2.45* 12/23/2010   CREATININE 1.92* 07/31/2009   MICROALBUR 14.06* 03/23/2009   MICRALBCREAT 215.6* 03/23/2009   CHOL 236* 12/23/2010   HDL 30* 12/23/2010   TRIG 92 12/23/2010    Last eye exam and foot exam:    Component Value Date/Time   HMDIABFOOTEX done 07/31/2009     Assessment:  Disease Control: controlled  Progress toward goals: at goal  Barriers to meeting goals: no barriers identified   Plan: Glucometer log was not reviewed today, as pt did not have glucometer available for review.      Patient's blood sugar is too tightly controlled. Especially given his lower extremity edema, will adjust hsi medications.  Reminded to get eye exam - ophtho referral made for blepharitis and DM exam.  DC actos, decrease glipidize to 10mg  once daily.  Foot exam performed today, pt does not want referral to podiatrist today for thickened toenails.

## 2011-01-01 NOTE — Assessment & Plan Note (Signed)
   The patient was counseled on the dangers of tobacco use, and was advised to quit and reluctant to quit.    Reviewed strategies to maximize success, including removing cigarettes and smoking materials from environment, stress management, substitution of other forms of reinforcement and support of family/friends.

## 2011-01-05 ENCOUNTER — Other Ambulatory Visit: Payer: Self-pay | Admitting: Internal Medicine

## 2011-01-05 MED ORDER — ROSUVASTATIN CALCIUM 40 MG PO TABS: 40.0000 mg | ORAL_TABLET | Freq: Every day | ORAL | Status: DC

## 2011-01-05 NOTE — Progress Notes (Signed)
I called the patient's pharmacy, [facility] on [address], who indicate that pt only had a 6 month supply of his crestor, prescribed on 01/2010. Therefore, this persistent elevation of his LDL likely represents medication noncompliance. I will therefore, not make any changes to his regimen, aside from refilling his Crestor, and as the nurses to inform the patient that another 6 month supply has been sent for him, and that it is very important for him to come to the doctor regularly and to take his medications regularly to avoid potential complications of poorly controlled cholesterol.  [name], D.O.

## 2011-01-06 NOTE — Progress Notes (Signed)
Spoke w/ pt, reminded of appt and need to take all meds as directed, he verb understanding

## 2011-01-06 NOTE — Progress Notes (Signed)
Talked with pt and informed him of Dr Saralyn Pilar message.  He voices understanding and will keep appointment.

## 2011-01-28 ENCOUNTER — Encounter: Payer: Medicare HMO | Admitting: Internal Medicine

## 2011-02-03 ENCOUNTER — Encounter: Payer: Medicare HMO | Admitting: Internal Medicine

## 2011-02-11 ENCOUNTER — Encounter: Payer: Self-pay | Admitting: Internal Medicine

## 2011-02-11 ENCOUNTER — Ambulatory Visit (INDEPENDENT_AMBULATORY_CARE_PROVIDER_SITE_OTHER): Payer: Medicare HMO | Admitting: Internal Medicine

## 2011-02-11 ENCOUNTER — Encounter: Payer: Medicare HMO | Admitting: Internal Medicine

## 2011-02-11 VITALS — BP 137/75 | HR 72 | Temp 97.3°F | Ht 66.0 in | Wt 157.5 lb

## 2011-02-11 DIAGNOSIS — H01009 Unspecified blepharitis unspecified eye, unspecified eyelid: Secondary | ICD-10-CM

## 2011-02-11 DIAGNOSIS — E785 Hyperlipidemia, unspecified: Secondary | ICD-10-CM

## 2011-02-11 DIAGNOSIS — H01003 Unspecified blepharitis right eye, unspecified eyelid: Secondary | ICD-10-CM

## 2011-02-11 DIAGNOSIS — E119 Type 2 diabetes mellitus without complications: Secondary | ICD-10-CM

## 2011-02-11 DIAGNOSIS — F17200 Nicotine dependence, unspecified, uncomplicated: Secondary | ICD-10-CM

## 2011-02-11 DIAGNOSIS — I10 Essential (primary) hypertension: Secondary | ICD-10-CM

## 2011-02-11 DIAGNOSIS — Z0000 Encounter for general adult medical examination without abnormal findings: Secondary | ICD-10-CM

## 2011-02-11 NOTE — Patient Instructions (Addendum)
   Please follow-up at the clinic in 3 months, at which time we will reevaluate your blood pressure, diabetes, cholesterol.  There have not been changes in your medications, please look at your complete medication list for details.   Please follow-up in the clinic sooner if needed.  If you have been started on new medication(s), and you develop symptoms concerning for allergic reaction, including, but not limited to, throat closing, tongue swelling, rash, please stop the medication immediately and call the clinic at [phone], and go to the ER.  If you are diabetic, please bring your meter to your next visit.  If symptoms worsen, or new symptoms arise, please call the clinic or go to the ER.  Start thinking about getting your vaccinations and when you will feel ready to get your colonoscopy and how you can cut back on your smoking.  Please bring all of your medications in a bag to your next visit.

## 2011-02-11 NOTE — Progress Notes (Signed)
Subjective:   Patient ID: Ross Bauer male   DOB: April 27, 1945 66 y.o.   MRN: [id number]  HPI: [name] is a 66 y.o. with a PMHx of diabetes mellitus, type II, HTN, HLD, who presented to clinic today for the following:  1) DMII, noninsulin dependent - A1c of 5.4 (12/23/2010) - Patient checking blood sugars 1 time weekly, usually before breakfast. Reports postprandial blood sugars of 130s  mg/dL. Currently taking glipizide. 0 hypoglycemic episodes since last visit. denies polyuria, polydipsia, nausea, vomiting, diarrhea.  does not request refills today.  2) HTN - Patient does not check blood pressure regularly at home. Currently taking amlodipine (Norvase), furosemide (Lasix) and carvedilol - states taking regularly without missing doses. Was increased of his Lasix in 12/2010, however could not tolerate the increased urination, so he himself decreased it back to once daily. Denies headaches, dizziness, lightheadedness, chest pain, shortness of breath.  does request refills today of amlodipine.  3) HLD - currently taking Crestor 40mg  - has been out of it for 1 week, has been unable to afford it. When I had last checked his lipid panel, LDL was elevated above goal (188 mg), however I subsequently called his pharmacy and found that the patient had not been filling his Crestor since June of 2012. Therefore, this elevation of cholesterol reflected untreated hyperlipidemia as opposed to inadequately treated. The patient is resistant to changing his statin to a more affordableone.denies chest pain, difficulty breathing, palpitations, tachycardia, and muscle pains. does request refills today.  4) Tobacco abuse - continuing to smoke 1 ppd / 50 years. States not ready to quit yet, understands the long-term risks of smoking including, but not limited to, increased risk for lung cancer, strokes, heart attacks. Still not wanting to decrease intake.  5) Preventative care - he still remains to refuse most  of his health maintenance exam, and continues to refuse all vaccinations and screening test. He will be going to the ophthalmologist next week for evaluation of his blepharitis and diabetic eye exam.  6) Blepharitis - patient was diagnosed at his last visit with me, and recommended to use warm compress, clean eyes daily, etc. However, he was not followed recommendations, and continues to have swelling of eye lids. States he will just wait until follow-up with the ophtho to get treated. Denies vision changes, fevers, chills.  Review of Systems: Per HPI.  Current Outpatient Prescriptions Medication Sig  . amLODipine (NORVASC) 10 MG tablet Take 1 tablet (10 mg total) by mouth daily.  [name] aspirin 81 MG tablet Take 81 mg by mouth daily.    . carvedilol (COREG) 25 MG tablet Take 25 mg by mouth 2 (two) times daily with meals.    . furosemide (LASIX) 40 MG tablet Take 40 mg by mouth daily.  [name] glipiZIDE (GLUCOTROL) 10 MG tablet Take 1 tablet (10 mg total) by mouth daily with breakfast.  . glucose blood (FREESTYLE LITE) test strip Use to check blood sugar 1-2 times a day.  . Lancets 30G MISC Use to check blood sugar 1-2 times daily.   . rosuvastatin (CRESTOR) 40 MG tablet Take 1 tablet (40 mg total) by mouth daily.    Allergies  Allergen Reactions  . Ace Inhibitors     REACTION: intolerance    Past Medical History  Diagnosis Date  . 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 Hypertension   . CKD (chronic kidney disease)     BL Cr 1.6 - 2.3  .  ED (erectile dysfunction)   . Hernia   . Bilateral leg edema   . Hyperlipidemia   . Normocytic anemia     Hg baseline 11-12.   Past Surgical History No past surgical history on file.   Objective:   Physical Exam: Filed Vitals:   02/11/11 1535  BP: 137/75  Pulse: 72  Temp: 97.3 F (36.3 C)     General: Vital signs reviewed and noted. Well-developed, well-nourished, in no acute distress; alert, appropriate and cooperative  throughout examination.  Head: Normocephalic, atraumatic.  Eyes: conjunctivae/corneas clear.  lower eyelid with inflammation at base of eyelashes.  Neck: No deformities, masses, or tenderness noted.  Lungs:  Normal respiratory effort. Clear to auscultation BL without crackles or wheezes.  Heart: RRR. S1 and S2 normal without gallop, or rubs. (+) murmur  Abdomen:  BS normoactive. Soft, Nondistended, non-tender.  No masses or organomegaly.  Extremities: No pretibial edema.     Assessment & Plan:  Case and plan of care discussed with Dr. Blanch Media.

## 2011-02-16 LAB — HM DIABETES EYE EXAM

## 2011-02-16 NOTE — Assessment & Plan Note (Signed)
Health Maintenance  Topic Date Due  . Pneumococcal Polysaccharide Vaccine (#1) 09/10/1945  . Ophthalmology Exam  09/10/1953  . Tetanus/tdap  09/11/1962  . Colonoscopy  09/10/1993  . Zostavax  09/11/2003  . Influenza Vaccine  10/11/2010  . Hemoglobin A1c  03/23/2011  . Lipid Panel  12/23/2011  . Foot Exam  01/01/2012     Assessment:  Due for almost all preventative care exams - continues to refuse all of these.  Will be seeing ophtho next week for diabetic eye exam and tx of blepharitis  Plan:  Patient again was encouraged to complete his preventative care measures, and was explained the importance of staying up-to-date on his preventative care. He continued to refuse.  We'll continue to readdress at each visit, and continue to encourage

## 2011-02-16 NOTE — Assessment & Plan Note (Signed)
Labs: Lab Results  Component Value Date   HGBA1C 5.4 12/23/2010   HGBA1C 7.2 12/16/2009   CREATININE 2.45* 12/23/2010   CREATININE 1.92* 07/31/2009   MICROALBUR 14.06* 03/23/2009   MICRALBCREAT 215.6* 03/23/2009   CHOL 236* 12/23/2010   HDL 30* 12/23/2010   TRIG 92 12/23/2010    Last eye exam and foot exam:    Component Value Date/Time   HMDIABFOOTEX done 07/31/2009     Assessment:  Disease Control: controlled  Progress toward goals: at goal  Barriers to meeting goals: no barriers identified. He was stopped of actos last visit 2/2 very tightly controlled blood sugars, hypoglycemia, and LE swelling. He was also decreased of his glipizide. He is doing well on the new regimen.   Plan: Glucometer log was not reviewed today, as pt did not have glucometer available for review.      continue current medications  reminded to get eye exam  Can consider increasing glipizide dose again if blood sugars elevated in future.

## 2011-02-16 NOTE — Assessment & Plan Note (Signed)
BP Readings from Last 3 Encounters:  02/11/11 137/75  12/23/10 149/77  03/02/10 124/71    Basic Metabolic Panel:    Component Value Date/Time   NA 139 12/23/2010 1523   K 3.6 12/23/2010 1523   CL 104 12/23/2010 1523   CO2 25 12/23/2010 1523   BUN 28* 12/23/2010 1523   CREATININE 2.45* 12/23/2010 1523   CREATININE 1.92* 07/31/2009 2042   GLUCOSE 88 12/23/2010 1523   CALCIUM 9.1 12/23/2010 1523    Assessment: Hypertension Control: controlled  Progress toward goals: improved  Barriers to meeting goals: no barriers identified, he has stopped the lasix BID because of intolerable frequency of urination. As his medication compliance has improved, he is likely not requiring additional medication dosing.   Plan:  Go back to lasix once daily. Otherwise, continue other medications at current doses.

## 2011-02-16 NOTE — Assessment & Plan Note (Signed)
1. The patient was counseled on the dangers of tobacco use, and was advised to quit, although he continues to refuse. 2. Reviewed strategies to maximize success, including:  Removing cigarettes and smoking materials from environment  Stress management  Substitution of other forms of reinforcement Support of family/friends.  Selecting a quit date.

## 2011-02-16 NOTE — Assessment & Plan Note (Signed)
Assessment: Disease Control: not controlled  Progress toward goals: unchanged  Barriers to meeting goals: Patient has not followed any of the instructions provided during our last visit, including keeping eye area clean, dry, using warm compress, etc. Wants to wait for ophtho referral    Plan:      continue current recommendations  Await ophtho referral

## 2011-02-16 NOTE — Assessment & Plan Note (Signed)
Liver Function Tests:    Component Value Date/Time   AST 18 12/23/2010 1523   ALT 14 12/23/2010 1523   ALKPHOS 71 12/23/2010 1523   BILITOT 0.4 12/23/2010 1523   PROT 6.7 12/23/2010 1523   ALBUMIN 3.4* 12/23/2010 1523    Lipid Panel:     Component Value Date/Time   CHOL 236* 12/23/2010 1523   TRIG 92 12/23/2010 1523   HDL 30* 12/23/2010 1523   CHOLHDL 7.9 12/23/2010 1523   VLDL 18 12/23/2010 1523   LDLCALC 188* 12/23/2010 1523    Assessment: Lipid Control: not controlled  Progress toward goals: unable to assess  Barriers to meeting goals: nonadherence to medications, financial constraints. He frequently will miss medications due to inability to afford. I have offered to switch him to a different statin that would be more affordable, but he does not want to do that either. During his last visit, his FLP showed that he had uncontrolled LDL at 188, obviously not at goal. I called his pharmacy and he had not gotten his statin since 06/2010. Therefore, I think this is largely reflective of medication compliance.   Plan:  continue current medications  I offered to switch to cheaper statin - pt refuses.  Continue to encourage compliance

## 2011-02-24 ENCOUNTER — Encounter: Payer: Self-pay | Admitting: Internal Medicine

## 2011-02-28 ENCOUNTER — Other Ambulatory Visit: Payer: Self-pay | Admitting: *Deleted

## 2011-02-28 MED ORDER — AMLODIPINE BESYLATE 10 MG PO TABS: 10.0000 mg | ORAL_TABLET | Freq: Every day | ORAL | Status: DC

## 2011-02-28 MED ORDER — CARVEDILOL 25 MG PO TABS: 25.0000 mg | ORAL_TABLET | Freq: Two times a day (BID) | ORAL | Status: DC

## 2011-09-15 ENCOUNTER — Other Ambulatory Visit: Payer: Self-pay | Admitting: *Deleted

## 2011-09-15 MED ORDER — GLIPIZIDE 10 MG PO TABS: 10.0000 mg | ORAL_TABLET | Freq: Every day | ORAL | Status: DC

## 2011-09-15 NOTE — Telephone Encounter (Signed)
Needs an appt for additional refills (sent x 1 month)  Thank you! Ross Bauer, D.O., 09/15/2011, 3:17 PM

## 2011-09-15 NOTE — Telephone Encounter (Signed)
Message sent to front desk for an appt. 

## 2011-09-22 NOTE — Addendum Note (Signed)
Addended by: Priscella Mann on: 09/22/2011 08:48 PM   Modules accepted: Orders

## 2011-10-20 ENCOUNTER — Other Ambulatory Visit: Payer: Self-pay | Admitting: *Deleted

## 2011-10-20 MED ORDER — GLIPIZIDE 10 MG PO TABS: 10.0000 mg | ORAL_TABLET | Freq: Every day | ORAL | Status: DC

## 2011-10-20 NOTE — Telephone Encounter (Signed)
Talked with pt will make appt to see you.

## 2011-11-11 ENCOUNTER — Ambulatory Visit (INDEPENDENT_AMBULATORY_CARE_PROVIDER_SITE_OTHER): Payer: Medicare HMO | Admitting: Internal Medicine

## 2011-11-11 ENCOUNTER — Encounter: Payer: Self-pay | Admitting: Internal Medicine

## 2011-11-11 VITALS — BP 182/78 | HR 68 | Temp 97.0°F | Resp 20 | Ht 65.0 in | Wt 169.5 lb

## 2011-11-11 DIAGNOSIS — I10 Essential (primary) hypertension: Secondary | ICD-10-CM

## 2011-11-11 DIAGNOSIS — N259 Disorder resulting from impaired renal tubular function, unspecified: Secondary | ICD-10-CM

## 2011-11-11 DIAGNOSIS — E119 Type 2 diabetes mellitus without complications: Secondary | ICD-10-CM

## 2011-11-11 DIAGNOSIS — E785 Hyperlipidemia, unspecified: Secondary | ICD-10-CM

## 2011-11-11 DIAGNOSIS — Z79899 Other long term (current) drug therapy: Secondary | ICD-10-CM

## 2011-11-11 DIAGNOSIS — N289 Disorder of kidney and ureter, unspecified: Secondary | ICD-10-CM

## 2011-11-11 LAB — POCT GLYCOSYLATED HEMOGLOBIN (HGB A1C): Hemoglobin A1C: 5.7

## 2011-11-11 MED ORDER — SIMVASTATIN 10 MG PO TABS: 10.0000 mg | ORAL_TABLET | Freq: Every day | ORAL | Status: DC

## 2011-11-11 MED ORDER — FUROSEMIDE 40 MG PO TABS: 40.0000 mg | ORAL_TABLET | Freq: Every day | ORAL | Status: DC

## 2011-11-11 MED ORDER — AMLODIPINE BESYLATE 10 MG PO TABS: 10.0000 mg | ORAL_TABLET | Freq: Every day | ORAL | Status: DC

## 2011-11-11 MED ORDER — CARVEDILOL 25 MG PO TABS: 25.0000 mg | ORAL_TABLET | Freq: Two times a day (BID) | ORAL | Status: DC

## 2011-11-11 MED ORDER — GLIPIZIDE 10 MG PO TABS: 10.0000 mg | ORAL_TABLET | Freq: Every day | ORAL | Status: DC

## 2011-11-11 NOTE — Patient Instructions (Signed)
Please continue taking your medications until you are seen by Dr Thad Ranger  Please call back with any questions

## 2011-11-12 ENCOUNTER — Encounter: Payer: Self-pay | Admitting: Internal Medicine

## 2011-11-12 LAB — MICROALBUMIN / CREATININE URINE RATIO
Microalb Creat Ratio: 483.3 mg/g — ABNORMAL HIGH (ref 0.0–30.0)
Microalb, Ur: 56.31 mg/dL — ABNORMAL HIGH (ref 0.00–1.89)

## 2011-11-12 NOTE — Progress Notes (Deleted)
Subjective:   Patient ID: Ross Bauer male   DOB: Jun 18, 1945 66 y.o.   MRN: [id number]  HPI: [name] is a 66 y.o. gentleman with past medical history of diabetes, type II, hypertension, CAD, and hyperlipidemia, who presents to the clinic for refills of his medications.  He does not have any complaints today. He reports that his been taking his amlodipine glipizide, and carvedilol. However, he ran out of his of furosemide and he has not been taking it for about 3 months. He had been started in this medication 1 years ago. He also reports that his not taking his lovastatin since he ran out of it about 4 months ago.  He says that he will have an appointment with his PCP around December 2013.   Past Medical History  Diagnosis Date  . 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 Hypertension   . CKD (chronic kidney disease)     BL Cr 1.6 - 2.3  . ED (erectile dysfunction)   . Hernia   . Bilateral leg edema   . Hyperlipidemia   . Normocytic anemia     Hg baseline 11-12.   Current Outpatient Prescriptions  Medication Sig Dispense Refill  . amLODipine (NORVASC) 10 MG tablet Take 1 tablet (10 mg total) by mouth daily.  90 tablet  1  . aspirin 81 MG tablet Take 81 mg by mouth daily.        . carvedilol (COREG) 25 MG tablet Take 1 tablet (25 mg total) by mouth 2 (two) times daily with a meal.  180 tablet  2  . glipiZIDE (GLUCOTROL) 10 MG tablet Take 1 tablet (10 mg total) by mouth daily with breakfast.  30 tablet  0  . glucose blood (FREESTYLE LITE) test strip Use to check blood sugar 1-2 times a day.        . Lancets 30G MISC Use to check blood sugar 1-2 times daily.       . furosemide (LASIX) 40 MG tablet Take 1 tablet (40 mg total) by mouth daily.  30 tablet  1  . simvastatin (ZOCOR) 10 MG tablet Take 1 tablet (10 mg total) by mouth at bedtime.  30 tablet  1  . DISCONTD: rosuvastatin (CRESTOR) 40 MG tablet Take 1 tablet (40 mg total) by mouth daily.  30 tablet   5   Family History  Problem Relation Age of Onset  . Cancer Mother     died in 65s.  . Cancer Father     died in 17s   History   Social History  . Marital Status: Divorced    Spouse Name: N/A    Number of Children: 2  . Years of Education: N/A   Occupational History  . 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 Smoking status: Current Every Day Smoker -- 1.0 packs/day for 50 years    Types: Cigarettes  . Smokeless tobacco: None  . Alcohol Use: No  . Drug Use: No  . Sexually Active: None   Other Topics Concern  . None   Social History Narrative   Limited finances for which he frequently skips medications for a month or two.     Review of Systems: Review of Systems is negative.  He specifically denies history of orthopnea, PND, edema, or shortness of breath. He does not have any chest pains.  He denies history of cough, or palpitations. Objective:  Physical Exam: Filed Vitals:   11/11/11 1047  BP: 182/78  Pulse: 68  Temp: 97 F (36.1 C)  TempSrc: Oral  Resp: 20  Height: 5\' 5"  (1.651 m)  Weight: 169 lb 8 oz (76.885 kg)  SpO2: 96%   Physical Exam  Constitutional: He is oriented to person, place, and time. He appears well-developed. No distress.  HENT:  Head: Normocephalic and atraumatic.  Eyes: Conjunctivae normal and EOM are normal. Pupils are equal, round, and reactive to light.  Neck: Normal range of motion. Neck supple.  Cardiovascular: Normal rate, regular rhythm, normal heart sounds and intact distal pulses.  Exam reveals no gallop and no friction rub.   No murmur heard. Pulmonary/Chest: Effort normal and breath sounds normal.  Abdominal: Soft. Bowel sounds are normal. He exhibits no mass.  Musculoskeletal: Normal range of motion. He exhibits no edema and no tenderness.  Neurological: He is alert and oriented to person, place, and time.  Skin: Skin is warm and dry. No rash noted. He is not  diaphoretic. No erythema.    Physical Exam  Constitutional: He is oriented to person, place, and time. He appears well-developed. No distress.  HENT:  Head: Normocephalic and atraumatic.  Eyes: Conjunctivae normal and EOM are normal. Pupils are equal, round, and reactive to light.  Neck: Normal range of motion. Neck supple.  Cardiovascular: Normal rate, regular rhythm, normal heart sounds and intact distal pulses.  Exam reveals no gallop and no friction rub.   No murmur heard. Respiratory: Effort normal and breath sounds normal.  GI: Soft. Bowel sounds are normal. He exhibits no mass.  Musculoskeletal: Normal range of motion. He exhibits no edema and no tenderness.  Neurological: He is alert and oriented to person, place, and time.  Skin: Skin is warm and dry. No rash noted. He is not diaphoretic. No erythema.   Assessment & Plan:

## 2011-11-12 NOTE — Progress Notes (Signed)
Patient ID: Ross Bauer, male   DOB: 17-Jul-1945, 66 y.o.   MRN: [id number]  Subjective:   Patient ID: Ross Bauer male   DOB: 1945/11/15 66 y.o.   MRN: [id number]  HPI: Ross Bauer is a 66 y.o. gentleman with past medical history of diabetes, type II, hypertension, CAD, and hyperlipidemia, who presents to the clinic for refills of his medications.  He does not have any complaints today. He reports that his been taking his amlodipine glipizide, and carvedilol. However, he ran out of his of furosemide and he has not been taking it for about 3 months. He had been started in this medication 1 years ago. He also reports that his not taking his lovastatin since he ran out of it about 4 months ago.  He says that he will have an appointment with his PCP around December 2013.    Past Medical History  Diagnosis Date  . 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 Hypertension   . CKD (chronic kidney disease)     BL Cr 1.6 - 2.3  . ED (erectile dysfunction)   . Hernia   . Bilateral leg edema   . Hyperlipidemia   . Normocytic anemia     Hg baseline 11-12.   Current Outpatient Prescriptions  Medication Sig Dispense Refill  . amLODipine (NORVASC) 10 MG tablet Take 1 tablet (10 mg total) by mouth daily.  90 tablet  1  . aspirin 81 MG tablet Take 81 mg by mouth daily.        . carvedilol (COREG) 25 MG tablet Take 1 tablet (25 mg total) by mouth 2 (two) times daily with a meal.  180 tablet  2  . glipiZIDE (GLUCOTROL) 10 MG tablet Take 1 tablet (10 mg total) by mouth daily with breakfast.  30 tablet  0  . glucose blood (FREESTYLE LITE) test strip Use to check blood sugar 1-2 times a day.        . Lancets 30G MISC Use to check blood sugar 1-2 times daily.       . furosemide (LASIX) 40 MG tablet Take 1 tablet (40 mg total) by mouth daily.  30 tablet  1  . simvastatin (ZOCOR) 10 MG tablet Take 1 tablet (10 mg total) by mouth at bedtime.  30 tablet  1  . DISCONTD: rosuvastatin  (CRESTOR) 40 MG tablet Take 1 tablet (40 mg total) by mouth daily.  30 tablet  5   Family History  Problem Relation Age of Onset  . Cancer Mother     died in 79s.  . Cancer Father     died in 54s   History   Social History  . Marital Status: Divorced    Spouse Name: N/A    Number of Children: 2  . Years of Education: N/A   Occupational History  . 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 Smoking status: Current Every Day Smoker -- 1.0 packs/day for 50 years    Types: Cigarettes  . Smokeless tobacco: None  . Alcohol Use: No  . Drug Use: No  . Sexually Active: None   Other Topics Concern  . None   Social History Narrative   Limited finances for which he frequently skips medications for a month or two.     Review of Systems: Review of Systems  Constitutional: Negative for  fever, chills, weight loss, malaise/fatigue and diaphoresis.  HENT: Negative.   Eyes: Negative.   Respiratory: Negative for cough and wheezing.   Cardiovascular: Negative for chest pain, palpitations, orthopnea, claudication, leg swelling and PND.  Gastrointestinal: Negative.   Genitourinary: Negative.   Musculoskeletal: Negative.   Skin: Negative.   Neurological: Negative.   Endo/Heme/Allergies: Negative.   Psychiatric/Behavioral: Negative.    Objective:  Physical Exam: Filed Vitals:   11/11/11 1047  BP: 182/78  Pulse: 68  Temp: 97 F (36.1 C)  TempSrc: Oral  Resp: 20  Height: 5\' 5"  (1.651 m)  Weight: 169 lb 8 oz (76.885 kg)  SpO2: 96%   Physical Exam  Constitutional: He is oriented to person, place, and time. No distress.  Eyes: EOM are normal. Pupils are equal, round, and reactive to light.  Cardiovascular: Normal rate, regular rhythm and normal heart sounds.  Exam reveals no gallop and no friction rub.   No murmur heard. Pulmonary/Chest: Effort normal and breath sounds normal. No respiratory distress. He has no  wheezes. He has no rales. He exhibits no tenderness.  Abdominal: Soft. Bowel sounds are normal.  Musculoskeletal: He exhibits no edema and no tenderness.  Neurological: He is alert and oriented to person, place, and time.  Skin: Skin is warm and dry. He is not diaphoretic.  Psychiatric: He has a normal mood and affect. His behavior is normal. Judgment and thought content normal.   Assessment & Plan:  Assessment and plan for this patient has been discussed with Dr. Rogelia Boga and it is detailed under each problem. Refills have been provided for him with plan to f/u with his PCP in about one month.

## 2011-11-12 NOTE — Assessment & Plan Note (Addendum)
Blood pressure in the clinic today is elevated at 182/78. He reports that he has not been taking his furosemide however, his been compliant with amlodipine, and the carvedilol. I've refilled his amlodipine at 10 mg once daily, and to carvediolol 25 mg twice daily. I also restarted him on furosemide at 40 mg daily. Plan -He will followup with his PCP in about 2 months and at this time and adjustment of his medications will be made as necessary.

## 2011-11-12 NOTE — Assessment & Plan Note (Signed)
HbA1c is 5.7 today. However, her CBG is elevated at 245. He reports compliance with her glipizide.  Plan  -Given this good HbA1c, he will continue with his current dose of glipizide.

## 2011-11-12 NOTE — Assessment & Plan Note (Signed)
The patient is noted to have CKD with elevated creatinine of 2.5 ten months ago. This is most likely secondary to diabetic nephropathy given increased microalbuminuria. Unfortunately, the patient was unable to tolerate ACE inhibitors, which would offer nephro protection. One option for this patient is nondihydropyrimidine channel blocker which have been shown to offer some renal protection from diabetic nephropathy.   Plan   - He will follow up with PCP for possible referral to Nephrologist

## 2011-11-12 NOTE — Assessment & Plan Note (Signed)
He reports that he has not been taking his Rovustatin for the last months. I was requested to consider simvastatin as a preferable alternative in Epic.   Plan  - Switch to Simvastatin.

## 2011-11-17 NOTE — Progress Notes (Signed)
I saw, examined, and discussed the patient with Dr Kirtland Bouchard and agree with the note contained here.

## 2011-12-22 ENCOUNTER — Other Ambulatory Visit: Payer: Self-pay | Admitting: *Deleted

## 2011-12-22 DIAGNOSIS — E785 Hyperlipidemia, unspecified: Secondary | ICD-10-CM

## 2011-12-22 DIAGNOSIS — I10 Essential (primary) hypertension: Secondary | ICD-10-CM

## 2011-12-22 DIAGNOSIS — E119 Type 2 diabetes mellitus without complications: Secondary | ICD-10-CM

## 2011-12-22 MED ORDER — GLIPIZIDE 10 MG PO TABS: 10.0000 mg | ORAL_TABLET | Freq: Every day | ORAL | Status: DC

## 2012-01-09 ENCOUNTER — Other Ambulatory Visit: Payer: Self-pay | Admitting: *Deleted

## 2012-01-09 DIAGNOSIS — E785 Hyperlipidemia, unspecified: Secondary | ICD-10-CM

## 2012-01-09 DIAGNOSIS — I10 Essential (primary) hypertension: Secondary | ICD-10-CM

## 2012-01-09 DIAGNOSIS — E119 Type 2 diabetes mellitus without complications: Secondary | ICD-10-CM

## 2012-01-09 MED ORDER — GLIPIZIDE 10 MG PO TABS: 10.0000 mg | ORAL_TABLET | Freq: Every day | ORAL | Status: DC

## 2012-04-11 ENCOUNTER — Ambulatory Visit (INDEPENDENT_AMBULATORY_CARE_PROVIDER_SITE_OTHER): Payer: Medicare HMO | Admitting: Internal Medicine

## 2012-04-11 ENCOUNTER — Encounter: Payer: Self-pay | Admitting: Internal Medicine

## 2012-04-11 VITALS — BP 166/80 | HR 64 | Temp 97.0°F | Ht 65.0 in | Wt 174.7 lb

## 2012-04-11 DIAGNOSIS — D649 Anemia, unspecified: Secondary | ICD-10-CM

## 2012-04-11 DIAGNOSIS — E785 Hyperlipidemia, unspecified: Secondary | ICD-10-CM

## 2012-04-11 DIAGNOSIS — E119 Type 2 diabetes mellitus without complications: Secondary | ICD-10-CM

## 2012-04-11 DIAGNOSIS — Z0000 Encounter for general adult medical examination without abnormal findings: Secondary | ICD-10-CM

## 2012-04-11 DIAGNOSIS — I129 Hypertensive chronic kidney disease with stage 1 through stage 4 chronic kidney disease, or unspecified chronic kidney disease: Secondary | ICD-10-CM

## 2012-04-11 DIAGNOSIS — K148 Other diseases of tongue: Secondary | ICD-10-CM

## 2012-04-11 DIAGNOSIS — I10 Essential (primary) hypertension: Secondary | ICD-10-CM

## 2012-04-11 DIAGNOSIS — F17200 Nicotine dependence, unspecified, uncomplicated: Secondary | ICD-10-CM

## 2012-04-11 LAB — CBC WITH DIFFERENTIAL/PLATELET
Basophils Relative: 0 % (ref 0–1)
Eosinophils Absolute: 0.4 10*3/uL (ref 0.0–0.7)
Eosinophils Relative: 7 % — ABNORMAL HIGH (ref 0–5)
Lymphs Abs: 1.6 10*3/uL (ref 0.7–4.0)
MCH: 29 pg (ref 26.0–34.0)
MCHC: 33.5 g/dL (ref 30.0–36.0)
MCV: 86.4 fL (ref 78.0–100.0)
Neutrophils Relative %: 59 % (ref 43–77)
Platelets: 232 10*3/uL (ref 150–400)

## 2012-04-11 LAB — COMPREHENSIVE METABOLIC PANEL
Alkaline Phosphatase: 164 U/L — ABNORMAL HIGH (ref 39–117)
CO2: 20 mEq/L (ref 19–32)
Creat: 3.13 mg/dL — ABNORMAL HIGH (ref 0.50–1.35)
Glucose, Bld: 175 mg/dL — ABNORMAL HIGH (ref 70–99)
Sodium: 140 mEq/L (ref 135–145)
Total Bilirubin: 0.4 mg/dL (ref 0.3–1.2)
Total Protein: 6.9 g/dL (ref 6.0–8.3)

## 2012-04-11 LAB — LIPID PANEL
Cholesterol: 232 mg/dL — ABNORMAL HIGH (ref 0–200)
Total CHOL/HDL Ratio: 7 Ratio

## 2012-04-11 LAB — GLUCOSE, CAPILLARY: Glucose-Capillary: 180 mg/dL — ABNORMAL HIGH (ref 70–99)

## 2012-04-11 LAB — POCT GLYCOSYLATED HEMOGLOBIN (HGB A1C): Hemoglobin A1C: 6.2

## 2012-04-11 LAB — PHOSPHORUS: Phosphorus: 3.9 mg/dL (ref 2.3–4.6)

## 2012-04-11 MED ORDER — ATORVASTATIN CALCIUM 20 MG PO TABS: 20.0000 mg | ORAL_TABLET | Freq: Every day | ORAL | Status: DC

## 2012-04-11 MED ORDER — LISINOPRIL 20 MG PO TABS: 20.0000 mg | ORAL_TABLET | Freq: Every day | ORAL | Status: DC

## 2012-04-11 MED ORDER — GLIPIZIDE 5 MG PO TABS: 5.0000 mg | ORAL_TABLET | Freq: Every day | ORAL | Status: DC

## 2012-04-11 NOTE — Assessment & Plan Note (Addendum)
Pertinent Labs: Liver Function Tests:    Component Value Date/Time   AST 18 12/23/2010 1523   ALT 14 12/23/2010 1523   ALKPHOS 71 12/23/2010 1523   BILITOT 0.4 12/23/2010 1523   PROT 6.7 12/23/2010 1523   ALBUMIN 3.4* 12/23/2010 1523    Lipid Panel:     Component Value Date/Time   CHOL 236* 12/23/2010 1523   TRIG 92 12/23/2010 1523   HDL 30* 12/23/2010 1523   CHOLHDL 7.9 12/23/2010 1523   VLDL 18 12/23/2010 1523   LDLCALC 188* 12/23/2010 1523     Assessment: Goal LDL (per ATP guidelines): < 100 mg/dL  Disease Control: not controlled  Progress toward goals: unchanged  Barriers to meeting goals: lack of understanding of disease management and not taking statins in over 6 months bc does not like to take meds    Patient is fasting today.  The above LDL is OFF of therapy - he has never taken consistently.     Plan:  Start Lipitor 20 mg daily.  Will cancel all remaining simvastatin rx at [facility]  Lipid panel today.

## 2012-04-11 NOTE — Assessment & Plan Note (Addendum)
Assessment: Baseline hemoglobin of 11-12. Likely anemia of chronic disease in setting of his chronic kidney disease. Denies recent blood in stools, blood in urine.  Plan:      Check CBC today.  Has been recommended but continues to refuse colonoscopy.

## 2012-04-11 NOTE — Assessment & Plan Note (Signed)
Assessment: Patient has had this right sided tongue lesion for an unspecified amount of time. Likely related to his chronic tobacco abuse, however, given his extensive history of smoking, I would like for ENT to evaluate for any type of cancers.  Plan:      ENT referral.

## 2012-04-11 NOTE — Patient Instructions (Signed)
General Instructions:  Please follow-up at the clinic in 3  weeks, at which time we will reevaluate your diabetes, blood pressure - OR, please follow-up in the clinic sooner if needed.  There have been changes in your medications:  DECREASE glipizide to 5mg  daily for your diabetes.  STOP lasix  START lisinopril 20mg  once daily for your blood pressure  START lipitor 20mg  daily for your cholesterol - call me if it is too expensive   Go see the kidney doctors as soon as you can.  Go see the ear, nose, throat doctors as soon as you can.  Please see Rudell Cobb about the yellow card.  Please work on trying to decrease smoking - See some tips below.  If you have been started on new medication(s), and you develop symptoms concerning for allergic reaction, including, but not limited to, throat closing, tongue swelling, rash, please stop the medication immediately and call the clinic at [phone], and go to the ER.  If you are diabetic, please bring your meter to your next visit.  If symptoms worsen, or new symptoms arise, please call the clinic or go to the ER.  PLEASE BRING ALL OF YOUR MEDICATIONS  IN A BAG TO YOUR NEXT APPOINTMENT   Treatment Goals:  Goals (1 Years of Data) as of 04/11/12         As of Today 11/11/11 12/23/10     Result Component    . HEMOGLOBIN A1C < 7.0  6.2 5.7 5.4    . LDL CALC < 100    188      Progress Toward Treatment Goals:    Self Care Goals & Plans:  Self Care Goal 04/11/2012  Manage my medications take my medicines as prescribed; refill my medications on time; bring my medications to every visit  Monitor my health keep track of my blood glucose; bring my glucose meter and log to each visit  Stop smoking (No Data)    TIPS TO HELP YOU QUIT SMOKING 1-800-QUIT-NOW  This document explains the best ways for you to quit smoking and new treatments to help. It lists new medicines that can double or triple your chances of quitting and quitting for  good. It also considers ways to avoid relapses and concerns you may have about quitting, including weight gain.   Nicotine: A Powerful Addiction If you have tried to quit smoking, you know how hard it can be. It is hard because nicotine is a very addictive drug. For some people, it can be as addictive as heroin or cocaine. Usually, people make 2 or 3 tries, or more, before finally being able to quit. Each time you try to quit, you can learn about what helps and what hurts. Quitting takes hard work and a lot of effort, but you can quit smoking.   Quitting smoking is one of the most important things you will ever do You will live longer, feel better, and live better.  The impact on your body of quitting smoking is felt almost immediately:   Five keys to quitting: Studies have shown that these 5 steps will help you quit smoking and quit for good. You have the best chances of quitting if you use them together:   1. GET READY  Set a quit date.  Change your environment.  Get rid of ALL cigarettes, ashtrays, matches, and lighters in your home, car, and place of work.  Do not let people smoke in your home.  Review your past attempts  to quit. Think about what worked and what did not.  Once you quit, do not smoke. NOT EVEN A PUFF!   2. GET SUPPORT AND ENCOURAGEMENT  Tell your family, friends, and coworkers that you are going to quit and need their support. Ask them not to smoke around you.  Get individual, group, or telephone counseling and support.  Many smokers find one or more of the many self-help books available useful in helping them quit and stay off tobacco.   3. LEARN NEW SKILLS AND BEHAVIORS  Try to distract yourself from urges to smoke. Talk to someone, go for a walk, or occupy your time with a task.  When you first try to quit, change your routine. Take a different route to work. Drink tea instead of coffee. Eat breakfast in a different place.  Do something to reduce your stress. Take a  hot bath, exercise, or read a book.  Plan something enjoyable to do every day. Reward yourself for not smoking.  Explore interactive web-based programs that specialize in helping you quit.   4. GET MEDICINE AND USE IT CORRECTLY .  Medicines can help you stop smoking and decrease the urge to smoke. Combining medicine with the above behavioral methods and support can quadruple your chances of successfully quitting smoking.  Talk with your doctor about these options.  5. BE PREPARED FOR RELAPSE OR DIFFICULT SITUATIONS  Most relapses occur within the first 3 months after quitting. Do not be discouraged if you start smoking again. Remember, most people try several times before they finally quit.  You may have symptoms of withdrawal because your body is used to nicotine. You may crave cigarettes, be irritable, feel very hungry, cough often, get headaches, or have difficulty concentrating.  The withdrawal symptoms are only temporary. They are strongest when you first quit, but they will go away within 10 to 14 days.   Quitting takes hard work and a lot of effort, but you can quit smoking.   FOR MORE INFORMATION  Smokefree.gov (http://www.dav[employer].com/) provides free, accurate, evidence-based information and professional assistance to help support the immediate and long-term needs of people trying to quit smoking.  Document Released: 12/21/2000 Document Re-Released: 06/16/2009  [facility] Patient Information 2011 [place], [place].

## 2012-04-11 NOTE — Progress Notes (Signed)
Patient: Ross Bauer   MRN: [id number]  DOB: 15-May-1945  PCP: Priscella Mann, DO   Subjective:    HPI: [name] is a 67 y.o. male with a PMHx as outlined below, who presented to clinic today for the following:  1) DM2, controlled -  Lab Results  Component Value Date   HGBA1C 6.2 04/11/2012   Patient checking blood sugars very rarely. Currently taking glipizide 10mg  daily. Patient misses doses 0 x per week on average.  0 hypoglycemic episodes since last visit. Denies assisted hypoglycemia or recently hospitalizations for either hyper or hypoglycemia. denies polyuria, polydipsia, nausea, vomiting, diarrhea.  does request refills today.  2) HTN - Patient does not check blood pressure regularly at home. Currently taking amlodipine (Norvase), furosemide (Lasix) and carvedilol. States he is missing his lasix frequently because of causing excessive urination - taking it every other day. Denies headaches, dizziness, lightheadedness, chest pain, shortness of breath.   3) HLD - Previously prescribed statin with last LDL 199 in 12/2010. He has not been taking regularly and states has not taken it for last 6 months. Denies chest pain, difficulty breathing, palpitations, tachycardia, and muscle pains.   4) Tobacco abuse - continuing to smoke 1 ppd / 50 years. States not ready to quit yet, understands the long-term risks of smoking including, but not limited to, increased risk for lung cancer, strokes, heart attacks. Still not wanting to decrease intake.  5) Preventative care - he still remains to refuse most of his health maintenance exam, and continues to refuse all vaccinations and screening test. States he has gotten his eye exam with Dr. Dione Booze.   Review of Systems: Per HPI.   Current Outpatient Medications: Medication Sig  . amLODipine (NORVASC) 10 MG tablet Take 1 tablet (10 mg total) by mouth daily.  [name] aspirin 81 MG tablet Take 81 mg by mouth daily.    . carvedilol  (COREG) 25 MG tablet Take 1 tablet (25 mg total) by mouth 2 (two) times daily with a meal.  . furosemide (LASIX) 40 MG tablet Take 1 tablet (40 mg total) by mouth daily.  [name] glipiZIDE (GLUCOTROL) 10 MG tablet Take 1 tablet (10 mg total) by mouth daily with breakfast.    Allergies  Allergen Reactions  . Ace Inhibitors     REACTION: intolerance    Past Medical History  Diagnosis Date  . 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 Hypertension   . CKD (chronic kidney disease)     BL Cr 1.6 - 2.3  . ED (erectile dysfunction)   . Hernia   . Bilateral leg edema   . Hyperlipidemia   . Normocytic anemia     Hg baseline 11-12.    No past surgical history on file.   Objective:    Physical Exam: Filed Vitals:   04/11/12 0919  BP: 166/80  Pulse: 64  Temp:      General: Vital signs reviewed and noted. Well-developed, well-nourished, in no acute distress; alert, appropriate and cooperative throughout examination.  Head: Normocephalic, atraumatic.  Eyes: conjunctivae/corneas clear.  lower eyelid with inflammation at base of eyelashes.  Mouth: Largely edentulous with only one tooth in the front and a right posterior molar that is decayed. Stable stripe of brown-colored tongue lesion on the lateral posterior aspect of the tongue. Is not able to be scraped off.  Neck: No deformities, masses, or tenderness noted.  Lungs:  Normal respiratory effort. Clear to auscultation  BL without crackles or wheezes.  Heart: RRR. S1 and S2 normal without gallop, or rubs. (+) murmur  Abdomen:  BS normoactive. Soft, Nondistended, non-tender.  No masses or organomegaly.  Extremities: No pretibial edema.     Assessment/ Plan:    The patient's case and plan of care was discussed with attending physician, Dr. Aletta Edouard.

## 2012-04-11 NOTE — Assessment & Plan Note (Signed)
Pertinent Labs: Lab Results  Component Value Date   HGBA1C 6.2 04/11/2012   HGBA1C 7.2 12/16/2009   CREATININE 2.45* 12/23/2010   CREATININE 1.92* 07/31/2009   MICROALBUR 56.31* 11/11/2011   MICRALBCREAT 483.3* 11/11/2011   CHOL 236* 12/23/2010   HDL 30* 12/23/2010   TRIG 92 12/23/2010    Assessment: Disease Control:  Controlled  Progress toward goals:  At goal  Barriers to meeting goals: no barriers identified  Microvascular complications: nephropathy  Macrovascular complications: none  On aspirin: Yes    On statin: No: patient has been counseled extensively to start statin, but has not.  On ACE-I/ ARB: No: prior reported intolerance - however, pt cannot recall today, therefore, will be trialed on ACE-I.    Patient is compliant most of the time with prescribed medications.   Plan: Glucometer log was not reviewed today, as pt did not have glucometer available for review.   Given risk for hypoglycemia with worsenign renal function and A1c of 6.2 --> will decrease glipizide to 5mg  daily.  reminded to bring blood glucose meter & log to each visit  Educational resources provided:    Self management tools provided:    Home glucose monitoring recommendation:     check 2-3 times a week in the morning before breakfast. Also check when he is feeling poorly.

## 2012-04-11 NOTE — Assessment & Plan Note (Signed)
Health Maintenance  Topic Date Due  . Influenza Vaccine  [birth year]  . Tetanus/tdap  09/11/1962  . Colonoscopy  09/10/1993  . Zostavax  09/11/2003  . Pneumococcal Polysaccharide Vaccine Age 69 And Over  09/10/2008  . Lipid Panel  12/23/2011  . Hemoglobin A1c  02/11/2012  . Ophthalmology Exam  02/16/2012  . Foot Exam  09/21/2012  . Urine Microalbumin  11/10/2012    Assessment:  Procedures due: colonoscopy  Labs due: CMET, CBC, lipid panel  Immunizations due: flu, PCV, Tdap, zostavax  Plan:  Refuses all preventative care issues except for labs  Patient was counseled extensively regarding the importance of preventative care - specifically, we discussed importance of screening colonoscopy given his history of anemia and for further evaluation for any type of colonic abnormalities. We also discussed importance of vaccinations especially given his history of tobacco abuse and diabetes. Despite our prolonged discussion, the patient refused referral for colonoscopy or any vaccinations.  States he will consider to get pneumonia shot at the next visit.

## 2012-04-11 NOTE — Assessment & Plan Note (Signed)
  Assessment: 1. The patient was counseled on the dangers of tobacco use, which include, but are not limited to cardiovascular disease, increased cancer risk of multiple types of cancer, COPD, peripheral vascular disease, strokes. 2. He was also counseled on the benefits of smoking cessation. 3. Progress toward smoking cessation:   unchanged 4. Barriers to progress toward smoking cessation:   not interested  Plan: 1. Instruction/counseling given: The patient was firmly advised to quit and referred to a tobacco cessation program.   2. We also reviewed strategies to maximize success, including:  Removing cigarettes and smoking materials from environment  Stress management  Substitution of other forms of reinforcement Support of family/friends.  Selecting a quit date. 3. Educational resources provided:  handout 4. Self management tools provided:   5. Medications to assist with smoking cessation: none 6. Patient agreed to the following self-care plans for smoking cessation:  try to decrease amount of tobacco abuse

## 2012-04-11 NOTE — Assessment & Plan Note (Signed)
Pertinent Data: BP Readings from Last 3 Encounters:  04/11/12 166/80  11/11/11 182/78  02/11/11 137/75    Basic Metabolic Panel:    Component Value Date/Time   NA 139 12/23/2010 1523   K 3.6 12/23/2010 1523   CL 104 12/23/2010 1523   CO2 25 12/23/2010 1523   BUN 28* 12/23/2010 1523   CREATININE 2.45* 12/23/2010 1523   CREATININE 1.92* 07/31/2009 2042   GLUCOSE 88 12/23/2010 1523   CALCIUM 9.1 12/23/2010 1523    Assessment: Disease Control:  Uncontrolled  Progress toward goals:  Unchanged  Barriers to meeting goals: lack of understanding of disease management and taking lasix every other day because of too frequent urination.    Does not want to take too many medications - therefore, will need to work to reconcile to minimal list.  Avoiding thiazide diuretic given his CKD3.  I clarified with the patient that he does not have an intolerance to an ACE inhibitor. He denies prior rash, throat closing, difficulty breathing, cough with this medication.  Given his CK D., diabetes, proteinuria I think that ACE inhibitor would be warranted at this time. Will require very close followup.   Plan:  STOP lasix  Start lisinopril 20mg  daily - followup in 2 weeks for repeat BMET to assess stability of renal function.  Check BMET today.  Educational resources provided:    Self management tools provided:

## 2012-04-11 NOTE — Assessment & Plan Note (Addendum)
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 He has not followed with nephrology in past, but given progression of disease, I do think this is warranted. Will also continue to work on BP control to offset damage.  Plan:      CMET and Phos today.  Referral to nephrology for continued followup.   ADDENDUM TO PLAN AFTER LABS RESULTED: Pertinent Labs: Basic Metabolic Panel:  Recent Labs Lab 04/11/12    NA 140  K 4.6  CL 110  CO2 20  BUN 40*  CREATININE 3.13*  GLUCOSE 175*  PHOS 3.9   Assessment: The patient has had progressive renal decline since last assessment in December 2012. I called the patient regarding these results, and he does confirm that he takes excessive ibuprofen. As well this is likely affected by chronically uncontrolled hypertension in the setting of only intermittent medication compliance. Given his age, and concern for BPH, I do want to evaluate for obstructive causes well. Therefore, I will obtain a renal ultrasound. As well, will plan to obtain urinalysis with microscopy at the next visit. I initially had planned to start him on an ACE inhibitor at the last visit, he has not picked this up yet. Given his progressive renal failure, I will not start at this time, with concern of possibly potentiating worsening renal function versus hyperkalemia. Therefore, the patient was instructed to discontinue this medication as well.  Plan:  Stop all NSAIDs - the patient verbally expressed understanding of this.  I will mail him a copy of a list of NSAIDs so that he is sure to avoid these medications.  Return visit in one week for  recheck BMET and urinalysis with microscopy.  Ordered renal ultrasound - will attempt to get this done before his visit so that this is available for review during visit.  Renal referral pending - was ordered during last visit, will follow-up where we are in that process.

## 2012-04-13 NOTE — Progress Notes (Signed)
I have discussed this case with Dr. Reynolds , read the documentation and I agree with the plan of care. Please see the resident note for details of management.  

## 2012-04-18 ENCOUNTER — Encounter: Payer: Self-pay | Admitting: Internal Medicine

## 2012-04-18 NOTE — Addendum Note (Signed)
Addended by: Priscella Mann on: 04/18/2012 08:49 AM   Modules accepted: Orders, Medications

## 2012-04-23 ENCOUNTER — Ambulatory Visit (HOSPITAL_COMMUNITY)
Admission: RE | Admit: 2012-04-23 | Discharge: 2012-04-23 | Disposition: A | Discharge status: Home / Self Care | Payer: Medicare HMO | Source: Ambulatory Visit | Attending: Internal Medicine | Admitting: Internal Medicine

## 2012-04-23 DIAGNOSIS — N19 Unspecified kidney failure: Secondary | ICD-10-CM | POA: Insufficient documentation

## 2012-04-26 ENCOUNTER — Encounter: Payer: Self-pay | Admitting: Internal Medicine

## 2012-04-26 ENCOUNTER — Ambulatory Visit (INDEPENDENT_AMBULATORY_CARE_PROVIDER_SITE_OTHER): Payer: Medicare HMO | Admitting: Internal Medicine

## 2012-04-26 VITALS — BP 134/69 | HR 64 | Temp 96.8°F | Ht 65.0 in | Wt 170.7 lb

## 2012-04-26 DIAGNOSIS — E785 Hyperlipidemia, unspecified: Secondary | ICD-10-CM

## 2012-04-26 DIAGNOSIS — I10 Essential (primary) hypertension: Secondary | ICD-10-CM

## 2012-04-26 DIAGNOSIS — E119 Type 2 diabetes mellitus without complications: Secondary | ICD-10-CM

## 2012-04-26 LAB — BASIC METABOLIC PANEL
BUN: 50 mg/dL — ABNORMAL HIGH (ref 6–23)
Chloride: 112 mEq/L (ref 96–112)
Potassium: 4.7 mEq/L (ref 3.5–5.3)

## 2012-04-26 LAB — GLUCOSE, CAPILLARY: Glucose-Capillary: 177 mg/dL — ABNORMAL HIGH (ref 70–99)

## 2012-04-26 MED ORDER — AMLODIPINE BESYLATE 10 MG PO TABS: 10.0000 mg | ORAL_TABLET | Freq: Every day | ORAL | Status: DC

## 2012-04-26 NOTE — Patient Instructions (Signed)
General Instructions:  Please follow-up at the clinic in 3 months, at which time we will reevaluate YOUR BLOOD PRESSURE, DIABETES, AND CHOLESTEROL - OR, please follow-up in the clinic sooner if needed.  There have not been changes in your medications.   Start taking the Lipitor for cholesterol  See the kidney doctors as soon as you can.  STOP taking all antiinflammatories (see the list below)  If you have been started on new medication(s), and you develop symptoms concerning for allergic reaction, including, but not limited to, throat closing, tongue swelling, rash, please stop the medication immediately and call the clinic at [phone], and go to the ER.  If you are diabetic, please bring your meter to your next visit.  If symptoms worsen, or new symptoms arise, please call the clinic or go to the ER.  PLEASE BRING ALL OF YOUR MEDICATIONS  IN A BAG TO YOUR NEXT APPOINTMENT   Treatment Goals:  Goals (1 Years of Data) as of 04/26/12         As of Today 04/11/12 04/11/12 11/11/11 02/11/11     Blood Pressure     Blood Pressure < 140/90  134/69 166/80 179/77 182/78 137/75     Result Component     HEMOGLOBIN A1C < 7.0   6.2  5.7      LDL CALC < 100   185         Progress Toward Treatment Goals:  Treatment Goal 04/11/2012  Hemoglobin A1C at goal  Blood pressure deteriorated  Stop smoking smoking the same amount    Self Care Goals & Plans:  Self Care Goal 04/26/2012  Manage my medications bring my medications to every visit; refill my medications on time  Monitor my health keep track of my blood glucose; bring my glucose meter and log to each visit  Be physically active take a walk every day  Stop smoking (No Data)    Home Blood Glucose Monitoring 04/11/2012  Check my blood sugar once a day  When to check my blood sugar before breakfast     List of NSAIDS (Non-steroidal anti-inflammatory medications) DO NOT TAKE THESE MEDICATIONS  Choline magnesium trisalicylate  (Trilisate)  Diflunisal (Dolobid)  Celecoxib (Celebrex) Diclofenac (Cambia, Cataflam, Flector, Pennsaid, Solaraze Gel, Voltaren, Voltaren Gel, Voltaren-XR, Voltaren Ophthalmic, Zipsor)   Etodolac (Lodine, Lodine XL)  Fenoprofen (Nalfon)  Flurbiprofen (Ansaid)  Ibuprofen (Motrin, Advil, Nuprin)  Indomethacin (Indocin, Indocin SR)  Ketoprofen (Orudis, Actron, Oruvail)  Ketorolac (Sprix, Toradol)  Meclofenamate (Meclomen)  Meloxicam (Mobic)  Nabumetone (Relafen)  Naproxen (Naprosyn, Aleve, Anaprox, Naprelan)  Oxaprozin (Daypro)  Piroxicam (Feldene)  Salsalate (Salflex, Disalcid, Amigesic)  Sulindac (Clinoril)  Tolmetin (Tolectin)

## 2012-04-26 NOTE — Assessment & Plan Note (Signed)
Pertinent Data: Basic Metabolic Panel:    Component Value Date/Time   NA 140 04/11/2012 0922   K 4.6 04/11/2012 0922   CL 110 04/11/2012 0922   CO2 20 04/11/2012 0922   BUN 40* 04/11/2012 0922   CREATININE 3.13* 04/11/2012 0922   CREATININE 1.92* 07/31/2009 2042   GLUCOSE 175* 04/11/2012 0922   CALCIUM 8.6 04/11/2012 0922    Renal ultrasound (04/18/2012) - IMPRESSION: Normal study. Original Report Authenticated By: Signa Kell, M.D.  Assessment: Patient has progressive decline of his renal function since last year. This may be in part secondary to increased NSAID usage in addition to uncontrolled hypertension. He otherwise, has not had recent contrasted studies. He has had renal ultrasound with negative results as indicated above.   Plan:      Will recheck BMET and urinalysis with microscopy today.  Await nephrology evaluation.  Continue to hold ACE-I for now, K almost marginally high during last recheck.

## 2012-04-26 NOTE — Progress Notes (Signed)
   Patient: Ross Bauer   MRN: [id number]  DOB: [birth year]  PCP: Priscella Mann, DO   Subjective:    HPI: [name] is a 67 y.o. male with a PMHx as outlined below, who presented to clinic today for the following chief complaints:  1) CKDIV - during our last visit together, the patient was initially planned to be started on an ACE inhibitor for CTD, however after labs resulted, it seems that he had the patient was noted to have progressive decline in his renal function. Therefore, prior to the patient's picking up his medications, he was called and instructed NOT to start the medication. The patient didn't confirm that he had been recently taking increased amounts of NSAIDs. He therefore, was advised to avoid all NSAIDs. Additionally, he had renal ultrasound performed that was negative for evidence of increased echogenicity of the kidneys, hydronephrosis, or other renal lesions.   Today, the patient indicates that he has been eating and drinking well, denies any dysuria, hematuria, blood in his urine. Denies change in frequency, malodor,  States that he has stopped taking all NSAIDs as recommended previously. We reviewed together the renal ultrasound results. He has been referred to nephrology and is awaiting appointment.   Review of Systems: Per HPI.   Current Outpatient Medications: Medication Sig  . amLODipine (NORVASC) 10 MG tablet Take 1 tablet (10 mg total) by mouth daily.  [name] aspirin 81 MG tablet Take 81 mg by mouth daily.    [name] atorvastatin (LIPITOR) 20 MG tablet Take 1 tablet (20 mg total) by mouth daily.  . carvedilol (COREG) 25 MG tablet Take 1 tablet (25 mg total) by mouth 2 (two) times daily with a meal.  . glipiZIDE (GLUCOTROL) 5 MG tablet Take 1 tablet (5 mg total) by mouth daily with breakfast. For DIABETES  . glucose blood (FREESTYLE LITE) test strip Use to check blood sugar 1-2 times a day.    . Lancets 30G MISC Use to check blood sugar 1-2 times daily.      Allergies: Allergies  Allergen Reactions  . Ace Inhibitors     REACTION: intolerance    Past Medical History  Diagnosis Date  . 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 Hypertension   . CKD (chronic kidney disease)     BL Cr 1.6 - 2.3  . ED (erectile dysfunction)   . Hernia   . Bilateral leg edema   . Hyperlipidemia   . Normocytic anemia     Hg baseline 11-12.     Objective:    Physical Exam: Filed Vitals:   04/26/12 0901  BP: 134/69  Pulse: 64  Temp: 96.8 F (36 C)     General: Vital signs reviewed and noted. Well-developed, well-nourished, in no acute distress; alert, appropriate and cooperative throughout examination.  Head: Normocephalic, atraumatic.  Eyes: conjunctivae/corneas clear.  lower eyelid with inflammation at base of eyelashes.  Lungs:  Normal respiratory effort. Clear to auscultation BL without crackles or wheezes.  Heart: RRR. S1 and S2 normal without gallop, or rubs. (+) murmur  Abdomen:  BS normoactive. Soft, Nondistended, non-tender.  No masses or organomegaly.  Extremities: No pretibial edema.     Assessment/ Plan:   The patient's case and plan of care was discussed with attending physician, Dr. Debe Coder.

## 2012-04-27 LAB — URINALYSIS, ROUTINE W REFLEX MICROSCOPIC
Bilirubin Urine: NEGATIVE
Glucose, UA: NEGATIVE mg/dL
Protein, ur: 30 mg/dL — AB
Specific Gravity, Urine: 1.014 (ref 1.005–1.030)
Urobilinogen, UA: 0.2 mg/dL (ref 0.0–1.0)

## 2012-04-27 LAB — URINALYSIS, MICROSCOPIC ONLY
Crystals: NONE SEEN
Squamous Epithelial / LPF: NONE SEEN
WBC, UA: >50 WBC/hpf — AB (ref <3)

## 2012-04-29 NOTE — Progress Notes (Signed)
Quick Note:  Pt is asx, will therefore not start abx. ______

## 2012-05-14 NOTE — Progress Notes (Signed)
INTERNAL MEDICINE TEACHING ATTENDING ADDENDUM - Inez Catalina, MD: I reviewed with the resident Dr. Saralyn Pilar, Mr. Villarin'  medical history, physical examination, diagnosis and results of tests and treatment and I agree with the patient's care as documented.

## 2012-05-24 ENCOUNTER — Other Ambulatory Visit: Payer: Self-pay | Admitting: Internal Medicine

## 2012-05-24 MED ORDER — FUROSEMIDE 40 MG PO TABS: 40.0000 mg | ORAL_TABLET | Freq: Every day | ORAL | Status: DC

## 2012-05-24 NOTE — Progress Notes (Signed)
Lasix restarted by [name] (nephrology). Therefore, readded to his list. Pending SPEP, ANA, and 24-h urine protein and creatinine per [name].  [name], D.O., 05/24/2012, 1:13 PM

## 2012-06-16 ENCOUNTER — Other Ambulatory Visit: Payer: Self-pay | Admitting: Internal Medicine

## 2012-06-18 NOTE — Telephone Encounter (Signed)
We need to call with [facility] (had seen them for his CKD) because I do not know if he is still supposed to be on Lisinopril. I had him stop it during our last visit. I think he saw [name] - cannot find the note in the media section??  Please help.  Thanks.  Shelly

## 2012-06-19 ENCOUNTER — Encounter: Payer: Self-pay | Admitting: Dietician

## 2012-06-25 ENCOUNTER — Other Ambulatory Visit: Payer: Self-pay | Admitting: *Deleted

## 2012-06-25 DIAGNOSIS — I10 Essential (primary) hypertension: Secondary | ICD-10-CM

## 2012-06-25 MED ORDER — LISINOPRIL 20 MG PO TABS: 20.0000 mg | ORAL_TABLET | Freq: Every day | ORAL | Status: DC

## 2012-06-25 NOTE — Telephone Encounter (Signed)
i spoke to dr powell's nurse, dr Lowell Guitar approves of pt continuing lisinopril, please approve refill, bringing you office notes from dr Lowell Guitar

## 2012-07-19 ENCOUNTER — Other Ambulatory Visit: Payer: Self-pay

## 2012-08-03 NOTE — Addendum Note (Signed)
Addended by: Neomia Dear on: 08/03/2012 05:44 PM   Modules accepted: Orders

## 2012-08-31 ENCOUNTER — Other Ambulatory Visit: Payer: Self-pay | Admitting: *Deleted

## 2012-08-31 DIAGNOSIS — I10 Essential (primary) hypertension: Secondary | ICD-10-CM

## 2012-08-31 DIAGNOSIS — E785 Hyperlipidemia, unspecified: Secondary | ICD-10-CM

## 2012-08-31 DIAGNOSIS — E119 Type 2 diabetes mellitus without complications: Secondary | ICD-10-CM

## 2012-08-31 MED ORDER — CARVEDILOL 25 MG PO TABS: 25.0000 mg | ORAL_TABLET | Freq: Two times a day (BID) | ORAL | Status: DC

## 2012-09-04 ENCOUNTER — Other Ambulatory Visit: Payer: Self-pay | Admitting: Internal Medicine

## 2012-09-11 ENCOUNTER — Ambulatory Visit (INDEPENDENT_AMBULATORY_CARE_PROVIDER_SITE_OTHER): Payer: Medicare HMO | Admitting: Internal Medicine

## 2012-09-11 ENCOUNTER — Encounter: Payer: Self-pay | Admitting: Internal Medicine

## 2012-09-11 VITALS — BP 126/65 | HR 64 | Temp 97.8°F | Ht 65.0 in | Wt 159.7 lb

## 2012-09-11 DIAGNOSIS — I10 Essential (primary) hypertension: Secondary | ICD-10-CM

## 2012-09-11 DIAGNOSIS — E119 Type 2 diabetes mellitus without complications: Secondary | ICD-10-CM

## 2012-09-11 DIAGNOSIS — E785 Hyperlipidemia, unspecified: Secondary | ICD-10-CM

## 2012-09-11 LAB — GLUCOSE, CAPILLARY: Glucose-Capillary: 139 mg/dL — ABNORMAL HIGH (ref 70–99)

## 2012-09-11 MED ORDER — GLIPIZIDE 5 MG PO TABS: 5.0000 mg | ORAL_TABLET | Freq: Every day | ORAL | Status: DC

## 2012-09-11 MED ORDER — AMLODIPINE BESYLATE 10 MG PO TABS: 10.0000 mg | ORAL_TABLET | Freq: Every day | ORAL | Status: DC

## 2012-09-11 MED ORDER — FUROSEMIDE 40 MG PO TABS: 40.0000 mg | ORAL_TABLET | Freq: Every day | ORAL | Status: DC

## 2012-09-11 MED ORDER — ATORVASTATIN CALCIUM 20 MG PO TABS: 20.0000 mg | ORAL_TABLET | Freq: Every day | ORAL | Status: DC

## 2012-09-11 NOTE — Progress Notes (Signed)
Patient ID: Ross Bauer, male   DOB: [birth year], 67 y.o.   MRN: [id number]   Subjective:   Patient ID: Ross Bauer male   DOB: [birth year] 67 y.o.   MRN: [id number]  HPI: RossLamarr L Bauer is a 67 y.o. with PMH- HTN, DM2- controlled, CKD, hyperlipidemia, presented today with no complaints, but wanted some refill of his medications.  1- DM- Blood glucose today- 139, HBA1C- 6. Currently on Glipizide-5mg  dly. Does not check his blood sugars at home. Denies any hypoglycemic symptoms, says he is compliant with his medications.  2- HTN- Patients blood pressure today- 126/65.  Last BP= 04/26/2012- 134/ 69,  04/11/2012- 166/80. Patient is on 4 Bp lowering medications- Amlodipine- 10mg  dly, Carvedilol- 25mg  BID, Furosemide- 40mg  dly, Lisinopril- 20mg  dly. Patient claims compliance with all his medications. Last BMP done here- 04/26/2012 showed Na- 139, K- 4.7, Glucose- 151, BUN- 50, Cr- 3.09.   3- CKD- stage 3- (30- 59)patient is followed by [facility]. Last visit was April, and he was given a 6 mth appointment. No fistula or graft yet. Renal Uss- showed no obvious abnormalities. Pt says he has always had some mils swelling in his legs. Cr 24 hr clearance, done at [facility]- 04/2012 - 696 ( normal- 1000-2000). Hb- 11.8- 04/2012, with normal - MCV and MCH.  4- Hyperlipidemia- LDL- currently elevated at 185, TG- 71, 04/11/2012. Patient is on atorvastatin- 20mg . Claims compliance with his medication.  5- Health maintenance- Due for all his health screening, patient does not want any vaccines, or colonoscopy. Does not indicate a reason why, just says he is not interested despite counselling. But had his foot exam today, to make an appointment for his eye exam.   Past Medical History  Diagnosis Date  . 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 Hypertension   . CKD (chronic kidney disease)     BL Cr 1.6 - 2.3  . ED (erectile dysfunction)   . Hernia   .  Bilateral leg edema   . Hyperlipidemia   . Normocytic anemia     Hg baseline 11-12.   Current Outpatient Prescriptions  Medication Sig Dispense Refill  . amLODipine (NORVASC) 10 MG tablet Take 1 tablet (10 mg total) by mouth daily.  90 tablet  2  . aspirin 81 MG tablet Take 81 mg by mouth daily.        [name] atorvastatin (LIPITOR) 20 MG tablet Take 1 tablet (20 mg total) by mouth daily.  90 tablet  2  . carvedilol (COREG) 25 MG tablet Take 1 tablet (25 mg total) by mouth 2 (two) times daily with a meal.  180 tablet  3  . furosemide (LASIX) 40 MG tablet Take 1 tablet (40 mg total) by mouth daily.  30 tablet  17  . glipiZIDE (GLUCOTROL) 5 MG tablet Take 1 tablet (5 mg total) by mouth daily with breakfast. For DIABETES  90 tablet  2  . glucose blood (FREESTYLE LITE) test strip Use to check blood sugar 1-2 times a day.        . Lancets 30G MISC Use to check blood sugar 1-2 times daily.       [name] lisinopril (PRINIVIL,ZESTRIL) 20 MG tablet Take 1 tablet (20 mg total) by mouth daily.  90 tablet  1  . [DISCONTINUED] rosuvastatin (CRESTOR) 40 MG tablet Take 1 tablet (40 mg total) by mouth daily.  30 tablet  5   No current facility-administered medications for  this visit.   Family History  Problem Relation Age of Onset  . Cancer Mother     died in 86s.  . Cancer Father     died in 80s   History   Social History  . Marital Status: Divorced    Spouse Name: N/A    Number of Children: 2  . Years of Education: N/A   Occupational History  . 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 Smoking status: Current Every Day Smoker -- 1.00 packs/day for 50 years    Types: Cigarettes  . Smokeless tobacco: None  . Alcohol Use: No  . Drug Use: No  . Sexual Activity: None   Other Topics Concern  . None   Social History Narrative   Limited finances for which he frequently skips medications for a month or two.           Review of  Systems: CONSTITUTIONAL- No Fever, weightloss, night sweat or change in appetite. SKIN- No Rash, colour changes,or itching. HEAD- No Headache,or dizziness. Mouth/throat- No Sorethroat, wears dentures, no bleeding gums. RESPIRATORY- no Cough or SOB. CARDIAC- no Palpitations, DOE, PND,  Or chest pain. GI- No nausea, vomiting, diarrhoea, constipation, abd pain. URINARY- No Frequency, or dysuria. NEUROLOGIC- No Numbness, syncope, or burning.  Objective:  Physical Exam: Filed Vitals:   09/11/12 1401  BP: 126/65  Pulse: 64  Temp: 97.8 F (36.6 C)  TempSrc: Oral  Height: 5\' 5"  (1.651 m)  Weight: 159 lb 11.2 oz (72.439 kg)  SpO2: 97%   Physical Exam GENERAL- alert, co-operative, appears as stated age, not in any distress. HEENT- Atraumatic, normocephalic, PERRL, EOMI, oral mucosa appears dry, no cervical LN enlargement, thyroid does not appear enlarged. CARDIAC- RRR, no murmurs, rubs or gallops. RESP- Moving equal volumes of air, and clear to auscultation bilaterally. ABDOMEN- Soft, nontender, no palpable masses or organomegaly, bowel sounds present. BACK- Normal curvature of the spine, No tenderness along the vertebrae, no CVA tenderness. NEURO- Cr N 2-12 intact, strenght equal and present in all extremities,  EXTREMITIES- pulse 2+, reduced, +1 very mild pitting edema to his ankles,  SKIN- Warm, dry, No rash or lesion. Psych- approriate speech and thought content.  Assessment & Plan:  The patient's case and plan of care was discussed with attending physician, Dr. Irine Seal.   Hyperlipidemia- LDL clearly not at goal, but concerns for patient renal function, as atorvastatin should be used in caution in pt with renal impairment and risk of inducing rhadomyolysis. Patient declined lipid profile check today, to determine effectiveness of atorvastatin- as this was started on his last visit in April. He said he is in a hurry and would rather do it on his next visit. - Refills of  medications- amylodipine, Lasix, Glipizide, lipitor given to patient today.  CKD-  To keep his appointment with his Nephrologist as previously scheduled.  Health screening- Continues to decline health screening despite counselling- except for HBA1c, foot and eye exam.  DM and HTN- Patient encouraged to continue taking his medications as prescribed.

## 2012-09-11 NOTE — Patient Instructions (Signed)
Your blood pressure today is really good, also your blood sugar is well controlled. Please make an appointment to follow up for your eye exam, this is very important. Also continue taking your medication as prescribed. We will like to check your blood cholesterol levels today, and depending on the results we will call you back and increase the dose of your cholesterol medication. Also follow up with your kidney doctor as previously scheduled.

## 2012-09-14 NOTE — Progress Notes (Signed)
I saw and evaluated the patient.  I personally confirmed the key portions of the history and exam documented by Dr. Emokpae and I reviewed pertinent patient test results.  The assessment, diagnosis, and plan were formulated together and I agree with the documentation in the resident's note. 

## 2012-11-22 ENCOUNTER — Telehealth: Payer: Self-pay | Admitting: Dietician

## 2012-11-22 NOTE — Telephone Encounter (Signed)
Patient says he has not made an appointment for his eye exam yet. Offered to do retinal photos here, patient says he will call us when he is ready to schedule.

## 2012-12-20 ENCOUNTER — Other Ambulatory Visit: Payer: Self-pay | Admitting: Internal Medicine

## 2013-03-05 ENCOUNTER — Encounter: Payer: Medicare HMO | Admitting: Internal Medicine

## 2013-04-22 ENCOUNTER — Telehealth: Payer: Self-pay | Admitting: *Deleted

## 2013-04-22 NOTE — Telephone Encounter (Signed)
Will see pt tomorrow at 9:00

## 2013-04-22 NOTE — Telephone Encounter (Signed)
Pt called with c/o cold for 5 days, he is using lemon and honey but not better. He has non-productive cough. Delsym is not helping.  No other c/o Denies fever, sore throat, he is eating well.   Advised to drink plenty of fluids, rest. He has a scheduled appointment on 4/21. I asked him to call back for fever for productive cough.    Please advise Pt # [phone]

## 2013-04-22 NOTE — Telephone Encounter (Signed)
I would ask the patient to get his clinic appointment earlier than scheduled. It is difficult to advise over the phone.

## 2013-04-23 ENCOUNTER — Ambulatory Visit (INDEPENDENT_AMBULATORY_CARE_PROVIDER_SITE_OTHER): Payer: Medicare HMO | Admitting: Internal Medicine

## 2013-04-23 ENCOUNTER — Encounter: Payer: Self-pay | Admitting: Internal Medicine

## 2013-04-23 VITALS — BP 164/76 | HR 78 | Temp 97.5°F | Ht 65.0 in | Wt 168.5 lb

## 2013-04-23 DIAGNOSIS — J069 Acute upper respiratory infection, unspecified: Secondary | ICD-10-CM | POA: Insufficient documentation

## 2013-04-23 DIAGNOSIS — J00 Acute nasopharyngitis [common cold]: Secondary | ICD-10-CM

## 2013-04-23 MED ORDER — GUAIFENESIN ER 600 MG PO TB12: 600.0000 mg | ORAL_TABLET | Freq: Two times a day (BID) | ORAL | Status: DC

## 2013-04-23 MED ORDER — LORATADINE-PSEUDOEPHEDRINE ER 5-120 MG PO TB12: 1.0000 | ORAL_TABLET | Freq: Two times a day (BID) | ORAL | Status: DC

## 2013-04-23 NOTE — Assessment & Plan Note (Signed)
Symptoms and clinical signs consistent with acute upper respiratory infection mostly viral etiology.  Plans: Conservative management Antihistamines and decongestants to help with runny nose and congestion Guaifenesin  Follow up prn if symptoms persist or do not improve.

## 2013-04-23 NOTE — Progress Notes (Signed)
Subjective:   Patient ID: Ross Bauer male   DOB: 03/24/1945 68 y.o.   MRN: [id number]  HPI: [name] is a 68 y.o. gentleman with PMH significant for HTN, DM-II, HLD, CKD comes to the office with CC of runny nose, cough for the last 5 days.  Patient reports that his symptoms started about a week ago, with runny nose and cough. His cough is occasionally productive with whitish colored phlegm but mostly he is unable to cough up the phlegm. Patient denies any fever, sore throat, headaches but reports chills. Patient states that his grandson had a flu few weeks and he thinks he may have caught that. He denies any SOB, chest pain, diarrhea. He reports taking OTC Delsym without any relief.  He denies any other complaints.    Past Medical History  Diagnosis Date  . 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 Hypertension   . CKD (chronic kidney disease)     BL Cr 1.6 - 2.3  . ED (erectile dysfunction)   . Hernia   . Bilateral leg edema   . Hyperlipidemia   . Normocytic anemia     Hg baseline 11-12.   Current Outpatient Prescriptions  Medication Sig Dispense Refill  . amLODipine (NORVASC) 10 MG tablet Take 1 tablet (10 mg total) by mouth daily.  90 tablet  2  . aspirin 81 MG tablet Take 81 mg by mouth daily.        [name]. atorvastatin (LIPITOR) 20 MG tablet Take 1 tablet (20 mg total) by mouth daily.  90 tablet  2  . carvedilol (COREG) 25 MG tablet Take 1 tablet (25 mg total) by mouth 2 (two) times daily with a meal.  180 tablet  3  . furosemide (LASIX) 40 MG tablet Take 1 tablet (40 mg total) by mouth daily.  30 tablet  17  . glipiZIDE (GLUCOTROL) 5 MG tablet Take 1 tablet (5 mg total) by mouth daily with breakfast. For DIABETES  90 tablet  2  . glucose blood (FREESTYLE LITE) test strip Use to check blood sugar 1-2 times a day.        . Lancets 30G MISC Use to check blood sugar 1-2 times daily.       [name]. lisinopril (PRINIVIL,ZESTRIL) 20 MG tablet TAKE ONE TABLET BY  MOUTH ONCE DAILY  90 tablet  3  . [DISCONTINUED] rosuvastatin (CRESTOR) 40 MG tablet Take 1 tablet (40 mg total) by mouth daily.  30 tablet  5   No current facility-administered medications for this visit.   Family History  Problem Relation Age of Onset  . Cancer Mother     died in 9170s.  . Cancer Father     died in 9170s   History   Social History  . Marital Status: Divorced    Spouse Name: N/A    Number of Children: 2  . Years of Education: N/A   Occupational History  . 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 Smoking status: Current Every Day Smoker -- 1.00 packs/day for 50 years    Types: Cigarettes  . Smokeless tobacco: None  . Alcohol Use: No  . Drug Use: No  . Sexual Activity: None   Other Topics Concern  . None   Social History Narrative   Limited finances for which he frequently skips medications for a month  or two.           Review of Systems: Pertinent items are noted in HPI. Objective:  Physical Exam: Filed Vitals:   04/23/13 0904  BP: 164/76  Pulse: 78  Temp: 97.5 F (36.4 C)  TempSrc: Oral  Height: 5\' 5"  (1.651 m)  Weight: 168 lb 8 oz (76.431 kg)  SpO2: 95%   Constitutional: Vital signs reviewed.  Patient is a well-developed and well-nourished and in no acute distress and cooperative with exam. Alert and oriented x3.  Head: Normocephalic and atraumatic Ear: TM normal bilaterally Nose: Mild erythema noted in both the nostrils and no discharge noted.Turbinates normal Mouth: Mild erythema of the posterior pharynx noted and no exudates, MMM Sinuses: No sinus tenderness noted. Cardiovascular: RRR, S1 normal, S2 normal, no MRG, pulses symmetric and intact bilaterally Pulmonary/Chest: normal respiratory effort, CTAB, no wheezes, rales, or rhonchi   Assessment & Plan:

## 2013-04-23 NOTE — Patient Instructions (Signed)
Pick up the prescriptions from your pharmacy and start taking the medications from today.  Take all the other medications as advised. If your symptoms do not improve or worsen, please give us a call or seek medical help.

## 2013-04-25 NOTE — Progress Notes (Signed)
Case discussed with Dr. Boggala at the time of the visit.  We reviewed the resident's history and exam and pertinent patient test results.  I agree with the assessment, diagnosis, and plan of care documented in the resident's note. 

## 2013-04-30 ENCOUNTER — Encounter: Payer: Self-pay | Admitting: Internal Medicine

## 2013-04-30 ENCOUNTER — Ambulatory Visit (INDEPENDENT_AMBULATORY_CARE_PROVIDER_SITE_OTHER): Payer: Medicare HMO | Admitting: Internal Medicine

## 2013-04-30 VITALS — BP 126/68 | HR 72 | Temp 97.4°F | Ht 65.0 in | Wt 161.7 lb

## 2013-04-30 DIAGNOSIS — E119 Type 2 diabetes mellitus without complications: Secondary | ICD-10-CM

## 2013-04-30 DIAGNOSIS — N183 Chronic kidney disease, stage 3 unspecified: Secondary | ICD-10-CM

## 2013-04-30 DIAGNOSIS — E785 Hyperlipidemia, unspecified: Secondary | ICD-10-CM

## 2013-04-30 DIAGNOSIS — I10 Essential (primary) hypertension: Secondary | ICD-10-CM

## 2013-04-30 DIAGNOSIS — I129 Hypertensive chronic kidney disease with stage 1 through stage 4 chronic kidney disease, or unspecified chronic kidney disease: Secondary | ICD-10-CM

## 2013-04-30 DIAGNOSIS — Z0000 Encounter for general adult medical examination without abnormal findings: Secondary | ICD-10-CM

## 2013-04-30 LAB — BASIC METABOLIC PANEL
BUN: 40 mg/dL — AB (ref 6–23)
CALCIUM: 8.5 mg/dL (ref 8.4–10.5)
CO2: 25 meq/L (ref 19–32)
CREATININE: 2.83 mg/dL — AB (ref 0.50–1.35)
Chloride: 103 mEq/L (ref 96–112)
GLUCOSE: 111 mg/dL — AB (ref 70–99)
Potassium: 4.3 mEq/L (ref 3.5–5.3)
Sodium: 137 mEq/L (ref 135–145)

## 2013-04-30 LAB — LIPID PANEL
Cholesterol: 133 mg/dL (ref 0–200)
HDL: 24 mg/dL — AB (ref >39)
LDL Cholesterol: 93 mg/dL (ref 0–99)
Total CHOL/HDL Ratio: 5.5 Ratio
Triglycerides: 81 mg/dL (ref <150)
VLDL: 16 mg/dL (ref 0–40)

## 2013-04-30 LAB — GLUCOSE, CAPILLARY: GLUCOSE-CAPILLARY: 130 mg/dL — AB (ref 70–99)

## 2013-04-30 LAB — POCT GLYCOSYLATED HEMOGLOBIN (HGB A1C): Hemoglobin A1C: 6.9

## 2013-04-30 MED ORDER — LISINOPRIL 20 MG PO TABS: ORAL_TABLET | ORAL | Status: DC

## 2013-04-30 MED ORDER — CARVEDILOL 25 MG PO TABS: 25.0000 mg | ORAL_TABLET | Freq: Two times a day (BID) | ORAL | Status: DC

## 2013-04-30 MED ORDER — ASPIRIN 81 MG PO TABS
81.0000 mg | ORAL_TABLET | Freq: Every day | ORAL | Status: DC
Start: 2013-04-30 — End: 2017-03-10

## 2013-04-30 MED ORDER — ATORVASTATIN CALCIUM 20 MG PO TABS: 40.0000 mg | ORAL_TABLET | Freq: Every day | ORAL | Status: DC

## 2013-04-30 MED ORDER — AMLODIPINE BESYLATE 10 MG PO TABS: 10.0000 mg | ORAL_TABLET | Freq: Every day | ORAL | Status: DC

## 2013-04-30 NOTE — Assessment & Plan Note (Addendum)
Assessment- Blood pressure at goal today. Denies symptoms of dizziness, no falls. Smokes cigarettes, one pack per day, smoked since he was a teenager, not ready to quit. Counselled on the importance of quiting smoking.  Vitals - 1 value per visit 04/30/2013 04/23/2013 09/11/2012  SYSTOLIC 126 [id number]  DIASTOLIC 68 76 65    Plan- Continue- lisinopril 20 mg, amlodipine 10 mg daily, Lasix 40 mg daily, carvdilol 25 mg twice a day. - Basal metabolic profile today - Medication refill. - See in 3 months.

## 2013-04-30 NOTE — Assessment & Plan Note (Signed)
Assessment-  CKD now stage IV, no signs of fluid overload. Pt on Lasix 40 mg daily, says he is compliant with medication. Seen by Dr. Lowell GuitarPowell- Nephrology, appointment 07/04/2012. Patient says he will make his own appointment.   Plan- patient clearly needs to follow with nephrology but he insists that he would do this by himself. - Continue Lasix 20 mg daily.  - BMET today.

## 2013-04-30 NOTE — Patient Instructions (Addendum)
We will be doing some blood work on you today.  We will also be increasing the dose of one of the medications you are taking- Called atorvastatin, you will now be taking 40mg  tablet, this is to help with your cholesterol levels.  Please make an appointment to see your kidney doctor. Also please please make an appointment to do your eye exam, as soon as you can.  Also it is very important your check your blood sugar levels. Please bring your glucometer next time for your appointment.

## 2013-04-30 NOTE — Assessment & Plan Note (Addendum)
Assessment- last LDL elevated at 185- 04/11/2012. Currently on atorvastatin 20 mg daily.  Plan- increase dose of atorvastatin to-40 mg daily - Repeat panel today - Occasional refill

## 2013-04-30 NOTE — Assessment & Plan Note (Signed)
Assessment- patient is due for colonoscopy and due for eye exam. Objects to a colonoscopy, says he doesn't want to do it despite counseling on the importance.  Plan- For retinal eye exam, pt says he will scheduled this himself within the next 2 months.

## 2013-04-30 NOTE — Progress Notes (Signed)
Patient ID: Ross Bauer, male   DOB: 11/21/1945, 68 y.o.   MRN: [id number]   Subjective:   Patient ID: Ross ManifoldJulius L Lamos male   DOB: 06/21/1945 68 y.o.   MRN: [id number]  HPI: Ross Bauer is a 68 y.o. PMH of DM, HTN, HLD, CKD stage 4. No complaints today. Presented for routine medical check up and refill of meds.  Please see problem based chatting for review of his Chronic Medical Problems.   Past Medical History  Diagnosis Date  . 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 Hypertension   . CKD (chronic kidney disease)     BL Cr 1.6 - 2.3  . ED (erectile dysfunction)   . Hernia   . Bilateral leg edema   . Hyperlipidemia   . Normocytic anemia     Hg baseline 11-12.   Current Outpatient Prescriptions  Medication Sig Dispense Refill  . amLODipine (NORVASC) 10 MG tablet Take 1 tablet (10 mg total) by mouth daily.  90 tablet  3  . aspirin 81 MG tablet Take 1 tablet (81 mg total) by mouth daily.  90 tablet  3  . atorvastatin (LIPITOR) 20 MG tablet Take 2 tablets (40 mg total) by mouth daily.  90 tablet  2  . carvedilol (COREG) 25 MG tablet Take 1 tablet (25 mg total) by mouth 2 (two) times daily with a meal.  180 tablet  3  . furosemide (LASIX) 40 MG tablet Take 1 tablet (40 mg total) by mouth daily.  30 tablet  17  . glipiZIDE (GLUCOTROL) 5 MG tablet Take 1 tablet (5 mg total) by mouth daily with breakfast. For DIABETES  90 tablet  2  . glucose blood (FREESTYLE LITE) test strip Use to check blood sugar 1-2 times a day.        [name]. guaiFENesin (MUCINEX) 600 MG 12 hr tablet Take 1 tablet (600 mg total) by mouth 2 (two) times daily.  60 tablet  0  . Lancets 30G MISC Use to check blood sugar 1-2 times daily.       [name]. lisinopril (PRINIVIL,ZESTRIL) 20 MG tablet TAKE ONE TABLET BY MOUTH ONCE DAILY  90 tablet  3  . loratadine-pseudoephedrine (CLARITIN-D 12 HOUR) 5-120 MG per tablet Take 1 tablet by mouth 2 (two) times daily.  60 tablet  0  . [DISCONTINUED] rosuvastatin  (CRESTOR) 40 MG tablet Take 1 tablet (40 mg total) by mouth daily.  30 tablet  5   No current facility-administered medications for this visit.   Family History  Problem Relation Age of Onset  . Cancer Mother     died in 6370s.  . Cancer Father     died in 7370s   History   Social History  . Marital Status: Divorced    Spouse Name: N/A    Number of Children: 2  . Years of Education: N/A   Occupational History  . 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 Smoking status: Current Every Day Smoker -- 1.00 packs/day for 50 years    Types: Cigarettes  . Smokeless tobacco: None  . Alcohol Use: No  . Drug Use: No  . Sexual Activity: None   Other Topics Concern  . None   Social History Narrative   Limited finances for which he frequently skips medications for a month or two.  Review of Systems: CONSTITUTIONAL- No Fever, weightloss, night sweat, no changes in appetite. SKIN- No Rash, colour changes, or itching. HEAD- No Headache, or  dizziness. EYES- Vision loss, pain, redness, double or blurred vision. RESPIRATORY- Cough- present for about a week, Upper respiratory infection, resolving,  No SOB. CARDIAC- No Palpitations, DOE, PND, or chest pain. GI- No nausea, vomiting, diarrhoea, or constipation. URINARY- No Frequency, polyuria, nocturia, or hesitancy. NEUROLOGIC- No Numbness, syncope or burning.  Objective:  Physical Exam: Filed Vitals:   04/30/13 1338  BP: 126/68  Pulse: 72  Temp: 97.4 F (36.3 C)  TempSrc: Oral  Height: 5\' 5"  (1.651 m)  Weight: 161 lb 11.2 oz (73.347 kg)  SpO2: 97%   GENERAL- alert, co-operative, appears as stated age, not in any distress. HEENT- Atraumatic, normocephalic, PERRL, EOMI, oral mucosa appears moist, no cervical LN enlargement, thyroid does not appear enlarged. CARDIAC- RRR, no murmurs, rubs or gallops. RESP- Moving equal volumes of air, and clear to  auscultation bilaterally. ABDOMEN- Soft, nontender, no palpable masses or organomegaly, bowel sounds present. BACK- Normal curvature of the spine, No tenderness along the vertebrae, no CVA tenderness. NEURO- No obvious Cr N abnormality, strenght equal and present in all extremities, finger to nose test normal bilat, rapid alternating movement- intact EXTREMITIES- pulse 2+, symmetric, no pedal edema. SKIN- Warm, dry, No rash or lesion. PSYCH- Normal mood and affect, appropriate thought content and speech.   Assessment & Plan:   The patient's case and plan of care was discussed with attending physician, Dr. Tenny CrawN. Narendra.    Please see problem based charting  For assessment and plan.

## 2013-04-30 NOTE — Assessment & Plan Note (Addendum)
Assessment- hemoglobin A1c- 6.9 today. Denies hypoglycemics symptoms. Doesn't check his blood sugars at home, did not come with glucometer.   Plan- Continue glipizide 5 mg daily.  - If her hemoglobin A1c is less than 6.5,consider reducing dose of glipizide to 2.5 mg daily.

## 2013-05-01 LAB — MICROALBUMIN / CREATININE URINE RATIO
CREATININE, URINE: 164.7 mg/dL
MICROALB UR: 7.83 mg/dL — AB (ref 0.00–1.89)
MICROALB/CREAT RATIO: 47.5 mg/g — AB (ref 0.0–30.0)

## 2013-05-01 NOTE — Progress Notes (Signed)
INTERNAL MEDICINE TEACHING ATTENDING ADDENDUM - Charlett Merkle, MD: I reviewed and discussed at the time of visit with the resident Dr. Emokpae, the patient's medical history, physical examination, diagnosis and results of tests and treatment and I agree with the patient's care as documented. 

## 2013-06-06 ENCOUNTER — Other Ambulatory Visit: Payer: Self-pay | Admitting: *Deleted

## 2013-06-07 ENCOUNTER — Telehealth: Payer: Self-pay | Admitting: *Deleted

## 2013-06-07 DIAGNOSIS — I10 Essential (primary) hypertension: Secondary | ICD-10-CM

## 2013-06-07 DIAGNOSIS — E785 Hyperlipidemia, unspecified: Secondary | ICD-10-CM

## 2013-06-07 DIAGNOSIS — E119 Type 2 diabetes mellitus without complications: Secondary | ICD-10-CM

## 2013-06-07 MED ORDER — ATORVASTATIN CALCIUM 40 MG PO TABS: 40.0000 mg | ORAL_TABLET | Freq: Every day | ORAL | Status: DC

## 2013-06-07 MED ORDER — GLUCOSE BLOOD VI STRP: ORAL_STRIP | Status: DC

## 2013-06-07 NOTE — Telephone Encounter (Signed)
I changed prescription to 40 mg once daily.  Please inform patient of the change in tablet strength and the instructions.

## 2013-06-07 NOTE — Telephone Encounter (Signed)
[facility] pharmacy states pt's insurance will not pay Lipitor 20mg  BID but will pay for 40mg  daily. Thanks

## 2013-06-10 ENCOUNTER — Other Ambulatory Visit: Payer: Self-pay | Admitting: *Deleted

## 2013-06-10 MED ORDER — GLUCOSE BLOOD VI STRP: ORAL_STRIP | Status: DC

## 2013-06-10 NOTE — Telephone Encounter (Signed)
Message sent to front office to schedule pt an appt. 

## 2013-06-10 NOTE — Telephone Encounter (Signed)
Please schedule a follow-up appointment with me when available. 

## 2013-06-10 NOTE — Telephone Encounter (Signed)
Pt states he needs new rx for [employer] Strips for insurance purposes. Thanks

## 2013-07-31 ENCOUNTER — Encounter: Payer: Self-pay | Admitting: Internal Medicine

## 2013-07-31 ENCOUNTER — Ambulatory Visit (INDEPENDENT_AMBULATORY_CARE_PROVIDER_SITE_OTHER): Payer: Medicare HMO | Admitting: Internal Medicine

## 2013-07-31 VITALS — BP 164/70 | HR 72 | Temp 97.6°F | Ht 65.0 in | Wt 171.3 lb

## 2013-07-31 DIAGNOSIS — N183 Chronic kidney disease, stage 3 unspecified: Secondary | ICD-10-CM

## 2013-07-31 DIAGNOSIS — E785 Hyperlipidemia, unspecified: Secondary | ICD-10-CM

## 2013-07-31 DIAGNOSIS — I10 Essential (primary) hypertension: Secondary | ICD-10-CM

## 2013-07-31 DIAGNOSIS — E119 Type 2 diabetes mellitus without complications: Secondary | ICD-10-CM

## 2013-07-31 DIAGNOSIS — F17200 Nicotine dependence, unspecified, uncomplicated: Secondary | ICD-10-CM

## 2013-07-31 LAB — CBC WITH DIFFERENTIAL/PLATELET
Basophils Absolute: 0 10*3/uL (ref 0.0–0.1)
Basophils Relative: 0 % (ref 0–1)
EOS ABS: 0.2 10*3/uL (ref 0.0–0.7)
EOS PCT: 4 % (ref 0–5)
HCT: 33.8 % — ABNORMAL LOW (ref 39.0–52.0)
HEMOGLOBIN: 11.6 g/dL — AB (ref 13.0–17.0)
LYMPHS ABS: 1.7 10*3/uL (ref 0.7–4.0)
LYMPHS PCT: 29 % (ref 12–46)
MCH: 30.8 pg (ref 26.0–34.0)
MCHC: 34.3 g/dL (ref 30.0–36.0)
MCV: 89.7 fL (ref 78.0–100.0)
MONOS PCT: 7 % (ref 3–12)
Monocytes Absolute: 0.4 10*3/uL (ref 0.1–1.0)
NEUTROS PCT: 60 % (ref 43–77)
Neutro Abs: 3.5 10*3/uL (ref 1.7–7.7)
Platelets: 226 10*3/uL (ref 150–400)
RBC: 3.77 MIL/uL — AB (ref 4.22–5.81)
RDW: 14 % (ref 11.5–15.5)
WBC: 5.9 10*3/uL (ref 4.0–10.5)

## 2013-07-31 LAB — COMPLETE METABOLIC PANEL WITH GFR
ALT: 8 U/L (ref 0–53)
AST: 10 U/L (ref 0–37)
Albumin: 3.5 g/dL (ref 3.5–5.2)
Alkaline Phosphatase: 123 U/L — ABNORMAL HIGH (ref 39–117)
BILIRUBIN TOTAL: 0.5 mg/dL (ref 0.2–1.2)
BUN: 39 mg/dL — ABNORMAL HIGH (ref 6–23)
CO2: 24 mEq/L (ref 19–32)
CREATININE: 2.63 mg/dL — AB (ref 0.50–1.35)
Calcium: 8.5 mg/dL (ref 8.4–10.5)
Chloride: 108 mEq/L (ref 96–112)
GFR, Est African American: 27 mL/min — ABNORMAL LOW
GFR, Est Non African American: 24 mL/min — ABNORMAL LOW
Glucose, Bld: 219 mg/dL — ABNORMAL HIGH (ref 70–99)
Potassium: 4.6 mEq/L (ref 3.5–5.3)
Sodium: 140 mEq/L (ref 135–145)
Total Protein: 6.9 g/dL (ref 6.0–8.3)

## 2013-07-31 LAB — GLUCOSE, CAPILLARY: GLUCOSE-CAPILLARY: 238 mg/dL — AB (ref 70–99)

## 2013-07-31 LAB — POCT GLYCOSYLATED HEMOGLOBIN (HGB A1C): HEMOGLOBIN A1C: 7.2

## 2013-07-31 MED ORDER — FUROSEMIDE 40 MG PO TABS: 40.0000 mg | ORAL_TABLET | Freq: Every day | ORAL | Status: DC

## 2013-07-31 MED ORDER — GLIPIZIDE 5 MG PO TABS
5.0000 mg | ORAL_TABLET | Freq: Every day | ORAL | Status: DC
Start: 2013-07-31 — End: 2014-01-21

## 2013-07-31 MED ORDER — ATORVASTATIN CALCIUM 40 MG PO TABS: 40.0000 mg | ORAL_TABLET | Freq: Every day | ORAL | Status: DC

## 2013-07-31 NOTE — Addendum Note (Signed)
Addended by: Margarito LinerJOINES, Vernice Bowker on: 07/31/2013 02:50 PM   Modules accepted: Orders

## 2013-07-31 NOTE — Assessment & Plan Note (Signed)
Lab Results  Component Value Date   CREATININE 2.83* 04/30/2013   CREATININE 3.09* 04/26/2012   CREATININE 3.13* 04/11/2012     Assessment: Patient has chronic kidney disease attributed by his nephrologist Dr. Lowell GuitarPowell to long-standing hypertensive nephrosclerosis.  It appears that he has not been seen by Dr. Lowell GuitarPowell in over a year.  Plan: Check labs today including a comprehensive metabolic panel, CBC with differential, and urinalysis; refer to Dr. Lowell GuitarPowell for ongoing management of his chronic kidney disease.

## 2013-07-31 NOTE — Assessment & Plan Note (Signed)
  Assessment: Progress toward smoking cessation:  smoking the same amount Barriers to progress toward smoking cessation:  lack of motivation to quit Comments: Patient acknowledged need to quit smoking, but said that he is not yet ready  Plan: Instruction/counseling given:  I counseled patient on the dangers of tobacco use, advised patient to stop smoking, and reviewed strategies to maximize success. Educational resources provided:  [place] (1-800-QUIT-NOW) brochure Medications to assist with smoking cessation:  Declined by patient

## 2013-07-31 NOTE — Progress Notes (Signed)
   Subjective:    Patient ID: Ross Bauer, male    DOB: 07/09/1945, 68 y.o.   MRN: [id number]  HPI This is the first visit to my continuity clinic for [name], a 68 year old man who presents for management of his type 2 diabetes mellitus, hypertension, hyperlipidemia, and other chronic medical problems.  He was previously followed by Dr. Mariea ClontsEmokpae as his primary care physician, and was recently reassigned to my panel of primary care patients.  Today he has no acute complaints, and reports that he has been doing well.  He initially did not bring his medications to clinic, but upon my request retrieved them so that I could review his medications.  This review shows that he is not currently taking the lisinopril, and apparently it has been several months since he ran out of that medication.  He is followed by nephrologist Dr. Lowell GuitarPowell for his chronic kidney disease; the last note we have is from May of 2014.  At the time of his initial arrival to clinic today, he had not taken his morning blood pressure medications; he did take these medications about one half prior to the last recheck of his blood pressure in clinic this morning.   Review of Systems  Respiratory: Negative for cough and shortness of breath.   Cardiovascular: Negative for chest pain and leg swelling.  Gastrointestinal: Negative for nausea, vomiting, abdominal pain and blood in stool.  Endocrine: Negative for polydipsia, polyphagia and polyuria.  Genitourinary: Negative for dysuria, frequency and difficulty urinating.       Objective:   Physical Exam  Constitutional: No distress.  Cardiovascular: Normal rate, regular rhythm and normal heart sounds.  Exam reveals no gallop and no friction rub.   No murmur heard. Pulmonary/Chest: Effort normal and breath sounds normal. No respiratory distress. He has no wheezes. He has no rales. He exhibits no tenderness.  Abdominal: Soft. Bowel sounds are normal. He exhibits no distension. There  is no tenderness. There is no rebound and no guarding.  Musculoskeletal: He exhibits no edema.        Assessment & Plan:

## 2013-07-31 NOTE — Assessment & Plan Note (Signed)
Lab Results  Component Value Date   HGBA1C 7.2 07/31/2013   HGBA1C 6.9 04/30/2013   HGBA1C 6.0 09/11/2012     Assessment: Diabetes control: fair control Progress toward A1C goal:  deteriorated Comments:  Hemoglobin A1c is slightly above goal on glipizide 5 mg daily, but patient has recently been out of of the glipizide.  Plan: Medications:  Refill glipizide 5 mg daily and continue at current dose Home glucose monitoring: Frequency: once a day Timing: before breakfast Instruction/counseling given: reminded to get eye exam, reminded to bring blood glucose meter & log to each visit and reminded to bring medications to each visit Educational resources provided: brochure  Other plans: Refer to patient's ophthalmologist Dr. Dione BoozeGroat

## 2013-07-31 NOTE — Assessment & Plan Note (Signed)
BP Readings from Last 3 Encounters:  07/31/13 164/70  04/30/13 126/68  04/23/13 164/76    Lab Results  Component Value Date   NA 137 04/30/2013   K 4.3 04/30/2013   CREATININE 2.83* 04/30/2013    Assessment: Blood pressure control: moderately elevated Progress toward BP goal:  deteriorated Comments: Patient's blood pressure is moderately elevated on repeat measurement taken 30 minutes after he took his morning medications.  Although lisinopril is on his medication list, he has not been taking that medication for several months.  The allergy list shows an intolerance to lisinopril.  Given his degree of chronic kidney disease, and ACE inhibitor may not be the best option for improving his blood pressure control.  Given the improvement today shortly after taking his medications, it is not clear how much additional control he will need.  Plan: Medications:  continue current medications (amlodipine 10 mg daily, carvedilol 25 mg twice a day, and furosemide 40 mg daily).  I advised patient to take these medications regularly and to return in one week for reassessment of his blood pressure.  If needed at that point, can consider adding a low dose of clonidine to his regimen.  I would like to discuss further with his nephrologist before resuming an ACE inhibitor, given his chronic kidney disease and apparent history of intolerance to ACEIs. Educational resources provided: handout

## 2013-07-31 NOTE — Assessment & Plan Note (Signed)
Lipids:    Component Value Date/Time   CHOL 133 04/30/2013 1434   TRIG 81 04/30/2013 1434   HDL 24* 04/30/2013 1434   LDLCALC 93 04/30/2013 1434   VLDL 16 04/30/2013 1434   CHOLHDL 5.5 04/30/2013 1434    Assessment: LDL is at goal of atorvastatin 40 mg daily.  Patient reports no apparent side effects to the statin medication.  Plan: Continue atorvastatin 40 mg daily.

## 2013-07-31 NOTE — Patient Instructions (Signed)
General Instructions: Please bring all of your medications into the clinic this afternoon or tomorrow so that I can review them and then make adjustments in your blood pressure and diabetes medications if needed. Please schedule a followup appointment with your eye doctor for annual eye exam. Please followup with your kidney doctor for management of your chronic kidney disease. Please set a quit date and stop smoking.    Treatment Goals:  Goals (1 Years of Data) as of 07/31/13         As of Today As of Today 04/30/13 04/23/13 09/11/12     Blood Pressure    . Blood Pressure < 140/90  190/68 196/84 126/68 164/76 126/65     Result Component    . HEMOGLOBIN A1C < 7.0  7.2  6.9  6.0    . LDL CALC < 100    93        Progress Toward Treatment Goals:  Treatment Goal 07/31/2013  Hemoglobin A1C deteriorated  Blood pressure deteriorated  Stop smoking smoking the same amount    Self Care Goals & Plans:  Self Care Goal 07/31/2013  Manage my medications bring my medications to every visit  Monitor my health bring my glucose meter and log to each visit  Eat healthy foods eat foods that are low in salt; eat baked foods instead of fried foods  Be physically active -  Stop smoking -    Home Blood Glucose Monitoring 07/31/2013  Check my blood sugar once a day  When to check my blood sugar before breakfast     Care Management & Community Referrals:  Referral 07/31/2013  Referrals made to community resources none

## 2013-08-01 ENCOUNTER — Telehealth: Payer: Self-pay | Admitting: Internal Medicine

## 2013-08-01 DIAGNOSIS — R8281 Pyuria: Secondary | ICD-10-CM

## 2013-08-01 LAB — URINALYSIS, MICROSCOPIC ONLY
CASTS: NONE SEEN
Crystals: NONE SEEN
WBC, UA: >50 WBC/hpf — AB (ref <3)

## 2013-08-01 LAB — URINALYSIS, ROUTINE W REFLEX MICROSCOPIC
Bilirubin Urine: NEGATIVE
GLUCOSE, UA: NEGATIVE mg/dL
Ketones, ur: NEGATIVE mg/dL
NITRITE: NEGATIVE
PH: 6.5 (ref 5.0–8.0)
Protein, ur: 30 mg/dL — AB
SPECIFIC GRAVITY, URINE: 1.011 (ref 1.005–1.030)
Urobilinogen, UA: 1 mg/dL (ref 0.0–1.0)

## 2013-08-01 NOTE — Telephone Encounter (Signed)
Telephone Contact Note  Urinalysis    Component Value Date/Time   COLORURINE YELLOW 07/31/2013 0930   APPEARANCEUR CLOUDY* 07/31/2013 0930   LABSPEC 1.011 07/31/2013 0930   PHURINE 6.5 07/31/2013 0930   GLUCOSEU NEG 07/31/2013 0930   GLUCOSEU NEG mg/dL 07/31/2009 2042   HGBUR TRACE* 07/31/2013 0930   BILIRUBINUR NEG 07/31/2013 0930   KETONESUR NEG 07/31/2013 0930   PROTEINUR 30* 07/31/2013 0930   UROBILINOGEN 1 07/31/2013 0930   NITRITE NEG 07/31/2013 0930   LEUKOCYTESUR LARGE* 07/31/2013 0930    Lab Results  Component Value Date   EPIU RARE 07/31/2013   CRYST NONE SEEN 07/31/2013   LABCAST NONE SEEN 07/31/2013   WBCU >50* 07/31/2013   RBCU 3-6* 07/31/2013   BACTERIA MANY* 07/31/2013     Assessment: Urinalysis done yesterday 07/31/2013 was notable for trace hemoglobin, large leukocytes, WBCs greater than 50, RBCs 3-6, bacteria many.  I called patient and discussed the results with him today.  He denies any dysuria, frequency, or other urinary symptoms.    Plan: Patient will come in to the lab tomorrow and leave a specimen for urine culture; will base further workup and/or treatment on that result. 

## 2013-08-01 NOTE — Progress Notes (Signed)
Quick Note:  Addendum: See telephone note dated 08/01/2013. ______

## 2013-08-01 NOTE — Assessment & Plan Note (Signed)
Telephone Contact Note  Urinalysis    Component Value Date/Time   COLORURINE YELLOW 07/31/2013 0930   APPEARANCEUR CLOUDY* 07/31/2013 0930   LABSPEC 1.011 07/31/2013 0930   PHURINE 6.5 07/31/2013 0930   GLUCOSEU NEG 07/31/2013 0930   GLUCOSEU NEG mg/dL 4/09/81197/22/2011 [id number]   HGBUR TRACE* 07/31/2013 0930   BILIRUBINUR NEG 07/31/2013 0930   KETONESUR NEG 07/31/2013 0930   PROTEINUR 30* 07/31/2013 0930   UROBILINOGEN 1 07/31/2013 0930   NITRITE NEG 07/31/2013 0930   LEUKOCYTESUR LARGE* 07/31/2013 0930    Lab Results  Component Value Date   EPIU RARE 07/31/2013   CRYST NONE SEEN 07/31/2013   LABCAST NONE SEEN 07/31/2013   WBCU >50* 07/31/2013   RBCU 3-6* 07/31/2013   BACTERIA MANY* 07/31/2013     Assessment: Urinalysis done yesterday 07/31/2013 was notable for trace hemoglobin, large leukocytes, WBCs greater than 50, RBCs 3-6, bacteria many.  I called patient and discussed the results with him today.  He denies any dysuria, frequency, or other urinary symptoms.    Plan: Patient will come in to the lab tomorrow and leave a specimen for urine culture; will base further workup and/or treatment on that result.

## 2013-08-02 ENCOUNTER — Other Ambulatory Visit: Payer: Medicare HMO

## 2013-08-02 DIAGNOSIS — R8281 Pyuria: Secondary | ICD-10-CM

## 2013-08-04 LAB — URINE CULTURE: Colony Count: >=100000

## 2013-08-05 ENCOUNTER — Telehealth: Payer: Self-pay | Admitting: Internal Medicine

## 2013-08-05 DIAGNOSIS — R8271 Bacteriuria: Secondary | ICD-10-CM

## 2013-08-05 NOTE — Assessment & Plan Note (Signed)
Telephone Contact Note  Assessment: Urine culture grew greater than 100,000 colonies per mL of Enterobacter aerogenes.  I called patient and talked with him at length today; I discussed in detail the possible symptoms of UTI, and he denies any symptoms of urinary tract infection.  Plan: Current guidelines recommend against antibiotic treatment for asymptomatic bacteriuria.  I advised patient to call right away if he develops any symptoms of UTI.  He will follow up in my clinic in 2 days. 

## 2013-08-05 NOTE — Telephone Encounter (Signed)
Telephone Contact Note  Assessment: Urine culture grew greater than 100,000 colonies per mL of Enterobacter aerogenes.  I called patient and talked with him at length today; I discussed in detail the possible symptoms of UTI, and he denies any symptoms of urinary tract infection.  Plan: Current guidelines recommend against antibiotic treatment for asymptomatic bacteriuria.  I advised patient to call right away if he develops any symptoms of UTI.  He will follow up in my clinic in 2 days.

## 2013-08-07 ENCOUNTER — Ambulatory Visit (INDEPENDENT_AMBULATORY_CARE_PROVIDER_SITE_OTHER): Payer: Medicare HMO | Admitting: Internal Medicine

## 2013-08-07 ENCOUNTER — Encounter: Payer: Self-pay | Admitting: Internal Medicine

## 2013-08-07 VITALS — BP 140/68 | HR 58 | Temp 97.9°F | Ht 65.0 in | Wt 168.9 lb

## 2013-08-07 DIAGNOSIS — E119 Type 2 diabetes mellitus without complications: Secondary | ICD-10-CM

## 2013-08-07 DIAGNOSIS — I10 Essential (primary) hypertension: Secondary | ICD-10-CM

## 2013-08-07 DIAGNOSIS — N183 Chronic kidney disease, stage 3 unspecified: Secondary | ICD-10-CM

## 2013-08-07 DIAGNOSIS — N3000 Acute cystitis without hematuria: Secondary | ICD-10-CM

## 2013-08-07 MED ORDER — LEVOFLOXACIN 250 MG PO TABS: 250.0000 mg | ORAL_TABLET | Freq: Every day | ORAL | Status: DC

## 2013-08-07 NOTE — Patient Instructions (Addendum)
Take levofloxacin 250 mg one tablet daily for 5 days. Reduce glipizide dose to one-half of a 5 mg glipizide tablet daily for the next 5 days while taking the levofloxacin.

## 2013-08-07 NOTE — Assessment & Plan Note (Signed)
BP Readings from Last 3 Encounters:  08/07/13 140/68  07/31/13 164/70  04/30/13 126/68    Lab Results  Component Value Date   NA 140 07/31/2013   K 4.6 07/31/2013   CREATININE 2.63* 07/31/2013    Assessment: Blood pressure control: controlled Progress toward BP goal:  at goal Comments: acceptable control on amlodipine 10 mg daily, carvedilol 25 mg twice a day, and furosemide 40 mg daily  Plan: Medications:  continue current medications

## 2013-08-07 NOTE — Assessment & Plan Note (Signed)
Lab Results  Component Value Date   CREATININE 2.63* 07/31/2013   CREATININE 2.83* 04/30/2013   CREATININE 3.09* 04/26/2012     Assessment: Patient has chronic kidney disease attributed by his nephrologist Dr. Lowell GuitarPowell to long-standing hypertensive nephrosclerosis, and he has not been seen by Dr. Lowell GuitarPowell in over a year.  His renal function is stable.  Plan: Referral to his nephrologist Dr. Lowell GuitarPowell for ongoing management of his chronic kidney disease is [facility] approval [facility].

## 2013-08-07 NOTE — Assessment & Plan Note (Signed)
Assessment: As previously noted, patient had pyuria and bacteria on urinalysis done 07/31/2013, and urine culture done 08/02/2013 grew greater than 100,000 colonies per mL of Enterobacter aerogenes.  Patient reports urinary frequency and has to go urinate about every 2 hours, which is more than baseline for him.  He has no fever, chills, abdominal pain, nausea, vomiting, or dysuria.  Plan: Given urinary frequency, along with pyuria and bacteria as well as urine culture growing Enterobacter aerogenes, the plan is to treat for 5 days with levofloxacin 250 mg daily (dose adjusted for renal function).  I advised patient to call or return if he has worsening of symptoms or any new symptoms.

## 2013-08-07 NOTE — Progress Notes (Signed)
   Subjective:    Patient ID: Ross Bauer, male    DOB: 03/25/1945, 68 y.o.   MRN: [id number]  HPI Patient returns for followup and management of his hypertension, bacteria with pyuria, chronic kidney disease, and other medical problems.  Today he has no acute complaints.  On review of systems he does acknowledge urinary frequency, and says that he goes to urinate about every 2 hours.  He reports that he has been compliant with his medications since his last visit here, and that he has taken his antihypertensive medication this morning.  He also reports that he has cut out sweet tea over the past week   Review of Systems  Constitutional: Negative for fever, chills and diaphoresis.  Genitourinary: Positive for frequency. Negative for dysuria.       Objective:   Physical Exam  Constitutional: No distress.  Cardiovascular: Normal rate, regular rhythm and normal heart sounds.  Exam reveals no gallop and no friction rub.   No murmur heard. Pulmonary/Chest: Effort normal and breath sounds normal. No respiratory distress. He has no wheezes. He has no rales.  Abdominal: Soft. Bowel sounds are normal. There is no tenderness. There is no CVA tenderness.  Musculoskeletal: He exhibits no edema.        Assessment & Plan:

## 2013-08-07 NOTE — Assessment & Plan Note (Signed)
Lab Results  Component Value Date   HGBA1C 7.2 07/31/2013   HGBA1C 6.9 04/30/2013   HGBA1C 6.0 09/11/2012     Assessment: Diabetes control: fair control Progress toward A1C goal:  unchanged Comments: Patient reports cutting back significantly on the amount of sweet tea he has been drinking.  Plan: Medications:  Given concern about potential interaction between glipizide and the levofloxacin I am prescribing for urinary tract infection, I advised patient to decrease his glipizide dose to one half of a 5 mg tablet daily for the next 5 days while he is taking levofloxacin, and then to resume his previous glipizide dose of 5 mg one tablet daily. Home glucose monitoring: Frequency: once a day Timing: before breakfast Instruction/counseling given: reminded to get eye exam and discussed diet Other plans: Referral to patient's ophthalmologist for diabetic eye exam [facility] .[name]

## 2013-08-28 ENCOUNTER — Ambulatory Visit (INDEPENDENT_AMBULATORY_CARE_PROVIDER_SITE_OTHER): Payer: Medicare HMO | Admitting: Internal Medicine

## 2013-08-28 ENCOUNTER — Encounter: Payer: Self-pay | Admitting: Internal Medicine

## 2013-08-28 VITALS — BP 145/71 | HR 65 | Temp 97.6°F | Wt 167.2 lb

## 2013-08-28 DIAGNOSIS — I10 Essential (primary) hypertension: Secondary | ICD-10-CM

## 2013-08-28 DIAGNOSIS — N3000 Acute cystitis without hematuria: Secondary | ICD-10-CM

## 2013-08-28 NOTE — Assessment & Plan Note (Signed)
BP Readings from Last 3 Encounters:  08/28/13 145/71  08/07/13 140/68  07/31/13 164/70    Lab Results  Component Value Date   NA 140 07/31/2013   K 4.6 07/31/2013   CREATININE 2.63* 07/31/2013    Assessment: Blood pressure is slightly above target on on amlodipine 10 mg daily, carvedilol 25 mg twice a day, and furosemide 40 mg daily.  Patient has been on lisinopril in the past, but the chart documents an intolerance of ACE inhibitors, and it is not clear upon chart review what the nature of that intolerance was.  Given his renal insufficiency, I would like to get input from his nephrologist Dr. Lowell Bauer regarding this.  Referral back to Dr. Lowell Bauer for followup has been improved by patient's insurance.  Plan: Continue current medications; refer back to his nephrologist Dr. Lowell Bauer and consider careful institution of an ACE inhibitor depending upon his assessment.

## 2013-08-28 NOTE — Patient Instructions (Signed)
Continue current medications. 

## 2013-08-28 NOTE — Assessment & Plan Note (Addendum)
Assessment: Patient is doing well following treatment with oral levofloxacin.[name].  He has no fever, chills, abdominal pain, nausea, vomiting, or dysuria.  He does have some chronic urinary frequency which he reports has been present for years and which is at baseline.  Plan: Check a urinalysis and urine culture to document clearance of infection.

## 2013-08-28 NOTE — Progress Notes (Signed)
   Subjective:    Patient ID: Ross Bauer, male    DOB: 02/16/1945, 68 y.o.   MRN: [id number]  HPI Patient returns for followup of a urinary tract infection recently treated with oral levofloxacin, and for followup of his hypertension.  He has no acute complaints today.  He completed a course of oral levofloxacin without problems.  He reports no significant change in his frequency of urination, but on further questioning says that this is a long-standing problem and has not changed recently.  He denies any dysuria, urinary difficulty, fever, chills, abdominal pain, back pain, or other problems.  He reports that he has been compliant with his medications, and he brought all of medicines to clinic.   Review of Systems  Constitutional: Negative for fever, chills and diaphoresis.  Respiratory: Negative for shortness of breath.   Cardiovascular: Negative for chest pain and leg swelling.  Gastrointestinal: Negative for nausea, vomiting and abdominal pain.  Genitourinary: Negative for dysuria, urgency, hematuria and difficulty urinating.       Objective:   Physical Exam  Constitutional: No distress.  Cardiovascular: Normal rate, regular rhythm and normal heart sounds.  Exam reveals no gallop and no friction rub.   No murmur heard. Pulmonary/Chest: Effort normal and breath sounds normal. No respiratory distress. He has no wheezes. He has no rales.  Abdominal: Soft. Bowel sounds are normal. He exhibits no distension. There is no hepatosplenomegaly. There is no tenderness. There is no rebound and no guarding.  Musculoskeletal: He exhibits no edema.        Assessment & Plan:

## 2013-08-29 LAB — URINALYSIS, ROUTINE W REFLEX MICROSCOPIC
Bilirubin Urine: NEGATIVE
GLUCOSE, UA: NEGATIVE mg/dL
Hgb urine dipstick: NEGATIVE
KETONES UR: NEGATIVE mg/dL
Nitrite: NEGATIVE
Protein, ur: NEGATIVE mg/dL
SPECIFIC GRAVITY, URINE: 1.012 (ref 1.005–1.030)
Urobilinogen, UA: 0.2 mg/dL (ref 0.0–1.0)
pH: 5 (ref 5.0–8.0)

## 2013-08-29 LAB — URINALYSIS, MICROSCOPIC ONLY
Bacteria, UA: NONE SEEN
CASTS: NONE SEEN
CRYSTALS: NONE SEEN
SQUAMOUS EPITHELIAL / LPF: NONE SEEN

## 2013-09-01 LAB — URINE CULTURE: Colony Count: 25000

## 2013-09-06 NOTE — Progress Notes (Signed)
Quick Note:  Given absence of symptoms, plan is to follow clinically and not treat with antibiotics. ______

## 2013-09-17 LAB — BASIC METABOLIC PANEL: Creatinine: 2.9 mg/dL — AB (ref 0.6–1.3)

## 2013-09-17 LAB — CBC AND DIFFERENTIAL: HEMOGLOBIN: 11.7 g/dL — AB (ref 13.5–17.5)

## 2013-10-28 IMAGING — US US RENAL
1 series · 14 of 25 positions shown · non-contrast
Comparison: None.

CLINICAL DATA: Progressive renal failure

RENAL/URINARY TRACT ULTRASOUND COMPLETE

[Series 1: us renal · 0.30mm/px · 14 of 30 slices shown]
[im 1/30]
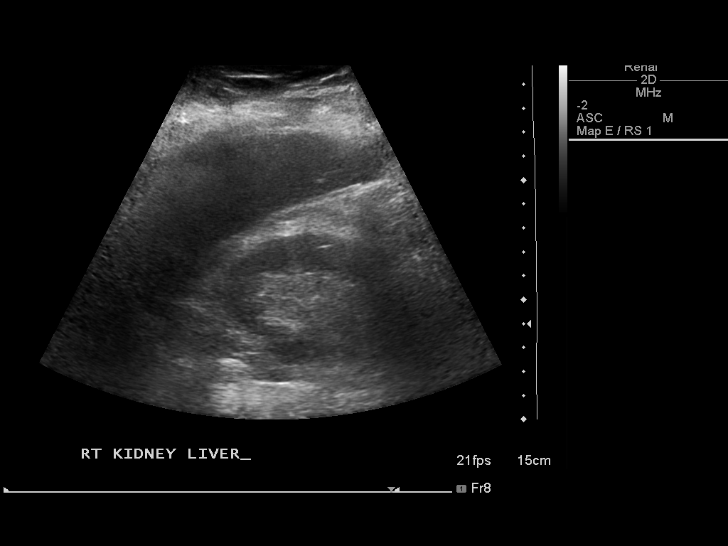
[im 3/30]
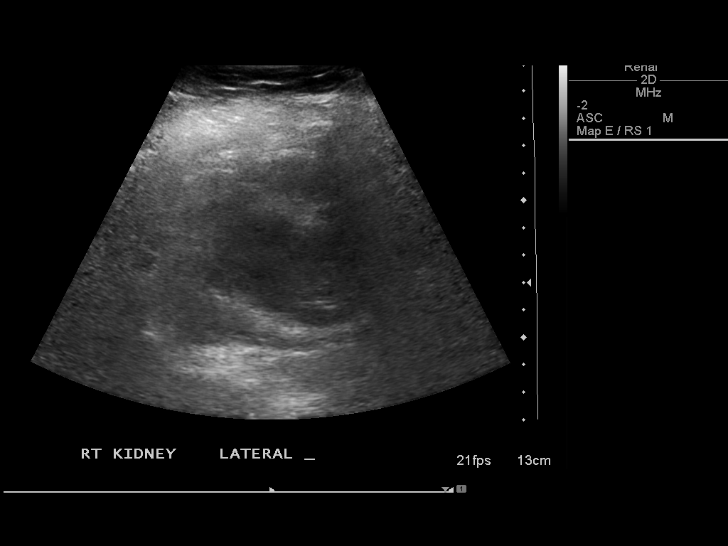
[im 5/30]
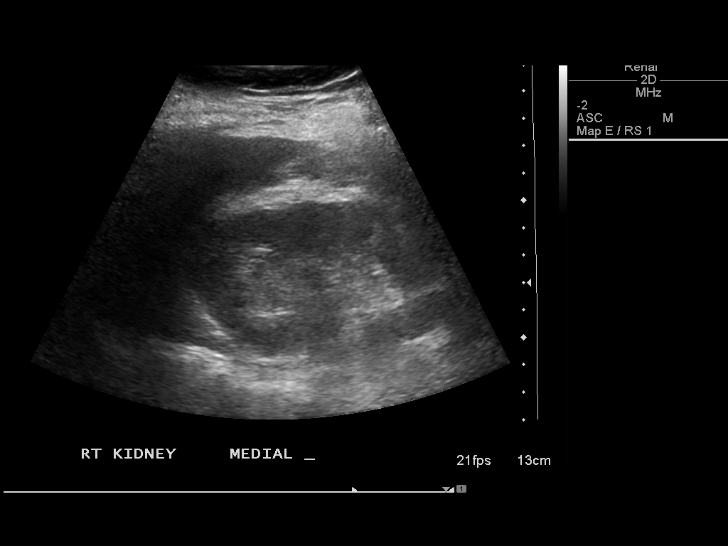
[im 8/30]
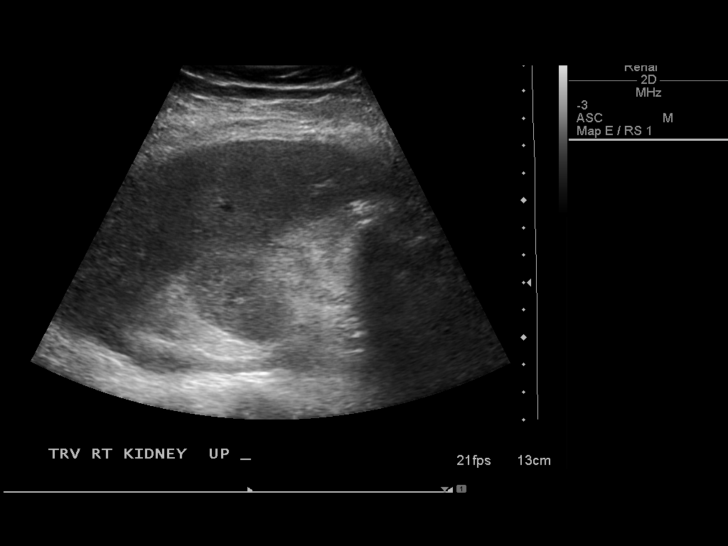
[im 10/30]
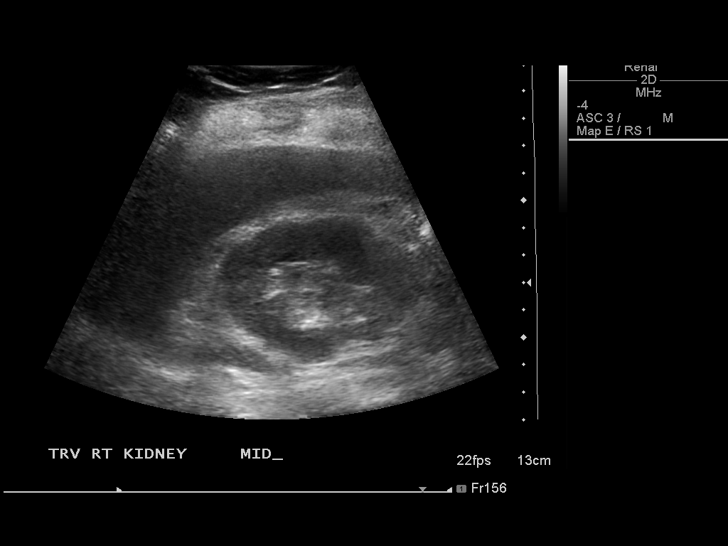
[im 11/30]
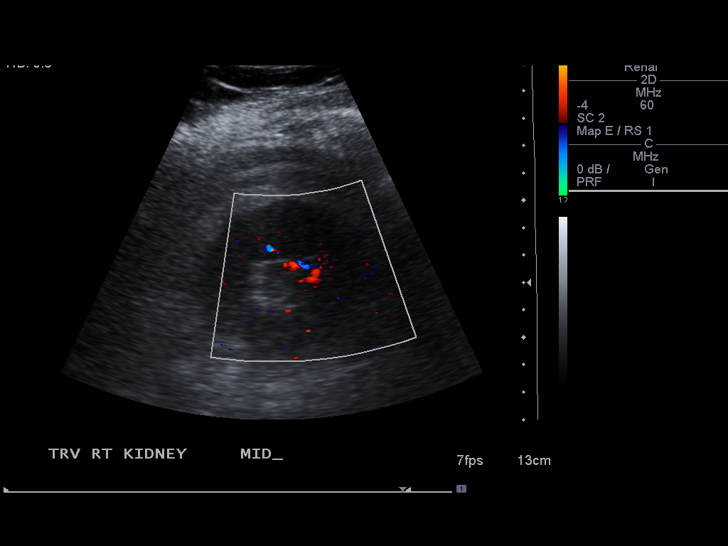
[im 14/30]
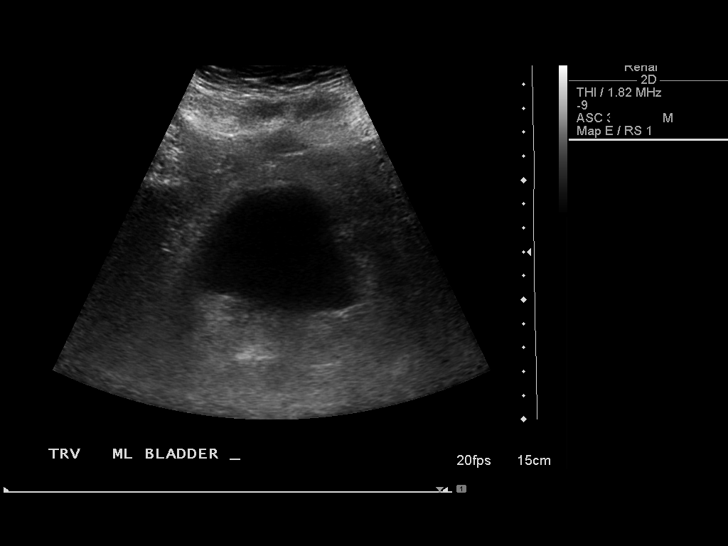
[im 16/30]
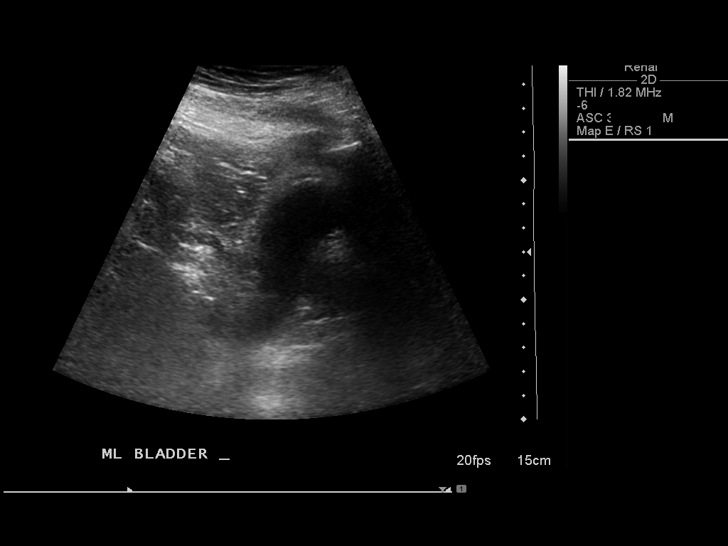
[im 19/30]
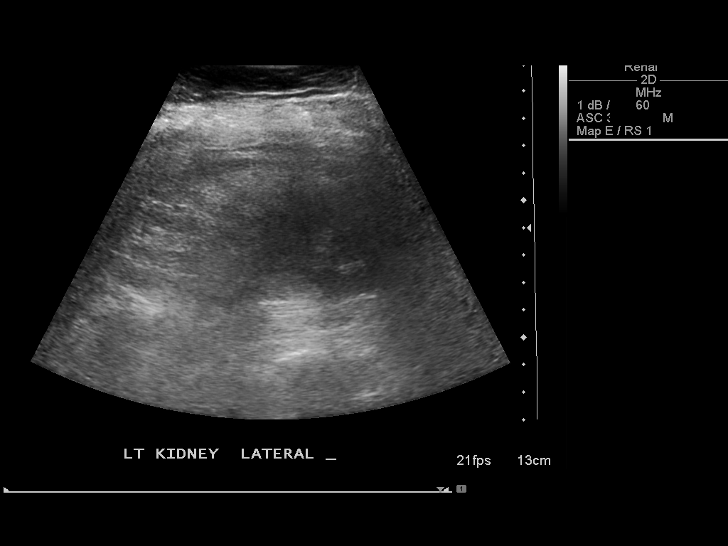
[im 20/30]
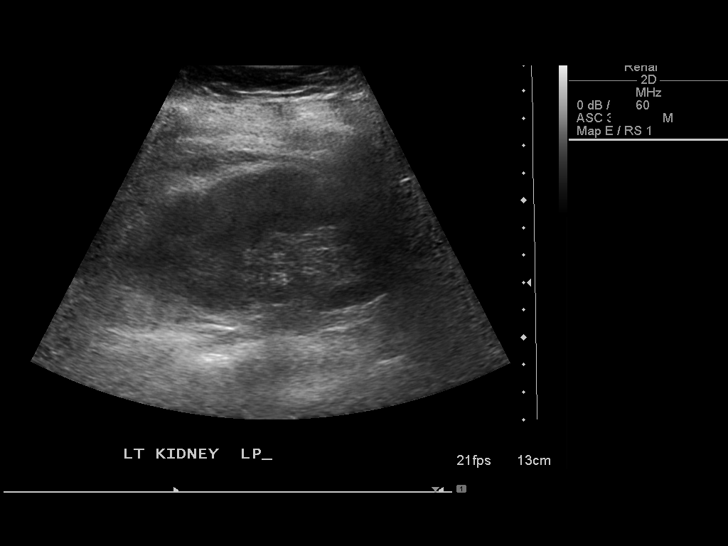
[im 22/30]
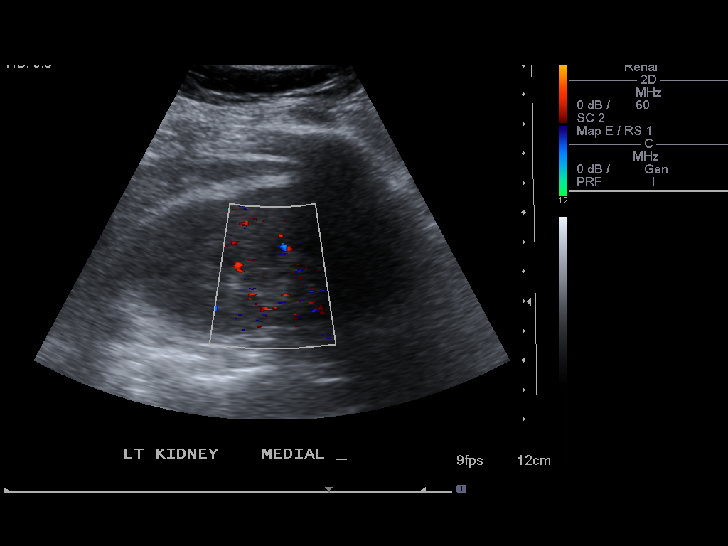
[im 25/30]
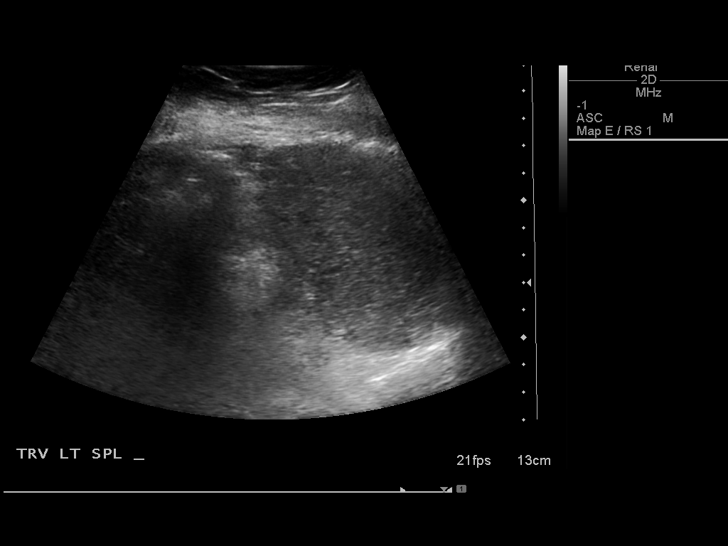
[im 27/30]
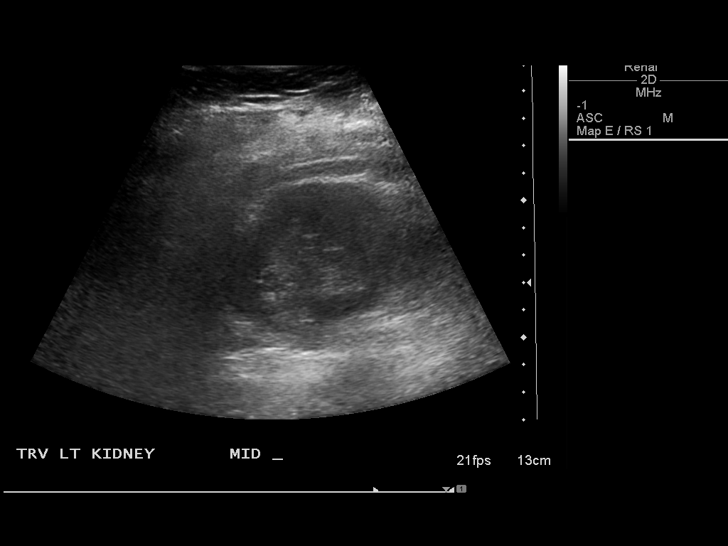
[im 30/30]
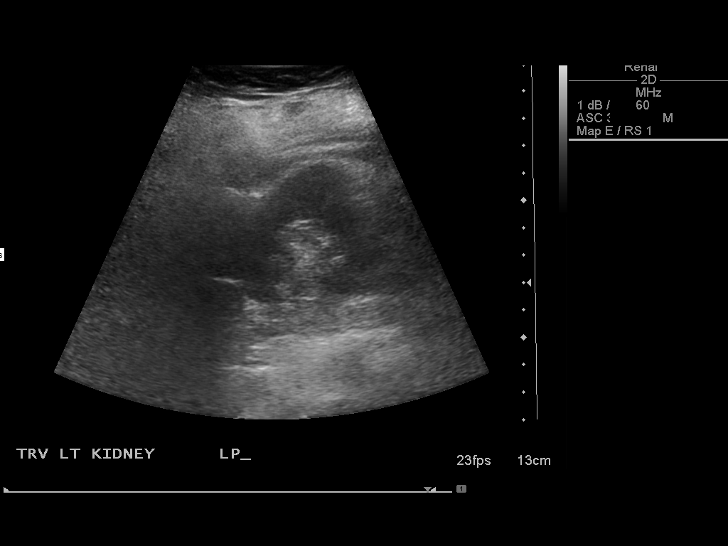

[14 of 25 positions shown; findings below may reference images not displayed]

FINDINGS: Right Kidney:  Measures 10.7 cm.  Normal in size and parenchymal
echogenicity.  No evidence of mass or hydronephrosis.

Left Kidney:  Measures 11 cm.  Normal in size and parenchymal
echogenicity.  No evidence of mass or hydronephrosis.

Bladder:  Appears normal for degree of bladder distention.
IMPRESSION: Normal study.

## 2013-11-13 ENCOUNTER — Ambulatory Visit (INDEPENDENT_AMBULATORY_CARE_PROVIDER_SITE_OTHER): Payer: Medicare HMO | Admitting: Internal Medicine

## 2013-11-13 ENCOUNTER — Encounter: Payer: Self-pay | Admitting: Internal Medicine

## 2013-11-13 VITALS — BP 150/63 | HR 62 | Temp 97.8°F | Ht 65.0 in | Wt 167.7 lb

## 2013-11-13 DIAGNOSIS — N184 Chronic kidney disease, stage 4 (severe): Secondary | ICD-10-CM

## 2013-11-13 DIAGNOSIS — E11319 Type 2 diabetes mellitus with unspecified diabetic retinopathy without macular edema: Secondary | ICD-10-CM

## 2013-11-13 DIAGNOSIS — E119 Type 2 diabetes mellitus without complications: Secondary | ICD-10-CM

## 2013-11-13 DIAGNOSIS — F17200 Nicotine dependence, unspecified, uncomplicated: Secondary | ICD-10-CM

## 2013-11-13 DIAGNOSIS — I10 Essential (primary) hypertension: Secondary | ICD-10-CM

## 2013-11-13 DIAGNOSIS — E113299 Type 2 diabetes mellitus with mild nonproliferative diabetic retinopathy without macular edema, unspecified eye: Secondary | ICD-10-CM

## 2013-11-13 DIAGNOSIS — Z720 Tobacco use: Secondary | ICD-10-CM

## 2013-11-13 HISTORY — DX: Type 2 diabetes mellitus with mild nonproliferative diabetic retinopathy without macular edema, unspecified eye: E11.3299

## 2013-11-13 LAB — GLUCOSE, CAPILLARY: Glucose-Capillary: 265 mg/dL — ABNORMAL HIGH (ref 70–99)

## 2013-11-13 LAB — BASIC METABOLIC PANEL WITH GFR
BUN: 41 mg/dL — ABNORMAL HIGH (ref 6–23)
CO2: 23 mEq/L (ref 19–32)
CREATININE: 2.92 mg/dL — AB (ref 0.50–1.35)
Calcium: 8.8 mg/dL (ref 8.4–10.5)
Chloride: 104 mEq/L (ref 96–112)
GFR, Est African American: 24 mL/min — ABNORMAL LOW
GFR, Est Non African American: 21 mL/min — ABNORMAL LOW
Glucose, Bld: 190 mg/dL — ABNORMAL HIGH (ref 70–99)
Potassium: 4.4 mEq/L (ref 3.5–5.3)
Sodium: 136 mEq/L (ref 135–145)

## 2013-11-13 LAB — POCT GLYCOSYLATED HEMOGLOBIN (HGB A1C): Hemoglobin A1C: 6.8

## 2013-11-13 MED ORDER — NICOTINE 21 MG/24HR TD PT24: 21.0000 mg | MEDICATED_PATCH | TRANSDERMAL | Status: DC

## 2013-11-13 MED ORDER — CLONIDINE HCL 0.1 MG PO TABS: 0.1000 mg | ORAL_TABLET | Freq: Two times a day (BID) | ORAL | Status: DC

## 2013-11-13 NOTE — Assessment & Plan Note (Addendum)
  Assessment: Progress toward smoking cessation:  smoking the same amount Comments: today patient stated that he would like to quit smoking, and he would like to try nicotine patches to assist with smoking cessation.  He is currently smoking about one pack per day.  Plan: Instruction/counseling given:   I counseled patient on the dangers of tobacco use, advised patient to stop smoking, and reviewed strategies to maximize success. Medications to assist with smoking cessation:   Nicotine Patch starting with 21 mg patch daily for 6 weeks, and then tapering to 14 mg patch daily for 2 weeks, then to a 7 mg patch daily for 2 weeks, then stop.

## 2013-11-13 NOTE — Assessment & Plan Note (Addendum)
Lab Results  Component Value Date   CREATININE 2.9* 09/17/2013   CREATININE 2.63* 07/31/2013   CREATININE 2.83* 04/30/2013   CREATININE 3.09* 04/26/2012    Lab Results  Component Value Date   CREATININE 2.9* 09/17/2013   CREATININE 2.63* 07/31/2013   CREATININE 2.83* 04/30/2013     Assessment: Patient has chronic kidney disease attributed by his nephrologist Dr. Lowell GuitarPowell to long-standing hypertensive nephrosclerosis with possible contribution of diabetic nephropathy.   He saw Dr. Lowell GuitarPowell on September 8; his renal function is stable.  Plan: Patient will follow up with Dr. Lowell GuitarPowell as scheduled.  I discussed the management of patient's hypertension with Dr. Lowell GuitarPowell by telephone today, and added clonidine 0.1 mg twice a day to his antihypertensive regimen.  Will check a basic metabolic panel today.

## 2013-11-13 NOTE — Patient Instructions (Addendum)
Start clonidine 0.1 mg 1 tablet twice a day; this is for your blood pressure. Use nicotine 21 mg patch one daily to assist with smoking cessation as directed for 6 weeks; you will then decrease to a 14 mg patch for 2 weeks, then a 7 mg patch for 2 weeks, then stop. Stop smoking; do not smoke while wearing nicotine patch. Please schedule an appointment with your ophthalmologist Dr. Dione BoozeGroat for eye exam.

## 2013-11-13 NOTE — Assessment & Plan Note (Signed)
Lab Results  Component Value Date   HGBA1C 6.8 11/13/2013   HGBA1C 7.2 07/31/2013   HGBA1C 6.9 04/30/2013     Assessment: Diabetes control: good control (HgbA1C at goal) Progress toward A1C goal:  at goal Comments: hemoglobin A1c is at goal on glipizide 5 mg daily.  Patient was referred to his ophthalmologist for eye exam but has not seen his ophthalmologist.  Plan: Medications:  continue current medications Home glucose monitoring: Frequency: once a day Timing: before breakfast Instruction/counseling given: reminded to get eye exam and reminded to bring blood glucose meter & log to each visit

## 2013-11-13 NOTE — Assessment & Plan Note (Signed)
Assessment: Patient is followed by ophthalmologist Dr. Ernesto Rutherfordobert Groat.  I referred him to Dr. Dione BoozeGroat for an eye exam earlier this year, but patient reports that he has not yet seen Dr. Dione BoozeGroat in follow-up.  Plan: I discussed the importance of having regular eye exams, and patient agreed to schedule an appointment with Dr. Dione BoozeGroat.

## 2013-11-13 NOTE — Assessment & Plan Note (Signed)
BP Readings from Last 3 Encounters:  11/13/13 150/63  08/28/13 145/71  08/07/13 140/68    Lab Results  Component Value Date   NA 140 07/31/2013   K 4.6 07/31/2013   CREATININE 2.63* 07/31/2013    Assessment: Blood pressure control: moderately elevated Progress toward BP goal:  deteriorated Comments: patient is currently on amlodipine 10 mg daily, carvedilol 25 mg twice a day, and furosemide 40 mg daily.  His blood pressure on initial check today was moderately elevated, and repeat blood pressure showed a systolic pressure of 170.  I called patient's nephrologist Dr. Lowell GuitarPowell today and discussed blood pressure management with him.  Since patient has not been on an ACE inhibitor recently, and given his past intolerance of an ACE inhibitor and his degree of chronic kidney disease, Dr. Lowell GuitarPowell advised against starting an ACE inhibitor at this time.  He did agree with adding clonidine 0.1 mg twice a day for better blood pressure control.  Plan: Medications:  continue amlodipine 10 mg daily, carvedilol 25 mg twice a day, and furosemide 40 mg daily; start clonidine 0.1 mg twice a day.

## 2013-11-13 NOTE — Progress Notes (Signed)
   Subjective:    Patient ID: Ross Bauer, male    DOB: 10/27/1945, 68 y.o.   MRN: [id number]  HPI Patient presents for management of his diabetes mellitus, hypertension, chronic kidney disease, tobacco abuse, and other chronic medical problems.  He has no acute complaints today, and reports that he has been feeling well.  He reports that he is compliant with his medications.  He has not been checking his blood sugar at home recently.  He has seen his nephrologist Dr. Lowell GuitarPowell since his last visit here, but has not yet seen his ophthalmologist Dr. Dione BoozeGroat.  He smokes about 1 pack per day, and is interested in nicotine patches to assist with smoking cessation.  He declined referral for colonoscopy and he declined all recommended immunizations again today.   Review of Systems  Constitutional: Negative for fever, chills, diaphoresis, appetite change and unexpected weight change.  Eyes: Negative for visual disturbance.  Respiratory: Negative for cough, shortness of breath and wheezing.   Cardiovascular: Negative for chest pain and leg swelling.  Gastrointestinal: Negative for nausea, vomiting, abdominal pain, blood in stool and anal bleeding.  Endocrine: Negative for polydipsia and polyuria.  Genitourinary: Negative for dysuria and frequency.  Musculoskeletal: Negative for back pain and arthralgias.  Skin: Negative for rash.  Neurological: Negative for dizziness, weakness and numbness.    I reviewed and updated the medications, allergies, past medical history, past surgical history, family history, and social history.      Objective:   Physical Exam  Constitutional: No distress.  Cardiovascular: Normal rate, regular rhythm and normal heart sounds.  Exam reveals no gallop and no friction rub.   No murmur heard. Pulmonary/Chest: Effort normal and breath sounds normal. No respiratory distress. He has no wheezes. He has no rales.  Abdominal: Soft. Bowel sounds are normal. He exhibits no  distension. There is no hepatosplenomegaly. There is no tenderness. There is no rebound and no guarding.  Musculoskeletal: He exhibits no edema.       Assessment & Plan:

## 2013-12-13 ENCOUNTER — Encounter: Payer: Self-pay | Admitting: Internal Medicine

## 2013-12-13 ENCOUNTER — Ambulatory Visit (INDEPENDENT_AMBULATORY_CARE_PROVIDER_SITE_OTHER): Payer: Medicare HMO | Admitting: Internal Medicine

## 2013-12-13 VITALS — BP 133/67 | HR 57 | Temp 97.4°F | Ht 66.0 in | Wt 164.4 lb

## 2013-12-13 DIAGNOSIS — E113299 Type 2 diabetes mellitus with mild nonproliferative diabetic retinopathy without macular edema, unspecified eye: Secondary | ICD-10-CM

## 2013-12-13 DIAGNOSIS — N184 Chronic kidney disease, stage 4 (severe): Secondary | ICD-10-CM

## 2013-12-13 DIAGNOSIS — I10 Essential (primary) hypertension: Secondary | ICD-10-CM

## 2013-12-13 DIAGNOSIS — E11319 Type 2 diabetes mellitus with unspecified diabetic retinopathy without macular edema: Secondary | ICD-10-CM

## 2013-12-13 LAB — GLUCOSE, CAPILLARY: Glucose-Capillary: 250 mg/dL — ABNORMAL HIGH (ref 70–99)

## 2013-12-13 NOTE — Assessment & Plan Note (Signed)
BP Readings from Last 3 Encounters:  12/13/13 133/67  11/13/13 150/63  08/28/13 145/71    Lab Results  Component Value Date   NA 136 11/13/2013   K 4.4 11/13/2013   CREATININE 2.92* 11/13/2013    Assessment: Blood pressure control: controlled Progress toward BP goal:  improved Comments: Previously taking Amlodipine 10 mg daily + Carvedilol 25 mg bid + Lasix 40 mg daily. Started on Clonidine 0.1 mg bid at last clinic visit. BP well controlled today, no adverse effects associated w/ new medication.   Plan: Medications:  continue current medications Educational resources provided: brochure (has information) Self management tools provided:   Other plans: Patient to return to clinic in 4-6 weeks for further follow up. To see Dr. Lowell GuitarPowell in 1-2 weeks.

## 2013-12-13 NOTE — Progress Notes (Signed)
Subjective:   Patient ID: Ross Bauer male   DOB: 01/03/1946 68 y.o.   MRN: [id number]  HPI: Ross Bauer is a 68 y.o. male w/ PMHx of DM type II, HTN, CKD stage IV, HLD, and normocytic anemia, presents to the clinic today for a follow-up visit regarding his blood pressure. [facility] by Dr. Meredith PelJoines on 11/13/13 at which time his blood pressure was elevated, SBP in the 170's. At that time he was started on Clonidine 0.1 mg bid for better control. Today Ross Bauer has no complaints. States he has been very compliant with his medications and has had no clear adverse effects to Clonidine. Denies fatigue, headaches, palpitations, dizziness, or lightheadedness. BP well controlled today.  Patient also states that he is scheduled to follow up w/ Dr. Lowell GuitarPowell regarding his CKD in 1-2 weeks and made an appointment to see Dr. Dione BoozeGroat in 1-2 weeks as well.  The patient continues to smoke cigarettes and has not started using the nicotine patches at this time.   Past Medical History  Diagnosis Date  . 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 Hypertension   . CKD (chronic kidney disease)     Stage 4  . ED (erectile dysfunction)   . Hernia   . Bilateral leg edema   . Hyperlipidemia   . Normocytic anemia     Hg baseline 11-12.   Current Outpatient Prescriptions  Medication Sig Dispense Refill  . amLODipine (NORVASC) 10 MG tablet Take 1 tablet (10 mg total) by mouth daily. 90 tablet 3  . aspirin 81 MG tablet Take 1 tablet (81 mg total) by mouth daily. 90 tablet 3  . atorvastatin (LIPITOR) 40 MG tablet Take 1 tablet (40 mg total) by mouth daily. 90 tablet 1  . carvedilol (COREG) 25 MG tablet Take 1 tablet (25 mg total) by mouth 2 (two) times daily with a meal. 180 tablet 3  . cloNIDine (CATAPRES) 0.1 MG tablet Take 1 tablet (0.1 mg total) by mouth 2 (two) times daily. 60 tablet 3  . furosemide (LASIX) 40 MG tablet Take 1 tablet (40 mg total) by mouth daily. 90  tablet 1  . glipiZIDE (GLUCOTROL) 5 MG tablet Take 1 tablet (5 mg total) by mouth daily with breakfast. 90 tablet 1  . glucose blood (ONE TOUCH ULTRA TEST) test strip Use to check blood sugars once daily. Dx code 250.00.Non-insulin dependent. 100 each 0  . Lancets 30G MISC Use to check blood sugar 1-2 times daily.     . nicotine (NICODERM CQ - DOSED IN MG/24 HOURS) 21 mg/24hr patch Place 1 patch (21 mg total) onto the skin daily. 30 patch 1  . [DISCONTINUED] rosuvastatin (CRESTOR) 40 MG tablet Take 1 tablet (40 mg total) by mouth daily. 30 tablet 5   No current facility-administered medications for this visit.    Review of Systems: General: Denies fever, chills, diaphoresis, appetite change and fatigue.  Respiratory: Denies SOB, DOE, cough, and wheezing.   Cardiovascular: Denies chest pain and palpitations.  Gastrointestinal: Denies nausea, vomiting, abdominal pain, and diarrhea.  Genitourinary: Denies dysuria, increased frequency, and flank pain. Endocrine: Denies hot or cold intolerance, polyuria, and polydipsia. Musculoskeletal: Denies myalgias, back pain, joint swelling, arthralgias and gait problem.  Skin: Denies pallor, rash and wounds.  Neurological: Denies dizziness, seizures, syncope, weakness, lightheadedness, numbness and headaches.  Psychiatric/Behavioral: Denies mood changes, and sleep disturbances.  Objective:   Physical Exam: Filed Vitals:  12/13/13 0934  BP: 133/67  Pulse: 57  Temp: 97.4 F (36.3 C)  TempSrc: Oral  Height: 5\' 6"  (1.676 m)  Weight: 164 lb 6.4 oz (74.571 kg)  SpO2: 99%    General: Elderly AA male, alert, cooperative, NAD. HEENT: PERRL, EOMI. Moist mucus membranes Neck: Full range of motion without pain, supple, no lymphadenopathy or carotid bruits Lungs: Clear to ascultation bilaterally, normal work of respiration, no wheezes, rales, rhonchi Heart: RRR, no murmurs, gallops, or rubs Abdomen: Soft, non-tender, non-distended, BS + Extremities:  No cyanosis, clubbing, or edema Neurologic: Alert & oriented X3, cranial nerves II-XII intact, strength grossly intact, sensation intact to light touch   Assessment & Plan:   Please see problem based assessment and plan.

## 2013-12-13 NOTE — Patient Instructions (Signed)
General Instructions:  1. Please schedule a follow up appointment for 4-6 weeks.    2. Please take all medications as prescribed. Continue Clonidine 0.1 mg twice daily in addition to the rest of your medications.  3. If you have worsening of your symptoms or new symptoms arise, please call the clinic [phone]), or go to the ER immediately if symptoms are severe.  You have done a great job in taking all your medications. I appreciate it very much. Please continue doing that.    Thank you for bringing your medicines today. This helps us keep you safe from mistakes.   Progress Toward Treatment Goals:  Treatment Goal 12/13/2013  Hemoglobin A1C unchanged  Blood pressure improved  Stop smoking smoking the same amount    Self Care Goals & Plans:  Self Care Goal 12/13/2013  Manage my medications take my medicines as prescribed; bring my medications to every visit; refill my medications on time  Monitor my health -  Eat healthy foods drink diet soda or water instead of juice or soda; eat more vegetables; eat foods that are low in salt; eat baked foods instead of fried foods; eat fruit for snacks and desserts  Be physically active -  Stop smoking -  Meeting treatment goals maintain the current self-care plan    Home Blood Glucose Monitoring 12/13/2013  Check my blood sugar no home glucose monitoring  When to check my blood sugar N/A     Care Management & Community Referrals:  Referral 12/13/2013  Referrals made for care management support none needed  Referrals made to community resources -

## 2013-12-13 NOTE — Assessment & Plan Note (Signed)
Most recent Cr at baseline.  -Patient to follow up w/ Dr. Lowell GuitarPowell in 1-2 weeks.

## 2013-12-13 NOTE — Assessment & Plan Note (Signed)
Patient states he has made an appointment with Dr. Dione BoozeGroat in the next 1-2 weeks.  -Encouraged him to make this appointment.  -[facility] visit w/ Dr. Dione BoozeGroat.

## 2013-12-16 NOTE — Progress Notes (Signed)
INTERNAL MEDICINE TEACHING ATTENDING ADDENDUM - Estalene Bergey, MD: I reviewed and discussed at the time of visit with the resident Dr. Jones, the patient's medical history, physical examination, diagnosis and results of pertinent tests and treatment and I agree with the patient's care as documented.  

## 2014-01-21 ENCOUNTER — Other Ambulatory Visit: Payer: Self-pay | Admitting: Internal Medicine

## 2014-01-22 DIAGNOSIS — I10 Essential (primary) hypertension: Secondary | ICD-10-CM | POA: Diagnosis not present

## 2014-01-22 DIAGNOSIS — N184 Chronic kidney disease, stage 4 (severe): Secondary | ICD-10-CM | POA: Diagnosis not present

## 2014-02-11 ENCOUNTER — Telehealth: Payer: Self-pay | Admitting: Internal Medicine

## 2014-02-11 NOTE — Telephone Encounter (Signed)
Call to patient to confirm appointment for 02/12/14 at 9:15. Patient Confirmed.

## 2014-02-12 ENCOUNTER — Encounter: Payer: Self-pay | Admitting: Internal Medicine

## 2014-02-12 ENCOUNTER — Ambulatory Visit (INDEPENDENT_AMBULATORY_CARE_PROVIDER_SITE_OTHER): Payer: Commercial Managed Care - HMO | Admitting: Internal Medicine

## 2014-02-12 VITALS — BP 133/66 | HR 67 | Temp 97.7°F | Wt 163.6 lb

## 2014-02-12 DIAGNOSIS — N184 Chronic kidney disease, stage 4 (severe): Secondary | ICD-10-CM | POA: Diagnosis not present

## 2014-02-12 DIAGNOSIS — E785 Hyperlipidemia, unspecified: Secondary | ICD-10-CM

## 2014-02-12 DIAGNOSIS — E1165 Type 2 diabetes mellitus with hyperglycemia: Secondary | ICD-10-CM

## 2014-02-12 DIAGNOSIS — I10 Essential (primary) hypertension: Secondary | ICD-10-CM | POA: Diagnosis not present

## 2014-02-12 DIAGNOSIS — Z720 Tobacco use: Secondary | ICD-10-CM | POA: Diagnosis not present

## 2014-02-12 DIAGNOSIS — K148 Other diseases of tongue: Secondary | ICD-10-CM | POA: Diagnosis not present

## 2014-02-12 DIAGNOSIS — F17200 Nicotine dependence, unspecified, uncomplicated: Secondary | ICD-10-CM

## 2014-02-12 DIAGNOSIS — Z0000 Encounter for general adult medical examination without abnormal findings: Secondary | ICD-10-CM

## 2014-02-12 DIAGNOSIS — IMO0002 Reserved for concepts with insufficient information to code with codable children: Secondary | ICD-10-CM

## 2014-02-12 LAB — COMPLETE METABOLIC PANEL WITH GFR
ALT: 9 U/L (ref 0–53)
AST: 9 U/L (ref 0–37)
Albumin: 3.3 g/dL — ABNORMAL LOW (ref 3.5–5.2)
Alkaline Phosphatase: 103 U/L (ref 39–117)
BILIRUBIN TOTAL: 0.5 mg/dL (ref 0.2–1.2)
BUN: 40 mg/dL — ABNORMAL HIGH (ref 6–23)
CALCIUM: 8.9 mg/dL (ref 8.4–10.5)
CO2: 23 mEq/L (ref 19–32)
CREATININE: 2.88 mg/dL — AB (ref 0.50–1.35)
Chloride: 102 mEq/L (ref 96–112)
GFR, Est African American: 24 mL/min — ABNORMAL LOW
GFR, Est Non African American: 21 mL/min — ABNORMAL LOW
GLUCOSE: 298 mg/dL — AB (ref 70–99)
Potassium: 3.7 mEq/L (ref 3.5–5.3)
Sodium: 133 mEq/L — ABNORMAL LOW (ref 135–145)
Total Protein: 6.7 g/dL (ref 6.0–8.3)

## 2014-02-12 LAB — CBC WITH DIFFERENTIAL/PLATELET
BASOS PCT: 1 % (ref 0–1)
Basophils Absolute: 0.1 10*3/uL (ref 0.0–0.1)
EOS ABS: 0.3 10*3/uL (ref 0.0–0.7)
Eosinophils Relative: 4 % (ref 0–5)
HCT: 35 % — ABNORMAL LOW (ref 39.0–52.0)
HEMOGLOBIN: 11.5 g/dL — AB (ref 13.0–17.0)
Lymphocytes Relative: 25 % (ref 12–46)
Lymphs Abs: 1.6 10*3/uL (ref 0.7–4.0)
MCH: 29.7 pg (ref 26.0–34.0)
MCHC: 32.9 g/dL (ref 30.0–36.0)
MCV: 90.4 fL (ref 78.0–100.0)
MONOS PCT: 6 % (ref 3–12)
MPV: 10.6 fL (ref 8.6–12.4)
Monocytes Absolute: 0.4 10*3/uL (ref 0.1–1.0)
NEUTROS PCT: 64 % (ref 43–77)
Neutro Abs: 4.2 10*3/uL (ref 1.7–7.7)
PLATELETS: 224 10*3/uL (ref 150–400)
RBC: 3.87 MIL/uL — ABNORMAL LOW (ref 4.22–5.81)
RDW: 13.6 % (ref 11.5–15.5)
WBC: 6.5 10*3/uL (ref 4.0–10.5)

## 2014-02-12 LAB — LIPID PANEL
CHOL/HDL RATIO: 6.9 ratio
Cholesterol: 166 mg/dL (ref 0–200)
HDL: 24 mg/dL — ABNORMAL LOW (ref >39)
LDL Cholesterol: 117 mg/dL — ABNORMAL HIGH (ref 0–99)
Triglycerides: 127 mg/dL (ref <150)
VLDL: 25 mg/dL (ref 0–40)

## 2014-02-12 LAB — POCT GLYCOSYLATED HEMOGLOBIN (HGB A1C): Hemoglobin A1C: 8.3

## 2014-02-12 LAB — GLUCOSE, CAPILLARY: GLUCOSE-CAPILLARY: 327 mg/dL — AB (ref 70–99)

## 2014-02-12 MED ORDER — GLIPIZIDE 5 MG PO TABS: ORAL_TABLET | ORAL | Status: DC

## 2014-02-12 MED ORDER — GLIPIZIDE 5 MG PO TABS: 5.0000 mg | ORAL_TABLET | Freq: Two times a day (BID) | ORAL | Status: DC

## 2014-02-12 NOTE — Progress Notes (Signed)
   Subjective:    Patient ID: Ross ManifoldJulius L Aydelotte, male    DOB: 12/07/1945, 69 y.o.   MRN: [id number]  HPI Patient returns for management of his diabetes mellitus, hypertension, hyperlipidemia, and other chronic medical problems.  He has no acute complaints today.  He reports that he recently has been drinking more sweetened beverages, and feels that his elevated blood sugar today is a result of that.  He reports that he has not been measuring his blood glucose at home, although he does have a meter.  He reports that he has been compliant with his medications.   Review of Systems  Constitutional: Negative for fever, chills and diaphoresis.  HENT: Negative for mouth sores and sore throat.   Respiratory: Negative for shortness of breath.   Cardiovascular: Negative for chest pain and leg swelling.  Gastrointestinal: Negative for nausea, vomiting, abdominal pain and blood in stool.  Genitourinary: Negative for dysuria and difficulty urinating.       Objective:   Physical Exam  Constitutional: No distress.  HENT:  Mouth/Throat: Oropharynx is clear and moist. No oral lesions. No oropharyngeal exudate.  Cardiovascular: Normal rate, regular rhythm and normal heart sounds.  Exam reveals no gallop and no friction rub.   No murmur heard. No lower extremity edema  Pulmonary/Chest: Effort normal and breath sounds normal. No respiratory distress. He has no wheezes. He has no rales.  Abdominal: Soft. Bowel sounds are normal. He exhibits no distension. There is no hepatosplenomegaly. There is no tenderness. There is no rebound and no guarding.        Assessment & Plan:

## 2014-02-12 NOTE — Assessment & Plan Note (Addendum)
Lab Results  Component Value Date   HGBA1C 8.3 02/12/2014   HGBA1C 6.8 11/13/2013   HGBA1C 7.2 07/31/2013     Assessment: Diabetes control: fair control Progress toward A1C goal:  deteriorated Comments: Patient's hemoglobin A1c has increased from 6.8 to 8.3 since his last visit.  He attributes this to dietary nonadherence, and reports that he has been drinking a lot of sugared drinks lately  Plan: Medications:  Increase glipizide to a dose of 7.5 mg daily; I am doing this cautiously given his chronic kidney disease. Home glucose monitoring: Frequency: once a day Timing: before breakfast Instruction/counseling given: reminded to bring blood glucose meter & log to each visit, reminded to bring medications to each visit and discussed diet Educational resources provided: brochure Self management tools provided: home glucose logbook Other plans: I advised referral to diabetes educator, but patient declined today.  He reports that he was recently seen by his ophthalmologist Dr. Dione BoozeGroat; will request a copy of that office visit note.

## 2014-02-12 NOTE — Assessment & Plan Note (Signed)
  Assessment: Progress toward smoking cessation:  smoking the same amount Barriers to progress toward smoking cessation:  lack of motivation to quit Comments: Patient says he simply isn't ready to quit at this time  Plan: Instruction/counseling given:  I counseled patient on the dangers of tobacco use, advised patient to stop smoking, and reviewed strategies to maximize success. Self management tools provided:  smoking cessation plan ([employer] Quit Plan) Medications to assist with smoking cessation:  Declined by patient Patient agreed to the following self-care plans for smoking cessation: cut down the number of cigarettes smoked, go to the [employer] website ([email or web address]ww[employer]quitlinenc.com)

## 2014-02-12 NOTE — Assessment & Plan Note (Signed)
BP Readings from Last 3 Encounters:  02/12/14 133/66  12/13/13 133/67  11/13/13 150/63    Lab Results  Component Value Date   NA 133* 02/12/2014   K 3.7 02/12/2014   CREATININE 2.88* 02/12/2014    Assessment: Blood pressure control: controlled Progress toward BP goal:  at goal Comments: Blood pressure is at goal on amlodipine 10 mg daily, carvedilol 25 mg twice a day, clonidine 0.1 mg twice a day, and furosemide 40 mg daily.  Plan: Medications:  continue current medications Educational resources provided: brochure Self management tools provided: home blood pressure logbook

## 2014-02-12 NOTE — Assessment & Plan Note (Signed)
Assessment: Patient has chronic kidney disease attributed by his nephrologist Dr. Lowell GuitarPowell to long-standing hypertensive nephrosclerosis with possible contribution of diabetic nephropathy.     Plan: Check a CBC and comprehensive metabolic panel today.  Patient will follow-up with Dr. Lowell GuitarPowell as scheduled.

## 2014-02-12 NOTE — Assessment & Plan Note (Signed)
Assessment: Patient is doing well on atorvastatin 40 mg daily, with no apparent side effects.  Plan: Continue atorvastatin 40 mg daily; check a lipid panel today.

## 2014-02-12 NOTE — Patient Instructions (Addendum)
Increase glipizide 5 mg to a dose of 1 tablet before breakfast and one-half tablet before supper each day. Please check your blood sugar daily and call if you have any values less than 80 or greater than 300.

## 2014-02-12 NOTE — Assessment & Plan Note (Addendum)
I discussed with patient the advisability of colonoscopy, but he refused referral; I also discussed the option of fecal occult blood testing with a referral to GI if these return positive, but he declined FOB testing.  He refused all due immunizations.

## 2014-02-12 NOTE — Assessment & Plan Note (Signed)
Assessment: Patient was referred in 2004 to ENT for evaluation of a lesion on the right side of his tongue.  I inquired about this today, and he reports that he did not see the ENT physician, and that he has no sores or lesions in his mouth.  On exam today, I do not see any lesion.  Plan: I advised patient to let us know if he develops any oral symptoms.

## 2014-02-18 ENCOUNTER — Other Ambulatory Visit: Payer: Self-pay | Admitting: Internal Medicine

## 2014-02-27 ENCOUNTER — Telehealth: Payer: Self-pay | Admitting: Internal Medicine

## 2014-02-27 DIAGNOSIS — IMO0002 Reserved for concepts with insufficient information to code with codable children: Secondary | ICD-10-CM

## 2014-02-27 DIAGNOSIS — E118 Type 2 diabetes mellitus with unspecified complications: Secondary | ICD-10-CM

## 2014-02-27 DIAGNOSIS — E785 Hyperlipidemia, unspecified: Secondary | ICD-10-CM

## 2014-02-27 DIAGNOSIS — I10 Essential (primary) hypertension: Secondary | ICD-10-CM

## 2014-02-27 DIAGNOSIS — E1165 Type 2 diabetes mellitus with hyperglycemia: Secondary | ICD-10-CM

## 2014-02-27 MED ORDER — ATORVASTATIN CALCIUM 80 MG PO TABS: 80.0000 mg | ORAL_TABLET | Freq: Every day | ORAL | Status: DC

## 2014-02-27 NOTE — Assessment & Plan Note (Addendum)
Telephone Contact Note  Lipids:    Component Value Date/Time   CHOL 166 02/12/2014 1116   TRIG 127 02/12/2014 1116   HDL 24* 02/12/2014 1116   LDLCALC 117* 02/12/2014 1116   VLDL 25 02/12/2014 1116   CHOLHDL 6.9 02/12/2014 1116    LDL is above goal on atorvastatin 40 mg daily, which is a moderate intensity statin dose.  The plan is to increase atorvastatin to a high intensity dose of 80 mg daily.  I called patient and discussed this with him today.  I also discussed potential side effects of statin medications including muscle and/or liver injury, and I advised him to let me know if he has any symptoms suggesting either of those side effects.

## 2014-02-27 NOTE — Telephone Encounter (Signed)
Lipids:    Component Value Date/Time   CHOL 166 02/12/2014 1116   TRIG 127 02/12/2014 1116   HDL 24* 02/12/2014 1116   LDLCALC 117* 02/12/2014 1116   VLDL 25 02/12/2014 1116   CHOLHDL 6.9 02/12/2014 1116    LDL is above goal on atorvastatin 40 mg daily, which is a moderate intensity statin dose.  The plan is to increase atorvastatin to a high intensity dose of 80 mg daily.  I called patient and discussed this with him today.  I also discussed potential side effects of statin medications including muscle and/or liver injury, and I advised him to let me know if he has any symptoms suggesting either of those side effects.

## 2014-03-10 ENCOUNTER — Other Ambulatory Visit: Payer: Self-pay | Admitting: Internal Medicine

## 2014-03-26 ENCOUNTER — Ambulatory Visit (INDEPENDENT_AMBULATORY_CARE_PROVIDER_SITE_OTHER): Payer: Commercial Managed Care - HMO | Admitting: Internal Medicine

## 2014-03-26 ENCOUNTER — Encounter: Payer: Self-pay | Admitting: Internal Medicine

## 2014-03-26 VITALS — BP 126/54 | HR 63 | Temp 97.0°F | Wt 160.6 lb

## 2014-03-26 DIAGNOSIS — I10 Essential (primary) hypertension: Secondary | ICD-10-CM

## 2014-03-26 DIAGNOSIS — E785 Hyperlipidemia, unspecified: Secondary | ICD-10-CM

## 2014-03-26 DIAGNOSIS — N184 Chronic kidney disease, stage 4 (severe): Secondary | ICD-10-CM | POA: Diagnosis not present

## 2014-03-26 DIAGNOSIS — E1165 Type 2 diabetes mellitus with hyperglycemia: Secondary | ICD-10-CM

## 2014-03-26 DIAGNOSIS — Z0000 Encounter for general adult medical examination without abnormal findings: Secondary | ICD-10-CM

## 2014-03-26 DIAGNOSIS — IMO0002 Reserved for concepts with insufficient information to code with codable children: Secondary | ICD-10-CM

## 2014-03-26 DIAGNOSIS — D649 Anemia, unspecified: Secondary | ICD-10-CM

## 2014-03-26 DIAGNOSIS — I129 Hypertensive chronic kidney disease with stage 1 through stage 4 chronic kidney disease, or unspecified chronic kidney disease: Secondary | ICD-10-CM

## 2014-03-26 LAB — COMPLETE METABOLIC PANEL WITH GFR
ALBUMIN: 3.4 g/dL — AB (ref 3.5–5.2)
ALT: <8 U/L (ref 0–53)
AST: 9 U/L (ref 0–37)
Alkaline Phosphatase: 104 U/L (ref 39–117)
BUN: 49 mg/dL — AB (ref 6–23)
CALCIUM: 8.5 mg/dL (ref 8.4–10.5)
CHLORIDE: 107 meq/L (ref 96–112)
CO2: 21 meq/L (ref 19–32)
Creat: 2.69 mg/dL — ABNORMAL HIGH (ref 0.50–1.35)
GFR, EST AFRICAN AMERICAN: 27 mL/min — AB
GFR, EST NON AFRICAN AMERICAN: 23 mL/min — AB
GLUCOSE: 290 mg/dL — AB (ref 70–99)
POTASSIUM: 3.7 meq/L (ref 3.5–5.3)
SODIUM: 139 meq/L (ref 135–145)
TOTAL PROTEIN: 6.5 g/dL (ref 6.0–8.3)
Total Bilirubin: 0.5 mg/dL (ref 0.2–1.2)

## 2014-03-26 NOTE — Assessment & Plan Note (Signed)
Patient again declined referral for colonoscopy, FOB testing, and all immunizations due, he also declined hepatitis C screening.  He agreed to schedule an appointment with his ophthalmologist for eye exam; he declined offer to schedule the appointment for him.

## 2014-03-26 NOTE — Assessment & Plan Note (Signed)
Lab Results  Component Value Date   HGBA1C 8.3 02/12/2014   HGBA1C 6.8 11/13/2013   HGBA1C 7.2 07/31/2013     Assessment: Diabetes control: fair control Progress toward A1C goal:  unchanged Comments: Patient appears to be doing well on his increased dose of glipizide 5 mg before breakfast and 2.5 mg before supper.  He does not check his blood sugars at home.  He has had no symptoms of hypoglycemia.  Plan: Medications:  continue current medications Home glucose monitoring: Frequency: once a day Timing: before breakfast Instruction/counseling given: reminded to get eye exam; I offered to schedule the ophthalmology appointment for him, but patient declined and said he would schedule the appointment himself. Other plans: Recheck hemoglobin A1c at next visit.

## 2014-03-26 NOTE — Progress Notes (Signed)
   Subjective:    Patient ID: Ross Bauer, male    DOB: 12/30/1945, 69 y.o.   MRN: [id number]  HPI Patient returns for management of his diabetes mellitus, hyperlipidemia, hypertension, and other chronic medical problems.  At the time of his last visit on 02/12/2014, I increased his glipizide from 5 mg daily to a dose of 5 mg with breakfast and 2.5 mg with supper.  Patient reports no problems following that increased; he does not check his blood sugars at home.  I also increased his atorvastatin from 40 mg daily to a dose of 80 mg daily.  He reports no apparent side effects from the higher dose of atorvastatin.  Today he is feeling well without any complaints.  He reports that he has not seen his ophthalmologist Dr. Dione BoozeGroat in more than a year.   Review of Systems  Respiratory: Negative for shortness of breath.   Cardiovascular: Negative for chest pain and leg swelling.  Gastrointestinal: Negative for nausea, vomiting, abdominal pain and diarrhea.  Musculoskeletal: Negative for myalgias.  Neurological: Negative for dizziness and light-headedness.       Objective:   Physical Exam  Constitutional: No distress.  Cardiovascular: Normal rate, regular rhythm and normal heart sounds.  Exam reveals no gallop and no friction rub.   No murmur heard. No lower extremity edema.  Pulmonary/Chest: Effort normal and breath sounds normal. No respiratory distress. He has no wheezes. He has no rales.  Abdominal: Soft. Bowel sounds are normal. He exhibits no distension. There is no hepatosplenomegaly. There is no tenderness. There is no rebound and no guarding.       Assessment & Plan:

## 2014-03-26 NOTE — Assessment & Plan Note (Addendum)
Lipids:    Component Value Date/Time   CHOL 166 02/12/2014 1116   TRIG 127 02/12/2014 1116   HDL 24* 02/12/2014 1116   LDLCALC 117* 02/12/2014 1116   VLDL 25 02/12/2014 1116   CHOLHDL 6.9 02/12/2014 1116    Assessment: LDL done on 02/12/2014 was above goal on atorvastatin 40 mg daily, which is a moderate intensity statin dose.  I increased atorvastatin to a high intensity dose of 80 mg daily at that time.  Patient reports that he is doing well on atorvastatin 80 mg daily, without any apparent side effects.  Plan:   Continue atorvastatin 80 mg daily; recheck lipid panel in 3-6 months.

## 2014-03-26 NOTE — Patient Instructions (Signed)
Please schedule an appointment with your ophthalmologist for eye exam. Continue current medications.

## 2014-03-26 NOTE — Assessment & Plan Note (Signed)
Lab Results  Component Value Date   HGB 11.5* 02/12/2014   HGB 11.7* 09/17/2013   HGB 11.6* 07/31/2013   HGB 11.7* 04/11/2012    Assessment: Patient has a stable asymptomatic mild chronic anemia likely due to his chronic kidney disease.  Plan: Check serum iron, TIBC, and ferritin.

## 2014-03-26 NOTE — Assessment & Plan Note (Signed)
BP Readings from Last 3 Encounters:  03/26/14 126/54  02/12/14 133/66  12/13/13 133/67    Lab Results  Component Value Date   NA 133* 02/12/2014   K 3.7 02/12/2014   CREATININE 2.88* 02/12/2014    Assessment: Blood pressure control: controlled Progress toward BP goal:  at goal Comments: Blood pressure is at goal on amlodipine 10 mg daily, carvedilol 25 mg twice a day, clonidine 0.1 mg twice a day, and furosemide 40 mg daily.  Plan: Medications:  continue current medications

## 2014-03-26 NOTE — Assessment & Plan Note (Signed)
Lab Results  Component Value Date   CREATININE 2.88* 02/12/2014   CREATININE 2.92* 11/13/2013   CREATININE 2.9* 09/17/2013   CREATININE 2.63* 07/31/2013     Assessment: Patient has chronic kidney disease attributed by his nephrologist Dr. Lowell GuitarPowell to long-standing hypertensive nephrosclerosis with possible contribution of diabetic nephropathy.     Plan: Check a comprehensive metabolic panel today.  Patient will follow-up with Dr. Lowell GuitarPowell as scheduled.

## 2014-03-27 ENCOUNTER — Other Ambulatory Visit: Payer: Self-pay | Admitting: Internal Medicine

## 2014-03-27 ENCOUNTER — Encounter: Payer: Self-pay | Admitting: *Deleted

## 2014-03-27 DIAGNOSIS — I10 Essential (primary) hypertension: Secondary | ICD-10-CM

## 2014-03-27 LAB — IRON AND TIBC
%SAT: 27 % (ref 20–55)
Iron: 66 ug/dL (ref 42–165)
TIBC: 243 ug/dL (ref 215–435)
UIBC: 177 ug/dL (ref 125–400)

## 2014-03-27 LAB — FERRITIN: Ferritin: 291 ng/mL (ref 22–322)

## 2014-03-27 NOTE — Addendum Note (Signed)
Addended by: Margarito LinerJOINES, Adil Tugwell on: 03/27/2014 10:49 AM   Modules accepted: Orders

## 2014-03-28 NOTE — Progress Notes (Signed)
Quick Note:  Patient has stable CKD; results forwarded to his nephrologist Dr. Lowell GuitarPowell. ______

## 2014-05-19 DIAGNOSIS — N184 Chronic kidney disease, stage 4 (severe): Secondary | ICD-10-CM | POA: Diagnosis not present

## 2014-05-19 DIAGNOSIS — I10 Essential (primary) hypertension: Secondary | ICD-10-CM | POA: Diagnosis not present

## 2014-05-27 ENCOUNTER — Other Ambulatory Visit: Payer: Self-pay | Admitting: Internal Medicine

## 2014-05-27 DIAGNOSIS — E785 Hyperlipidemia, unspecified: Secondary | ICD-10-CM

## 2014-06-13 ENCOUNTER — Other Ambulatory Visit: Payer: Self-pay | Admitting: *Deleted

## 2014-06-13 DIAGNOSIS — I10 Essential (primary) hypertension: Secondary | ICD-10-CM

## 2014-06-13 MED ORDER — CARVEDILOL 25 MG PO TABS: 25.0000 mg | ORAL_TABLET | Freq: Two times a day (BID) | ORAL | Status: DC

## 2014-07-24 ENCOUNTER — Other Ambulatory Visit: Payer: Self-pay | Admitting: Internal Medicine

## 2014-07-24 DIAGNOSIS — N184 Chronic kidney disease, stage 4 (severe): Principal | ICD-10-CM

## 2014-07-24 DIAGNOSIS — E1122 Type 2 diabetes mellitus with diabetic chronic kidney disease: Secondary | ICD-10-CM

## 2014-08-18 ENCOUNTER — Other Ambulatory Visit: Payer: Self-pay | Admitting: Internal Medicine

## 2014-08-18 DIAGNOSIS — I10 Essential (primary) hypertension: Secondary | ICD-10-CM

## 2014-10-08 ENCOUNTER — Other Ambulatory Visit: Payer: Self-pay | Admitting: Internal Medicine

## 2014-10-08 DIAGNOSIS — I10 Essential (primary) hypertension: Secondary | ICD-10-CM

## 2014-10-10 ENCOUNTER — Other Ambulatory Visit: Payer: Self-pay | Admitting: Internal Medicine

## 2014-10-10 NOTE — Telephone Encounter (Signed)
Refill done today

## 2014-10-10 NOTE — Telephone Encounter (Signed)
Pt called requesting clonidine to be filled @ [facility].

## 2015-02-20 DIAGNOSIS — D509 Iron deficiency anemia, unspecified: Secondary | ICD-10-CM | POA: Diagnosis not present

## 2015-02-20 DIAGNOSIS — N184 Chronic kidney disease, stage 4 (severe): Secondary | ICD-10-CM | POA: Diagnosis not present

## 2015-02-20 DIAGNOSIS — R634 Abnormal weight loss: Secondary | ICD-10-CM | POA: Diagnosis not present

## 2015-02-20 DIAGNOSIS — I10 Essential (primary) hypertension: Secondary | ICD-10-CM | POA: Diagnosis not present

## 2015-02-20 DIAGNOSIS — E119 Type 2 diabetes mellitus without complications: Secondary | ICD-10-CM | POA: Diagnosis not present

## 2015-03-02 ENCOUNTER — Encounter: Payer: Self-pay | Admitting: Internal Medicine

## 2015-03-03 LAB — HEMOGLOBIN A1C: Hemoglobin A1C: 14.9

## 2015-05-29 ENCOUNTER — Ambulatory Visit (INDEPENDENT_AMBULATORY_CARE_PROVIDER_SITE_OTHER): Payer: Commercial Managed Care - HMO | Admitting: Internal Medicine

## 2015-05-29 VITALS — BP 137/58 | HR 64 | Temp 98.1°F | Wt 150.8 lb

## 2015-05-29 DIAGNOSIS — I10 Essential (primary) hypertension: Secondary | ICD-10-CM

## 2015-05-29 DIAGNOSIS — Z7984 Long term (current) use of oral hypoglycemic drugs: Secondary | ICD-10-CM | POA: Diagnosis not present

## 2015-05-29 DIAGNOSIS — Z0000 Encounter for general adult medical examination without abnormal findings: Secondary | ICD-10-CM

## 2015-05-29 DIAGNOSIS — E1122 Type 2 diabetes mellitus with diabetic chronic kidney disease: Secondary | ICD-10-CM

## 2015-05-29 DIAGNOSIS — I129 Hypertensive chronic kidney disease with stage 1 through stage 4 chronic kidney disease, or unspecified chronic kidney disease: Secondary | ICD-10-CM | POA: Diagnosis not present

## 2015-05-29 DIAGNOSIS — E1165 Type 2 diabetes mellitus with hyperglycemia: Secondary | ICD-10-CM | POA: Diagnosis not present

## 2015-05-29 DIAGNOSIS — E785 Hyperlipidemia, unspecified: Secondary | ICD-10-CM | POA: Diagnosis not present

## 2015-05-29 DIAGNOSIS — N184 Chronic kidney disease, stage 4 (severe): Secondary | ICD-10-CM

## 2015-05-29 DIAGNOSIS — B351 Tinea unguium: Secondary | ICD-10-CM

## 2015-05-29 LAB — GLUCOSE, CAPILLARY: GLUCOSE-CAPILLARY: 326 mg/dL — AB (ref 65–99)

## 2015-05-29 LAB — POCT GLYCOSYLATED HEMOGLOBIN (HGB A1C): Hemoglobin A1C: 10.8

## 2015-05-29 MED ORDER — FUROSEMIDE 40 MG PO TABS
40.0000 mg | ORAL_TABLET | Freq: Every day | ORAL | Status: DC
Start: 2015-05-29 — End: 2017-03-10

## 2015-05-29 MED ORDER — CLONIDINE HCL 0.1 MG PO TABS: 0.1000 mg | ORAL_TABLET | Freq: Two times a day (BID) | ORAL | Status: DC

## 2015-05-29 MED ORDER — ATORVASTATIN CALCIUM 80 MG PO TABS: 80.0000 mg | ORAL_TABLET | Freq: Every day | ORAL | Status: DC

## 2015-05-29 MED ORDER — CARVEDILOL 25 MG PO TABS: 25.0000 mg | ORAL_TABLET | Freq: Two times a day (BID) | ORAL | Status: DC

## 2015-05-29 MED ORDER — AMLODIPINE BESYLATE 10 MG PO TABS: 10.0000 mg | ORAL_TABLET | Freq: Every day | ORAL | Status: DC

## 2015-05-29 MED ORDER — GLIPIZIDE 5 MG PO TABS
ORAL_TABLET | ORAL | Status: DC
Start: 2015-05-29 — End: 2016-01-28

## 2015-05-29 NOTE — Progress Notes (Signed)
Subjective:   Patient ID: Ross Bauer male   DOB: 08/25/1945 70 y.o.   MRN: [id number]  HPI: Ross Bauer is a 70 y.o. male w/ PMHx of DM type II, HTN, CKD stage IV, HLD, and normocytic anemia, presents to the clinic today for a follow-up visit regarding his blood pressure and DM type II. Patient was seen at his nephrologist office on 02/20/15, at which time he was noted to have an HbA1c of 14.9, severely increased from his previous which was 8.3 as of 02/12/2014. He has not followed up with his PCP for quite some time and has continued to take Glipizide 5 mg in the AM, 2.5 in the PM. He does admit to drinking Pepsi throughout the day and he thinks this this is the reason for his high blood sugars. He does not check them at home and does not know what they have been recently.   He otherwise feels quite well, has not significant complaints.   Past Medical History  Diagnosis Date  . 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 Hypertension   . CKD (chronic kidney disease)     Stage 4  . ED (erectile dysfunction)   . Hernia   . Bilateral leg edema   . Hyperlipidemia   . Normocytic anemia     Hg baseline 11-12.  [name]. BDR (background diabetic retinopathy) 11/13/2013    Followed by ophthalmologist Dr. Ernesto Rutherfordobert Groat.    Current Outpatient Prescriptions  Medication Sig Dispense Refill  . amLODipine (NORVASC) 10 MG tablet Take 1 tablet (10 mg total) by mouth daily. 90 tablet 1  . aspirin 81 MG tablet Take 1 tablet (81 mg total) by mouth daily. 90 tablet 3  . atorvastatin (LIPITOR) 80 MG tablet Take 1 tablet (80 mg total) by mouth daily at 6 PM. 90 tablet 1  . carvedilol (COREG) 25 MG tablet Take 1 tablet (25 mg total) by mouth 2 (two) times daily with a meal. 180 tablet 1  . cloNIDine (CATAPRES) 0.1 MG tablet Take 1 tablet (0.1 mg total) by mouth 2 (two) times daily. 180 tablet 1  . furosemide (LASIX) 40 MG tablet Take 1 tablet (40 mg total) by mouth daily. 90 tablet 1  .  glipiZIDE (GLUCOTROL) 5 MG tablet Take 10 mg (2 tablets) with your largest meal, 5 mg (1 tablet) with second meal. 90 tablet 2  . glucose blood (ONE TOUCH ULTRA TEST) test strip Use to check blood sugars once daily. Dx code 250.00.Non-insulin dependent. (Patient not taking: Reported on 02/12/2014) 100 each 0  . Lancets 30G MISC Use to check blood sugar 1-2 times daily.     . [DISCONTINUED] rosuvastatin (CRESTOR) 40 MG tablet Take 1 tablet (40 mg total) by mouth daily. 30 tablet 5   No current facility-administered medications for this visit.    Review of Systems: General: Denies fever, chills, diaphoresis, appetite change and fatigue.  Respiratory: Denies SOB, DOE, cough, and wheezing.   Cardiovascular: Denies chest pain and palpitations.  Gastrointestinal: Denies nausea, vomiting, abdominal pain, and diarrhea.  Genitourinary: Denies dysuria, increased frequency, and flank pain. Endocrine: Denies hot or cold intolerance, polyuria, and polydipsia. Musculoskeletal: Denies myalgias, back pain, joint swelling, arthralgias and gait problem.  Skin: Denies pallor, rash and wounds.  Neurological: Denies dizziness, seizures, syncope, weakness, lightheadedness, numbness and headaches.  Psychiatric/Behavioral: Denies mood changes, and sleep disturbances.  Objective:   Physical Exam: Filed Vitals:   05/29/15 1332  BP: 137/58  Pulse: 64  Temp: 98.1 F (36.7 C)  TempSrc: Oral  Weight: 150 lb 12.8 oz (68.402 kg)  SpO2: 100%    General: Elderly AA male, alert, cooperative, NAD. HEENT: PERRL, EOMI. Moist mucus membranes Neck: Full range of motion without pain, supple, no lymphadenopathy or carotid bruits Lungs: Clear to ascultation bilaterally, normal work of respiration, no wheezes, rales, rhonchi Heart: RRR, no murmurs, gallops, or rubs Abdomen: Soft, non-tender, non-distended, BS + Extremities: No cyanosis, clubbing, or edema. Thickened/discolored toenail on right great toe.  Neurologic:  Alert & oriented X3, cranial nerves II-XII intact, strength grossly intact, sensation intact to light touch   Assessment & Plan:   Please see problem based assessment and plan.

## 2015-05-29 NOTE — Patient Instructions (Signed)
1. Please make a follow up appointment for 3 months.   2. Please take all medications as previously prescribed with the following changes:  Take Glipizide 10 mg with your largest meal of the day, 5 mg with second meal.   Continue blood pressure medications.   Next time you come to clinic, we will have you see the diabetes educator as well.   STOP drinking soda.   Keep your feet clean and dry.   STOP smoking.   3. If you have worsening of your symptoms or new symptoms arise, please call the clinic [phone]), or go to the ER immediately if symptoms are severe.  You have done a great job in taking all your medications. Please continue to do this.

## 2015-05-30 LAB — LIPID PANEL
CHOLESTEROL TOTAL: 152 mg/dL (ref 100–199)
Chol/HDL Ratio: 5.6 ratio units — ABNORMAL HIGH (ref 0.0–5.0)
HDL: 27 mg/dL — AB (ref >39)
LDL Calculated: 107 mg/dL — ABNORMAL HIGH (ref 0–99)
Triglycerides: 92 mg/dL (ref 0–149)
VLDL CHOLESTEROL CAL: 18 mg/dL (ref 5–40)

## 2015-05-30 LAB — BMP8+ANION GAP
Anion Gap: 18 mmol/L (ref 10.0–18.0)
BUN/Creatinine Ratio: 14 (ref 10–24)
BUN: 36 mg/dL — AB (ref 8–27)
CALCIUM: 8.5 mg/dL — AB (ref 8.6–10.2)
CHLORIDE: 101 mmol/L (ref 96–106)
CO2: 20 mmol/L (ref 18–29)
Creatinine, Ser: 2.52 mg/dL — ABNORMAL HIGH (ref 0.76–1.27)
GFR calc non Af Amer: 25 mL/min/{1.73_m2} — ABNORMAL LOW (ref >59)
GFR, EST AFRICAN AMERICAN: 29 mL/min/{1.73_m2} — AB (ref >59)
GLUCOSE: 293 mg/dL — AB (ref 65–99)
POTASSIUM: 4.3 mmol/L (ref 3.5–5.2)
Sodium: 139 mmol/L (ref 134–144)

## 2015-06-01 DIAGNOSIS — B351 Tinea unguium: Secondary | ICD-10-CM

## 2015-06-01 HISTORY — DX: Tinea unguium: B35.1

## 2015-06-01 NOTE — Assessment & Plan Note (Signed)
Repeat lipids. -Continue Lipitor 80 mg whs

## 2015-06-01 NOTE — Assessment & Plan Note (Signed)
Follows with Dr. Elmer PickerHecker, will call to set up appointment. Checked lipids. Get urine microalb/Cr at next clinic visit.

## 2015-06-01 NOTE — Assessment & Plan Note (Signed)
Lab Results  Component Value Date   HGBA1C 10.8 05/29/2015   HGBA1C 14.9 02/20/2015   HGBA1C 8.3 02/12/2014     Assessment: Diabetes control:  Poorly controlled Progress toward A1C goal:   Not at goal Comments: Was previously taking Glipizide 5 mg in AM, 2.5 mg in PM. Does not take Metformin d/t CKD. Discussed possibility of basal insulin, says he does not want to do this. Drinks Pepsi very frequently.   Plan: Medications:  Increase Glipizide to 10 mg with largest meal, 5 mg with smaller meal. Repeat BMP shows slightly improved Cr.  Home glucose monitoring: Frequency:  NONE Instruction/counseling given: reminded to get eye exam, reminded to bring blood glucose meter & log to each visit, reminded to bring medications to each visit, discussed foot care and discussed diet Other plans: Patient interested in meeting with diabetes educator at next clinic visit to discuss diet. Understands importance of medication compliance.

## 2015-06-01 NOTE — Assessment & Plan Note (Signed)
BP Readings from Last 3 Encounters:  05/29/15 137/58  03/26/14 126/54  02/12/14 133/66    Lab Results  Component Value Date   NA 139 05/29/2015   K 4.3 05/29/2015   CREATININE 2.52* 05/29/2015    Assessment: Blood pressure control:  Controlled Progress toward BP goal:   Not at goal Comments: Taking Lasix 40 mg daily, Clonidine 0.1 mg bid, Coreg 25 mg bid, and Norvasc 10 mg daily.   Plan: Medications:  continue current medications Other plans: Checked BMP, Cr slightly improved since his last set of labs. RTC in 3 months.

## 2015-06-01 NOTE — Assessment & Plan Note (Signed)
Follows with Dr. Lowell GuitarPowell. Cr slightly improved, at baseline.  -Continue Lasix 40 mg daily

## 2015-06-01 NOTE — Assessment & Plan Note (Signed)
Present on right great toe. Is not interested in taking Terbinafine, does not want referral to podiatry. Uninterested in treating this at this time.

## 2015-06-02 NOTE — Progress Notes (Signed)
Internal Medicine Clinic Attending  Case discussed with Dr. Jones at the time of the visit.  We reviewed the resident's history and exam and pertinent patient test results.  I agree with the assessment, diagnosis, and plan of care documented in the resident's note.  

## 2015-06-29 ENCOUNTER — Ambulatory Visit (INDEPENDENT_AMBULATORY_CARE_PROVIDER_SITE_OTHER): Payer: Commercial Managed Care - HMO | Admitting: Dietician

## 2015-06-29 DIAGNOSIS — Z713 Dietary counseling and surveillance: Secondary | ICD-10-CM | POA: Diagnosis not present

## 2015-06-29 DIAGNOSIS — E119 Type 2 diabetes mellitus without complications: Secondary | ICD-10-CM

## 2015-06-29 DIAGNOSIS — Z7984 Long term (current) use of oral hypoglycemic drugs: Secondary | ICD-10-CM

## 2015-06-29 DIAGNOSIS — N184 Chronic kidney disease, stage 4 (severe): Principal | ICD-10-CM

## 2015-06-29 DIAGNOSIS — E1122 Type 2 diabetes mellitus with diabetic chronic kidney disease: Secondary | ICD-10-CM

## 2015-06-29 NOTE — Progress Notes (Signed)
  Medical Nutrition Therapy:  Appt start time: 1330 end time:  1400. Visit # 1  Assessment:  Primary concerns today: meal planning.  Ross Bauer did not want to weigh or check his blood sugar today. He does not have a meter with him today and doe sot check his blood sugars at home. did not know he was seeing a diabetes educator today. He said he knew the doctor wanted him to talk to someone about eating healthier. He is happy with his gradual weight loss. He says he does very little activity due to back pain. He does not eat out often, he does his own food shopping and meal preparation. He denies problem with bowels, affording food or being able to prepare his own food.   Preferred Learning Style: No preference indicated  Learning Readiness: Contemplating/Ready/ Change in progress  ANTHROPOMETRICS: weight-151, BMI-24- within normal limits WEIGHT HISTORY:has lost ~ 15 # gradually over the past 2 years SLEEP:"fairly well" from 9-10 Pm to 4-5 Am MEDICATIONS: glipizide twice a day BLOOD SUGAR:does not check at home DIETARY INTAKE: Usual eating pattern includes 2 meals and 3 snacks per day.  Avoided foods include milk, yogurt.   24-hr recall: shows that he is probably eating at least 1400-1600 calories/day with 190-210 g carb and 50% of this is coming from sugar in his tea B ( AM): fruit cup water or tea L ( 11 am): egg bacon sandwich with water or tea Snack ( PM): banana or fruit cup, a few slices [place] lunch meat Snack- fruit & a few more slices [place] lunch meat, tea D ( 5-7 PM): steak or chicken wings from [employer] take out, greens, mac & cheese and sweet tea Beverages: was drinking 2liters pepsi per day, switched to 1/2 gallon [employer] sweet tea/day( 800 calories & 200 g carb/day)  and now has decreased that to ~ 1/4 gallon(~400 calories & 100 g carb/day)  per day  Usual physical activity: ADLS and light walking  Estimated daily energy needs: 1700 calories to maintain weight 191-200 g  carbohydrates/day   Progress Towards Goal(s):  In progress.   Nutritional Diagnosis:  NI-5.8.3 Inappropriate intake of types of carbohydrates (specify): excess sugar intake As related to his sweet tea comsumption.  As evidenced by his report and high blood sugar.    Intervention:  With permission from [name] provided Nutrition education and recommendation for increased vegetable, whole grain and possibly fruit intake instead of sugar. Offered sample and coupons for nutritional supplement but patient declined.  Coordination of care: none today  Teaching Method Utilized: Visual and Auditory  Handouts given during visit include: none given per patient preference Barriers to learning/adherence to lifestyle change: competing values Demonstrated degree of understanding via:  Teach Back   Monitoring/Evaluation:  Dietary intake, exercise, and body weight prn.

## 2015-08-27 ENCOUNTER — Encounter: Payer: Self-pay | Admitting: Internal Medicine

## 2015-08-28 ENCOUNTER — Encounter: Payer: Self-pay | Admitting: Internal Medicine

## 2015-08-28 ENCOUNTER — Ambulatory Visit (INDEPENDENT_AMBULATORY_CARE_PROVIDER_SITE_OTHER): Payer: Commercial Managed Care - HMO | Admitting: Internal Medicine

## 2015-08-28 VITALS — BP 123/55 | HR 68 | Temp 98.1°F | Wt 153.4 lb

## 2015-08-28 DIAGNOSIS — E1122 Type 2 diabetes mellitus with diabetic chronic kidney disease: Secondary | ICD-10-CM | POA: Diagnosis not present

## 2015-08-28 DIAGNOSIS — Z7984 Long term (current) use of oral hypoglycemic drugs: Secondary | ICD-10-CM

## 2015-08-28 DIAGNOSIS — Z0000 Encounter for general adult medical examination without abnormal findings: Secondary | ICD-10-CM

## 2015-08-28 DIAGNOSIS — I129 Hypertensive chronic kidney disease with stage 1 through stage 4 chronic kidney disease, or unspecified chronic kidney disease: Secondary | ICD-10-CM

## 2015-08-28 DIAGNOSIS — E785 Hyperlipidemia, unspecified: Secondary | ICD-10-CM | POA: Diagnosis not present

## 2015-08-28 DIAGNOSIS — Z720 Tobacco use: Secondary | ICD-10-CM

## 2015-08-28 DIAGNOSIS — M545 Low back pain: Secondary | ICD-10-CM

## 2015-08-28 DIAGNOSIS — I10 Essential (primary) hypertension: Secondary | ICD-10-CM

## 2015-08-28 DIAGNOSIS — N184 Chronic kidney disease, stage 4 (severe): Secondary | ICD-10-CM | POA: Diagnosis not present

## 2015-08-28 DIAGNOSIS — F17210 Nicotine dependence, cigarettes, uncomplicated: Secondary | ICD-10-CM

## 2015-08-28 DIAGNOSIS — G8929 Other chronic pain: Secondary | ICD-10-CM

## 2015-08-28 DIAGNOSIS — Z79899 Other long term (current) drug therapy: Secondary | ICD-10-CM

## 2015-08-28 LAB — GLUCOSE, CAPILLARY: Glucose-Capillary: 223 mg/dL — ABNORMAL HIGH (ref 65–99)

## 2015-08-28 LAB — POCT GLYCOSYLATED HEMOGLOBIN (HGB A1C): Hemoglobin A1C: 7.8

## 2015-08-28 NOTE — Assessment & Plan Note (Signed)
Assessment  He continues to have chronic low back pain after a fall from a ladder in 1977. He wears a back brace which helps some. Because of his stage IV chronic kidney disease he is not a candidate for NSAID therapy. He is not interested in chronic medications such as acetaminophen or tramadol at this time as he does not like the thought of taking unnecessary medications.  Plan  I encouraged him to consider the acetaminophen in the future should his back pain result in a decrease in his functionality. We could also use renally adjusted tramadol should he require pharmacotherapy for his chronic low back pain. We will reassess the quality of his life and his functionality stemming from this chronic low back pain at the follow-up visit.

## 2015-08-28 NOTE — Progress Notes (Signed)
   Subjective:    Patient ID: Ross Bauer, male    DOB: 02/22/1945, 70 y.o.   MRN: [id number]  HPI  Ross Bauer is here for follow-up of his diabetes, hypertension, tobacco abuse, stage 4 chronic kidney disease, and chronic low back pain. Please see the A&P for the status of the pt's chronic medical problems.  Review of Systems  Constitutional: Negative for activity change, appetite change and unexpected weight change.  Respiratory: Negative for chest tightness, shortness of breath and wheezing.   Cardiovascular: Negative for chest pain, palpitations and leg swelling.  Gastrointestinal: Negative for abdominal pain, diarrhea, nausea and vomiting.  Genitourinary: Positive for frequency. Negative for urgency.  Musculoskeletal: Positive for back pain. Negative for arthralgias, gait problem, joint swelling, myalgias, neck pain and neck stiffness.  Skin: Negative for color change, pallor, rash and wound.  Neurological: Negative for dizziness, seizures, weakness and light-headedness.  Psychiatric/Behavioral: Negative for dysphoric mood. The patient is not nervous/anxious.       Objective:   Physical Exam  Constitutional: He is oriented to person, place, and time. He appears well-developed and well-nourished. No distress.  HENT:  Head: Normocephalic and atraumatic.  Eyes: Conjunctivae are normal. Right eye exhibits no discharge. Left eye exhibits no discharge. No scleral icterus.  Cardiovascular: Normal rate, regular rhythm and normal heart sounds.  Exam reveals no gallop and no friction rub.   No murmur heard. Pulmonary/Chest: Effort normal and breath sounds normal. No respiratory distress. He has no wheezes. He has no rales.  Abdominal: Soft. Bowel sounds are normal. He exhibits no distension. There is no tenderness. There is no rebound and no guarding.  Musculoskeletal: Normal range of motion. He exhibits no edema, tenderness or deformity.  Neurological: He is alert and oriented to  person, place, and time. He exhibits normal muscle tone.  Skin: Skin is warm and dry. No rash noted. He is not diaphoretic. No erythema.  Psychiatric: He has a normal mood and affect. His behavior is normal. Judgment and thought content normal.  Nursing note and vitals reviewed.     Assessment & Plan:   Please see problem oriented charting.

## 2015-08-28 NOTE — Assessment & Plan Note (Signed)
He has a long-standing history of not being interested in any health maintenance. He specifically is not interested in the Zostavax, Pneumovax, and tetanus booster at this time. He also remains uninterested in screening for colon cancer with either a fecal occult blood test or colonoscopy. Finally, he has no interest and hepatitis C screening. We will reassess his interest in health care maintenance at the follow-up visit.

## 2015-08-28 NOTE — Assessment & Plan Note (Signed)
Assessment  His blood pressure is well controlled today on his current regimen and is well below target at 123/55. This is on furosemide 40 mg by mouth daily, carvedilol 25 mg by mouth twice daily, and amlodipine 10 mg by mouth daily. He does not take his blood pressure at home but states he'll take an occasional clonidine 0.1 mg by mouth when he feels his blood pressure may be elevated. This occurs no more often than once a week.  Plan  We will continue the furosemide 40 mg by mouth daily given his chronic renal insufficiency and the likely component of hypervolemia contributing to his hypertension, carvedilol 25 mg by mouth twice daily, and amlodipine 10 mg by mouth daily. He will continue the clonidine as needed for now although ultimately I would like to stop this medication as I believe in the near future he may require a peripheral alpha-blocker such as terazosin for bladder outlet obstruction symptoms. We will reassess the blood pressure on the above regimen at the follow-up visit.

## 2015-08-28 NOTE — Assessment & Plan Note (Signed)
Assessment  He is tolerating the atorvastatin 80 mg by mouth daily without myalgias or other symptoms.  Plan  He will continue the high intensity statin and we will reassess for intolerances at the follow-up visit.

## 2015-08-28 NOTE — Assessment & Plan Note (Signed)
Assessment  Ross Bauer is in the pre-contemplative stage of smoking cessation. He states he's not mentally ready to quit at this time.  Plan  I reminded him of the positive health effects of smoking cessation and told him that I would be bringing this issue up at each of our visits. We will reassess his interest in smoking cessation at the follow-up visit.

## 2015-08-28 NOTE — Patient Instructions (Signed)
It was nice to meet you.  I look forward to caring for you for years to come.  1) Keep taking the medications as you are.  2) Please schedule an eye examination with Dr. Dione BoozeGroat.  3) Keep avoiding the non-diet sodas.  I will see you back in 3 months, sooner if necessary.

## 2015-08-28 NOTE — Assessment & Plan Note (Signed)
Assessment  His diabetic troll is improved from the last visit although still not at goal of a hemoglobin A1c less than 7.0. This is on glipizide 10 mg in the morning and 5 mg in the evening. The only change he made to lower his hemoglobin A1c from 10.8-7.8 was to stop drinking his Pepsi-Cola's. His stage IV chronic kidney disease was stable when last checked with a BMP and May 2017.  Plan  We will continue glipizide at 10 mg by mouth in the morning and 5 mg by mouth in the evening. I again stressed the importance of avoiding colas and he states he is now on board. A urine for microalbumin was checked at this visit and is pending at the time of this dictation. He states he will schedule an appointment with his ophthalmologist. He is otherwise up-to-date on his diabetes health care maintenance. We will reassess the control of his diabetes with a repeat hemoglobin A1c at the follow-up visit. There was no indication to repeat a basic metabolic panel given the most recent value was stable less than 3 months ago.

## 2015-08-29 LAB — MICROALBUMIN / CREATININE URINE RATIO
CREATININE, UR: 106.9 mg/dL
MICROALB/CREAT RATIO: 148.8 mg/g creat — ABNORMAL HIGH (ref 0.0–30.0)
MICROALBUM., U, RANDOM: 159.1 ug/mL

## 2015-09-01 NOTE — Progress Notes (Signed)
Patient ID: Ross Bauer, male   DOB: 03/16/1945, 70 y.o.   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  He has some sore of intolerance to ACEI and I am not sure I would want to start an ACEI or ARB now anyway given that he already has stage IV chronic kidney disease as this would like result in a further decline in his GFR without much long term benefit.  I could also change his amlodipine to something like verapamil to decrease his proteinuria but I feel this would compromise his blood pressure control which I believe is even more important in delaying further decrement of his small vessel vascular disease.  I will therefore keep his regimen as is.  I tried calling him with this information but only received an unidentified voice mail.  A message was therefore not left.

## 2015-10-13 DIAGNOSIS — N184 Chronic kidney disease, stage 4 (severe): Secondary | ICD-10-CM | POA: Diagnosis not present

## 2015-10-13 DIAGNOSIS — I10 Essential (primary) hypertension: Secondary | ICD-10-CM | POA: Diagnosis not present

## 2015-10-13 DIAGNOSIS — R634 Abnormal weight loss: Secondary | ICD-10-CM | POA: Diagnosis not present

## 2015-11-16 ENCOUNTER — Encounter: Payer: Self-pay | Admitting: Nephrology

## 2015-11-17 ENCOUNTER — Other Ambulatory Visit: Payer: Self-pay | Admitting: Internal Medicine

## 2015-11-17 DIAGNOSIS — I10 Essential (primary) hypertension: Secondary | ICD-10-CM

## 2015-12-09 ENCOUNTER — Encounter: Payer: Self-pay | Admitting: Nephrology

## 2015-12-09 ENCOUNTER — Other Ambulatory Visit: Payer: Self-pay | Admitting: Internal Medicine

## 2015-12-09 DIAGNOSIS — I10 Essential (primary) hypertension: Secondary | ICD-10-CM

## 2015-12-09 MED ORDER — CARVEDILOL 25 MG PO TABS: 25.0000 mg | ORAL_TABLET | Freq: Two times a day (BID) | ORAL | 3 refills | Status: DC

## 2015-12-15 ENCOUNTER — Telehealth: Payer: Self-pay | Admitting: Internal Medicine

## 2015-12-15 NOTE — Telephone Encounter (Signed)
Called patient this pm, but no answer.  Left detailed asking to call the clinic back and ask for me, in reference to [facility]

## 2015-12-17 NOTE — Progress Notes (Signed)
Old documentation 

## 2016-01-28 ENCOUNTER — Other Ambulatory Visit: Payer: Self-pay | Admitting: Internal Medicine

## 2016-01-28 DIAGNOSIS — E1122 Type 2 diabetes mellitus with diabetic chronic kidney disease: Secondary | ICD-10-CM

## 2016-01-28 DIAGNOSIS — N184 Chronic kidney disease, stage 4 (severe): Principal | ICD-10-CM

## 2016-01-29 ENCOUNTER — Ambulatory Visit: Payer: Commercial Managed Care - HMO | Admitting: Internal Medicine

## 2016-01-29 ENCOUNTER — Encounter: Payer: Self-pay | Admitting: Internal Medicine

## 2016-02-01 ENCOUNTER — Other Ambulatory Visit: Payer: Self-pay | Admitting: *Deleted

## 2016-02-01 DIAGNOSIS — E785 Hyperlipidemia, unspecified: Secondary | ICD-10-CM

## 2016-02-01 NOTE — Telephone Encounter (Signed)
Pt states he missed 01/29/2016 due to weather and has been rescheduled for 03/04/2016.  Will send refill request to MD for review, please advise.Kingsley SpittleGoldston, Darlene Cassady1/22/20181:27 PM

## 2016-02-02 MED ORDER — ATORVASTATIN CALCIUM 80 MG PO TABS: 80.0000 mg | ORAL_TABLET | Freq: Every day | ORAL | 3 refills | Status: DC

## 2016-03-04 ENCOUNTER — Ambulatory Visit (INDEPENDENT_AMBULATORY_CARE_PROVIDER_SITE_OTHER): Payer: Medicare HMO | Admitting: Internal Medicine

## 2016-03-04 ENCOUNTER — Encounter: Payer: Self-pay | Admitting: Internal Medicine

## 2016-03-04 ENCOUNTER — Encounter (INDEPENDENT_AMBULATORY_CARE_PROVIDER_SITE_OTHER): Payer: Self-pay

## 2016-03-04 VITALS — BP 125/46 | HR 54 | Temp 98.2°F | Wt 157.2 lb

## 2016-03-04 DIAGNOSIS — N184 Chronic kidney disease, stage 4 (severe): Secondary | ICD-10-CM | POA: Diagnosis not present

## 2016-03-04 DIAGNOSIS — Z7984 Long term (current) use of oral hypoglycemic drugs: Secondary | ICD-10-CM

## 2016-03-04 DIAGNOSIS — G8929 Other chronic pain: Secondary | ICD-10-CM

## 2016-03-04 DIAGNOSIS — E1122 Type 2 diabetes mellitus with diabetic chronic kidney disease: Secondary | ICD-10-CM

## 2016-03-04 DIAGNOSIS — Z716 Tobacco abuse counseling: Secondary | ICD-10-CM | POA: Diagnosis not present

## 2016-03-04 DIAGNOSIS — F17210 Nicotine dependence, cigarettes, uncomplicated: Secondary | ICD-10-CM | POA: Diagnosis not present

## 2016-03-04 DIAGNOSIS — I129 Hypertensive chronic kidney disease with stage 1 through stage 4 chronic kidney disease, or unspecified chronic kidney disease: Secondary | ICD-10-CM

## 2016-03-04 DIAGNOSIS — Z79899 Other long term (current) drug therapy: Secondary | ICD-10-CM

## 2016-03-04 DIAGNOSIS — M545 Low back pain: Secondary | ICD-10-CM

## 2016-03-04 DIAGNOSIS — I10 Essential (primary) hypertension: Secondary | ICD-10-CM

## 2016-03-04 DIAGNOSIS — E785 Hyperlipidemia, unspecified: Secondary | ICD-10-CM | POA: Diagnosis not present

## 2016-03-04 DIAGNOSIS — Z720 Tobacco use: Secondary | ICD-10-CM

## 2016-03-04 DIAGNOSIS — Z0000 Encounter for general adult medical examination without abnormal findings: Secondary | ICD-10-CM

## 2016-03-04 LAB — POCT GLYCOSYLATED HEMOGLOBIN (HGB A1C): HEMOGLOBIN A1C: 9.4

## 2016-03-04 LAB — GLUCOSE, CAPILLARY: GLUCOSE-CAPILLARY: 295 mg/dL — AB (ref 65–99)

## 2016-03-04 MED ORDER — GLIPIZIDE 10 MG PO TABS: 10.0000 mg | ORAL_TABLET | Freq: Two times a day (BID) | ORAL | 3 refills | Status: DC

## 2016-03-04 MED ORDER — ATORVASTATIN CALCIUM 80 MG PO TABS: 80.0000 mg | ORAL_TABLET | Freq: Every day | ORAL | 3 refills | Status: DC

## 2016-03-04 MED ORDER — CLONIDINE HCL 0.1 MG PO TABS: 0.1000 mg | ORAL_TABLET | Freq: Two times a day (BID) | ORAL | 0 refills | Status: DC | PRN

## 2016-03-04 NOTE — Patient Instructions (Signed)
It was good to see you again.  1) We increased your glipizide for your diabetes to 10 mg twice a day.  Take two 5 mg tablets until you run out.  When you get your new prescription you will only need to take 1 tablet (10 mg) twice a day.  2) Try to avoid sugary drinks as much as possible.  When you did so last time your diabetes was much improved.  3) I refilled your clonidine and you atorvastatin (for your cholesterol.  4) Keep taking the other medications as you are.  5) Let me know if you change your mind about smoking, getting vaccinations, or other screening.  I will see you in three months, sooner if necessary.

## 2016-03-04 NOTE — Assessment & Plan Note (Signed)
Assessment  He continues to have intermittent back pain which responds reasonably well symptomatically to Tylenol. He is not interested in any other medication at this time.  Plan  We will continue the as needed Tylenol for his chronic low back pain. We will reassess the efficacy of this therapy at the follow-up visit.

## 2016-03-04 NOTE — Assessment & Plan Note (Signed)
Assessment  He had been tolerating the atorvastatin 80 mg by mouth daily without myalgias. He recently ran out of the medication and was questioning whether or not he needed to take it. I reviewed his [employer] 10 year risk calculator results and informed him that in the next 10 years he has a 54% chance of a cardiovascular event. He therefore agreed to continue with the atorvastatin.  Plan  We will continue the high intensity statin and reassess for intolerances at the follow-up visit.

## 2016-03-04 NOTE — Assessment & Plan Note (Signed)
Assessment  His blood pressure was well controlled today at 125/46. This is on carvedilol 25 mg by mouth twice daily, amlodipine 10 mg by mouth daily, furosemide 40 mg by mouth daily, and as needed clonidine 0.1 mg. I stated he did not need the clonidine on an as-needed basis and offered to stop this medication, but he preferred to continue it.  Plan  We will continue the carvedilol, amlodipine, and furosemide at the current doses. I also renewed his as needed clonidine. We will reassess his blood pressure control at the follow-up visit.

## 2016-03-04 NOTE — Assessment & Plan Note (Signed)
Assessment  He continues to smoke and remains in the pre-contemplative stage with regards to tobacco cessation. We spent at least 4 minutes discussing the importance of smoking cessation on his overall health.  Plan  We will readdress his interests and tobacco cessation at the follow-up visit. He was asked to call me were he to change his mind and was interested in pharmacologic help for smoking cessation.

## 2016-03-04 NOTE — Progress Notes (Signed)
   Subjective:    Patient ID: Ross Bauer, male    DOB: 12/03/1945, 71 y.o.   MRN: [id number]  HPI  Ross Bauer is here for follow-up of his diabetes, hyperlipidemia, hypertension, and tobacco abuse. Please see the A&P for the status of the pt's chronic medical problems.  Review of Systems  Constitutional: Negative for activity change, appetite change and unexpected weight change.  Respiratory: Negative for chest tightness and shortness of breath.   Cardiovascular: Negative for chest pain, palpitations and leg swelling.  Gastrointestinal: Negative for abdominal distention, abdominal pain, constipation, diarrhea, nausea and vomiting.  Musculoskeletal: Positive for back pain. Negative for arthralgias, joint swelling and myalgias.  Skin: Negative for rash and wound.      Objective:   Physical Exam  Constitutional: He is oriented to person, place, and time. He appears well-developed and well-nourished. No distress.  HENT:  Head: Normocephalic and atraumatic.  Cardiovascular: Normal rate, regular rhythm and normal heart sounds.  Exam reveals no gallop and no friction rub.   No murmur heard. Pulmonary/Chest: Effort normal and breath sounds normal. No respiratory distress. He has no wheezes. He has no rales.  Abdominal: Soft. Bowel sounds are normal. He exhibits no distension. There is no tenderness. There is no rebound and no guarding.  Musculoskeletal: Normal range of motion. He exhibits no edema, tenderness or deformity.  Neurological: He is alert and oriented to person, place, and time. He exhibits normal muscle tone.  Skin: Skin is warm and dry. No rash noted. He is not diaphoretic. No erythema.  Psychiatric: He has a normal mood and affect. His behavior is normal. Judgment and thought content normal.  Nursing note and vitals reviewed.     Assessment & Plan:   Please see problem oriented charting.

## 2016-03-04 NOTE — Assessment & Plan Note (Signed)
Assessment  His diabetic control has deteriorated over the last 6 months. His hemoglobin A1c today is 9.4 where it was 7.8 previously. He attributes this to drinking a lot of [employer] sweet tea. He states he's been compliant with his glipizide.  Plan  We discussed the importance of avoiding sugary drinks. I recommended that he avoid [employer] sweet tea. We will also increase the glipizide to 10 mg by mouth twice daily. We will reassess his glycemic control at the follow-up visit with a repeat hemoglobin A1c. He states he will schedule a follow-up diabetic retinal examination with Dr. Dione BoozeGroat.

## 2016-03-04 NOTE — Assessment & Plan Note (Signed)
He remains uninterested in any screening or preventative measures. He specifically refused a flu vaccination, Pneumovax, tetanus booster, colon cancer screening, and a hepatitis C assessment. We will reassess his interest in health care maintenance at the follow-up visit.

## 2016-05-13 DIAGNOSIS — N184 Chronic kidney disease, stage 4 (severe): Secondary | ICD-10-CM | POA: Diagnosis not present

## 2016-05-13 DIAGNOSIS — F17210 Nicotine dependence, cigarettes, uncomplicated: Secondary | ICD-10-CM | POA: Diagnosis not present

## 2016-05-13 DIAGNOSIS — I10 Essential (primary) hypertension: Secondary | ICD-10-CM | POA: Diagnosis not present

## 2016-05-13 DIAGNOSIS — R634 Abnormal weight loss: Secondary | ICD-10-CM | POA: Diagnosis not present

## 2016-06-03 ENCOUNTER — Ambulatory Visit (INDEPENDENT_AMBULATORY_CARE_PROVIDER_SITE_OTHER): Payer: Medicare HMO | Admitting: Internal Medicine

## 2016-06-03 ENCOUNTER — Encounter: Payer: Self-pay | Admitting: Internal Medicine

## 2016-06-03 VITALS — BP 138/52 | HR 65 | Temp 97.9°F | Wt 152.3 lb

## 2016-06-03 DIAGNOSIS — N184 Chronic kidney disease, stage 4 (severe): Secondary | ICD-10-CM

## 2016-06-03 DIAGNOSIS — I129 Hypertensive chronic kidney disease with stage 1 through stage 4 chronic kidney disease, or unspecified chronic kidney disease: Secondary | ICD-10-CM | POA: Diagnosis not present

## 2016-06-03 DIAGNOSIS — M545 Low back pain, unspecified: Secondary | ICD-10-CM

## 2016-06-03 DIAGNOSIS — E11319 Type 2 diabetes mellitus with unspecified diabetic retinopathy without macular edema: Secondary | ICD-10-CM

## 2016-06-03 DIAGNOSIS — G8929 Other chronic pain: Secondary | ICD-10-CM

## 2016-06-03 DIAGNOSIS — Z79899 Other long term (current) drug therapy: Secondary | ICD-10-CM

## 2016-06-03 DIAGNOSIS — E113299 Type 2 diabetes mellitus with mild nonproliferative diabetic retinopathy without macular edema, unspecified eye: Secondary | ICD-10-CM

## 2016-06-03 DIAGNOSIS — F17210 Nicotine dependence, cigarettes, uncomplicated: Secondary | ICD-10-CM | POA: Diagnosis not present

## 2016-06-03 DIAGNOSIS — I10 Essential (primary) hypertension: Secondary | ICD-10-CM

## 2016-06-03 DIAGNOSIS — Z0000 Encounter for general adult medical examination without abnormal findings: Secondary | ICD-10-CM

## 2016-06-03 DIAGNOSIS — Z7984 Long term (current) use of oral hypoglycemic drugs: Secondary | ICD-10-CM | POA: Diagnosis not present

## 2016-06-03 DIAGNOSIS — Z720 Tobacco use: Secondary | ICD-10-CM

## 2016-06-03 DIAGNOSIS — E1122 Type 2 diabetes mellitus with diabetic chronic kidney disease: Secondary | ICD-10-CM | POA: Diagnosis not present

## 2016-06-03 DIAGNOSIS — E785 Hyperlipidemia, unspecified: Secondary | ICD-10-CM | POA: Diagnosis not present

## 2016-06-03 DIAGNOSIS — E7849 Other hyperlipidemia: Secondary | ICD-10-CM

## 2016-06-03 LAB — POCT GLYCOSYLATED HEMOGLOBIN (HGB A1C): Hemoglobin A1C: 7.3

## 2016-06-03 LAB — GLUCOSE, CAPILLARY: Glucose-Capillary: 215 mg/dL — ABNORMAL HIGH (ref 65–99)

## 2016-06-03 NOTE — Assessment & Plan Note (Signed)
Assessment  His back pain is well controlled symptomatically on as needed Tylenol.  Plan  We will continue with the as needed Tylenol for the back pain and reassess symptom control at the follow-up visit.

## 2016-06-03 NOTE — Patient Instructions (Signed)
It was great to see you again.  You have done an outstanding job with your diabetes over the last three months.  Keep up the great work.  I am really glad to hear you are going to try the non-sugary alternatives.  This should really help with your sugar.  1) Keep taking the medications as you are.  2) Please schedule the eye exam as we discussed.  3) Let me know if you change your mind about wanting to quit smoking or with any of the cancer screening or immunizations (shots).  I will see you back in 3 months, sooner if necessary.

## 2016-06-03 NOTE — Assessment & Plan Note (Signed)
Assessment  His diabetes is much better controlled today with a hemoglobin A1c of 7.3 down from 9.4 three months ago. He has been more cognizant of his diet and trying to avoid sweets. He admits his activity has not increased or changed. Currently he is on glipizide 10 mg twice daily. With regards to his stage IV chronic kidney disease he is without signs or symptoms of uremia and has been followed closely by Dr. Lowell GuitarPowell in nephrology.  Plan  We will continue the glipizide at 10 mg by mouth twice daily. We had a long discussion about non-sugary drink alternatives as well as other ways to avoid sugar by considering more use of sugar substitutes. His kidney disease, when last checked by Dr. Lowell GuitarPowell 2 months ago, was stable and he will continue to follow closely with his nephrologist. We will reassess his diabetic control at the follow-up visit with a repeat hemoglobin A1c.

## 2016-06-03 NOTE — Assessment & Plan Note (Signed)
Assessment  He is tolerating the atorvastatin 80 mg by mouth daily without myalgias.  Plan  We will continue this high intensity statin and reassess for intolerances at the follow-up visit.

## 2016-06-03 NOTE — Assessment & Plan Note (Signed)
Assessment  He has yet to schedule a follow-up appointment with his ophthalmologist Dr. Dione BoozeGroat to reassess his background diabetic retinopathy.  Plan  He was encouraged to schedule appointment with Dr. Dione BoozeGroat. We will follow-up on whether or not this was done at the return visit.

## 2016-06-03 NOTE — Assessment & Plan Note (Signed)
Assessment  We again spent more than 4 minutes discussing the harms to his health from continued smoking. At this point he remains in the pre-contemplative stage. That being said, approximately 2 months ago he tried a nicotine patch in hopes of quitting. Unfortunately, he found the patch caused burning on his skin. Because of his stage IV chronic kidney disease he would not be a candidate for Chantix.  Plan  I asked him to call me if he were to change his mind once again and desire assistance in smoking cessation. We will reassess his willingness to begin the process of tobacco cessation at the follow-up visit.

## 2016-06-03 NOTE — Assessment & Plan Note (Signed)
Assessment  He continues to be uninterested in basic healthcare maintenance and screening. In particular, he continues to defer hepatitis C testing, colon cancer screening with the fit test, a tetanus and diphtheria booster, and a Prevnar Pneumovax. We will reassess his willingness to undergo cancer screening and immunizations at the follow-up visit.

## 2016-06-03 NOTE — Progress Notes (Signed)
   Subjective:    Patient ID: Ross Bauer, male    DOB: 01/03/1946, 71 y.o.   MRN: [id number]  HPI  Ross ManifoldJulius L Shammas is here for follow-up of his diabetes, hypertension, hyperlipidemia, and tobacco abuse. Please see the A&P for the status of the pt's chronic medical problems.  Review of Systems  Constitutional: Negative for activity change, appetite change and unexpected weight change.  Respiratory: Negative for chest tightness, shortness of breath and wheezing.   Cardiovascular: Negative for chest pain, palpitations and leg swelling.  Gastrointestinal: Negative for abdominal pain, constipation, diarrhea, nausea and vomiting.  Musculoskeletal: Positive for back pain. Negative for arthralgias, gait problem, joint swelling and myalgias.      Objective:   Physical Exam  Constitutional: He is oriented to person, place, and time. He appears well-developed and well-nourished. No distress.  HENT:  Head: Normocephalic and atraumatic.  Eyes: Conjunctivae are normal. Right eye exhibits no discharge. Left eye exhibits no discharge. No scleral icterus.  Cardiovascular: Normal rate, regular rhythm and normal heart sounds.  Exam reveals no gallop and no friction rub.   No murmur heard. Pulmonary/Chest: Effort normal and breath sounds normal. No respiratory distress. He has no wheezes. He has no rales.  Abdominal: Soft. Bowel sounds are normal. He exhibits no distension. There is no tenderness. There is no rebound and no guarding.  Musculoskeletal: Normal range of motion. He exhibits no edema, tenderness or deformity.  Neurological: He is alert and oriented to person, place, and time. He exhibits normal muscle tone.  Skin: Skin is warm and dry. No rash noted. He is not diaphoretic. No erythema.  Psychiatric: He has a normal mood and affect. His behavior is normal. Judgment and thought content normal.  Nursing note and vitals reviewed.     Assessment & Plan:   Please see problem based  charting.

## 2016-06-03 NOTE — Assessment & Plan Note (Signed)
Assessment  He currently remains asymptomatic despite having stage IV chronic kidney disease. A recent intact PTH was 59. Dr. Lowell GuitarPowell of nephrology is following him closely.  Plan  We will continue aggressive management of his risk factors including controlling his diabetes, hypertension, and hyperlipidemia. We will also continue to reassess and offer assistance with tobacco cessation.

## 2016-06-03 NOTE — Assessment & Plan Note (Signed)
Assessment  His blood pressure today was 138/52 which is at target. This is on amlodipine 10 mg by mouth daily, carvedilol 25 mg by mouth twice daily, Lasix 40 mg by mouth daily, and clonidine 0.1 mg every 12 hours as needed.  Plan  We will continue the amlodipine, carvedilol, Lasix, and clonidine at the current doses and reassess his blood pressure control at the follow-up visit.

## 2016-09-30 ENCOUNTER — Encounter: Payer: Self-pay | Admitting: Internal Medicine

## 2016-09-30 ENCOUNTER — Ambulatory Visit (INDEPENDENT_AMBULATORY_CARE_PROVIDER_SITE_OTHER): Payer: Medicare HMO | Admitting: Internal Medicine

## 2016-09-30 VITALS — BP 142/50 | HR 62 | Temp 98.2°F | Wt 154.9 lb

## 2016-09-30 DIAGNOSIS — F17210 Nicotine dependence, cigarettes, uncomplicated: Secondary | ICD-10-CM

## 2016-09-30 DIAGNOSIS — N401 Enlarged prostate with lower urinary tract symptoms: Secondary | ICD-10-CM | POA: Diagnosis not present

## 2016-09-30 DIAGNOSIS — E785 Hyperlipidemia, unspecified: Secondary | ICD-10-CM | POA: Diagnosis not present

## 2016-09-30 DIAGNOSIS — N184 Chronic kidney disease, stage 4 (severe): Secondary | ICD-10-CM

## 2016-09-30 DIAGNOSIS — Z7984 Long term (current) use of oral hypoglycemic drugs: Secondary | ICD-10-CM

## 2016-09-30 DIAGNOSIS — E1122 Type 2 diabetes mellitus with diabetic chronic kidney disease: Secondary | ICD-10-CM | POA: Diagnosis not present

## 2016-09-30 DIAGNOSIS — M545 Low back pain: Secondary | ICD-10-CM | POA: Diagnosis not present

## 2016-09-30 DIAGNOSIS — Z720 Tobacco use: Secondary | ICD-10-CM

## 2016-09-30 DIAGNOSIS — R351 Nocturia: Secondary | ICD-10-CM

## 2016-09-30 DIAGNOSIS — I10 Essential (primary) hypertension: Secondary | ICD-10-CM

## 2016-09-30 DIAGNOSIS — E11319 Type 2 diabetes mellitus with unspecified diabetic retinopathy without macular edema: Secondary | ICD-10-CM | POA: Diagnosis not present

## 2016-09-30 DIAGNOSIS — R350 Frequency of micturition: Secondary | ICD-10-CM | POA: Diagnosis not present

## 2016-09-30 DIAGNOSIS — E7849 Other hyperlipidemia: Secondary | ICD-10-CM

## 2016-09-30 DIAGNOSIS — Z9119 Patient's noncompliance with other medical treatment and regimen: Secondary | ICD-10-CM

## 2016-09-30 DIAGNOSIS — I129 Hypertensive chronic kidney disease with stage 1 through stage 4 chronic kidney disease, or unspecified chronic kidney disease: Secondary | ICD-10-CM | POA: Diagnosis not present

## 2016-09-30 DIAGNOSIS — N400 Enlarged prostate without lower urinary tract symptoms: Secondary | ICD-10-CM

## 2016-09-30 DIAGNOSIS — D631 Anemia in chronic kidney disease: Secondary | ICD-10-CM

## 2016-09-30 DIAGNOSIS — R3912 Poor urinary stream: Secondary | ICD-10-CM

## 2016-09-30 DIAGNOSIS — Z2821 Immunization not carried out because of patient refusal: Secondary | ICD-10-CM

## 2016-09-30 DIAGNOSIS — G8929 Other chronic pain: Secondary | ICD-10-CM | POA: Diagnosis not present

## 2016-09-30 DIAGNOSIS — Z79899 Other long term (current) drug therapy: Secondary | ICD-10-CM

## 2016-09-30 HISTORY — DX: Benign prostatic hyperplasia without lower urinary tract symptoms: N40.0

## 2016-09-30 LAB — POCT GLYCOSYLATED HEMOGLOBIN (HGB A1C): Hemoglobin A1C: 6.4

## 2016-09-30 LAB — GLUCOSE, CAPILLARY: Glucose-Capillary: 258 mg/dL — ABNORMAL HIGH (ref 65–99)

## 2016-09-30 MED ORDER — TERAZOSIN HCL 2 MG PO CAPS: 2.0000 mg | ORAL_CAPSULE | Freq: Every day | ORAL | 3 refills | Status: DC

## 2016-09-30 NOTE — Progress Notes (Signed)
   Subjective:    Patient ID: Ross Bauer, male    DOB: 19-Oct-1945, 71 y.o.   MRN: [id number]  HPI  XAIVIER Bauer is here for diabetes with stage 4 chronic kidney disease, anemia of chronic kidney disease, hyperlipidemia, hypertension, tobacco abuse, chronic low back pain, and BPH with noctruia. Please see the A&P for the status of the pt's chronic medical problems.  Review of Systems  Constitutional: Negative for activity change, appetite change and unexpected weight change.  Respiratory: Negative for chest tightness, shortness of breath and wheezing.   Cardiovascular: Negative for chest pain, palpitations and leg swelling.  Gastrointestinal: Negative for abdominal distention, abdominal pain, constipation, diarrhea, nausea and vomiting.  Genitourinary: Positive for difficulty urinating and frequency.       Nocturia Q2 hours  Musculoskeletal: Positive for back pain. Negative for arthralgias and joint swelling.  Skin: Negative for rash and wound.      Objective:   Physical Exam  Constitutional: He is oriented to person, place, and time. He appears well-developed and well-nourished. No distress.  HENT:  Head: Normocephalic and atraumatic.  Eyes: Conjunctivae are normal. Right eye exhibits no discharge. Left eye exhibits no discharge. No scleral icterus.  Cardiovascular: Normal rate, regular rhythm and normal heart sounds.  Exam reveals no gallop and no friction rub.   No murmur heard. Pulmonary/Chest: Effort normal and breath sounds normal. No respiratory distress. He has no wheezes. He has no rales.  Abdominal: Soft. Bowel sounds are normal. He exhibits no distension. There is no tenderness. There is no rebound and no guarding.  Musculoskeletal: Normal range of motion. He exhibits no edema, tenderness or deformity.  Neurological: He is alert and oriented to person, place, and time. He exhibits normal muscle tone.  Skin: Skin is warm and dry. No rash noted. He is not diaphoretic.  No erythema.  Psychiatric: He has a normal mood and affect. His behavior is normal. Judgment and thought content normal.  Nursing note and vitals reviewed.     Assessment & Plan:   Please see problem oriented charting.

## 2016-09-30 NOTE — Assessment & Plan Note (Signed)
Assessment  His blood pressure was slightly elevated today at 142/50. This is on amlodipine 10 mg by mouth daily, carvedilol 25 mg by mouth twice daily, and as needed clonidine 0.1 mg every 12 hours. He has stopped taking his Lasix and this may be the cause of the slight increase in blood pressure given his chronic renal insufficiency.  Plan  We will continue the amlodipine 10 mg by mouth daily and carvedilol 25 mg by mouth twice daily. We have started terazosin 2 mg by mouth every night. He requires this peripheral alpha blocker for his BPH. We will reassess his blood pressure control on this regimen at the follow-up visit.

## 2016-09-30 NOTE — Assessment & Plan Note (Signed)
Assessment  His stage IV chronic kidney disease is stable per the most recent creatinine.  Plan  We are continuing aggressive risk factor modification including management of his diabetes, hypertension, and hyperlipidemia. He is scheduled to follow-up with [name] of nephrology in 2 months.

## 2016-09-30 NOTE — Assessment & Plan Note (Signed)
Assessment  He is tolerating the atorvastatin 80 mg by mouth daily without myalgias.  Plan  We will continue with a high intensity statin and assess for evidence of intolerances at the follow-up visit.

## 2016-09-30 NOTE — Assessment & Plan Note (Signed)
Assessment  He continues to smoke although is slowly working through the pre-contemplative stage. Although he's not ready to quit yet he may be at the follow-up visit.  Plan  We spent at least 3 minutes of the visit talking about the importance of smoking cessation given his multiple comorbidities. He was asked to contact me in the future should he change his mind and wants support in an attempt to quit smoking. We will reassess his willingness to quit smoking at the follow-up visit.

## 2016-09-30 NOTE — Assessment & Plan Note (Signed)
Assessment  His diabetes is well controlled with a hemoglobin A1c of 6.4. This is on glipizide 10 mg by mouth twice daily. He has completely cut out [employer] sweet tea and this is made all the difference. In May the creatinine was 3.11, estimated GFR was 22, and a PTH was 59.  Plan  We will continue the glipizide 10 mg by mouth twice daily. He was praised on his excellent diabetic control with the changes that he made in his diet and was encouraged to continue to avoid [employer] sweet tea. The importance of following up with Dr. Dione Booze for his diabetic retinopathy was stressed, and he stated he would schedule an eye appointment. A urine microalbumin was obtained during the visit and is pending at the time of this dictation. He is otherwise up-to-date on his diabetic health care maintenance. He has follow-up scheduled with [name] of nephrology in 2 months.

## 2016-09-30 NOTE — Patient Instructions (Signed)
It was good to see you aain.  You are doing an outstanding job with your diabetes.  Keep up the great work.  1) Keep taking all of the medications as you are.  2) I started terazosin 2 mg by mouth every night.  This should decrease the number of times you wake up at night.  If you are doing well with this medication and think you need a higher dose to improve your urination at night you can double the dose to 2 tablets (4 mg) at night.  The side effect to watch for is dizziness.  That is why you should take it at night.  This medication will also help your blood pressure.  3) Schedule a follow-up appointment with Dr. Dione Booze for your eyes.  This is really important.  4) Let me know when you are ready to quit smoking so I can provide you with some support.  I am glad you are thinking about it.  I will see you back in 6 months, sooner if necessary.

## 2016-09-30 NOTE — Assessment & Plan Note (Signed)
He continues to be un interested in any preventative health care. This includes refusing the flu shot, Pneumovax, tetanus booster, colonoscopy, or hepatitis C screening. We will reassess if he changes his mind about healthcare maintenance at the follow-up visit.

## 2016-09-30 NOTE — Assessment & Plan Note (Signed)
Assessment  He notes nocturia every 2 hours associated with urgency and a decreased strength of stream. This is therefore likely secondary to prostatic hypertrophy. This led him to quit the Lasix therapy.  Plan  He refuses a rectal exam and PSA to assess for possible malignancy as a cause of his symptoms even though we discussed this in detail. We will start him on trazosin 2 mg by mouth at night. He was warned of the orthostatic hypotension associated with this medication. If his symptoms improve slightly, but he felt more improvement was in the offering he was asked to self titrate up the terazosin to 4 mg by mouth nightly. We will reassess the efficacy of this peripheral alpha blocker in his nocturia symptom control at the follow-up visit.

## 2016-09-30 NOTE — Assessment & Plan Note (Signed)
Assessment  In May had a hematocrit of 29. He is asymptomatic at this time.  Plan  We will continue to periodically follow CBCs to assess for any changes in his hemoglobin and hematocrit. We will also reassess for symptoms consistent with a symptomatic anemia at the follow-up visit.

## 2016-09-30 NOTE — Assessment & Plan Note (Signed)
Assessment  He continues to have chronic back pain which is reasonably well controlled with as needed Tylenol. He is not interested in any stronger medication at this time.  Plan  We will continue the as needed Tylenol for his chronic low back pain and reassess the efficacy of this therapy at the follow-up visit.

## 2016-10-01 LAB — MICROALBUMIN / CREATININE URINE RATIO
Creatinine, Urine: 107.1 mg/dL
Microalb/Creat Ratio: 821.2 mg/g creat — ABNORMAL HIGH (ref 0.0–30.0)
Microalbumin, Urine: 879.5 ug/mL

## 2016-10-18 NOTE — Progress Notes (Signed)
Patient ID: Ross Bauer, male   DOB: 05-08-1945, 71 y.o.   MRN: [id number]  Urine microalbumin/creatinine ratio 821 which is a marked elevation from prior.  This suggests worsening in his renal function.  When asked about his previous reaction to ACEI he was not sure what it was.  Review of the chart notes he was on this medication as recently as 2015 and that it was stopped because he simply ran out.  At that time (07/2013) there was no mention of an adverse reaction.  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  He plans on following up with his nephrologist in the near future, but given his stage IV chronic kidney disease I will defer the issue of restarting his ACEI to him given the potential acute negative consequences to his kidney function, and unclear benefits at this late stage.

## 2016-11-15 ENCOUNTER — Other Ambulatory Visit: Payer: Self-pay | Admitting: Internal Medicine

## 2016-11-15 DIAGNOSIS — I10 Essential (primary) hypertension: Secondary | ICD-10-CM

## 2016-12-10 DIAGNOSIS — J189 Pneumonia, unspecified organism: Secondary | ICD-10-CM

## 2016-12-10 HISTORY — DX: Pneumonia, unspecified organism: J18.9

## 2017-01-02 ENCOUNTER — Encounter (HOSPITAL_COMMUNITY): Payer: Self-pay | Admitting: General Practice

## 2017-01-02 ENCOUNTER — Inpatient Hospital Stay (HOSPITAL_COMMUNITY)
Admission: EM | Admit: 2017-01-02 | Discharge: 2017-01-16 | DRG: 871 | Disposition: A | Discharge status: Skilled Nursing Facility | Payer: Medicare HMO | Attending: Oncology | Admitting: Oncology

## 2017-01-02 ENCOUNTER — Other Ambulatory Visit: Payer: Self-pay

## 2017-01-02 ENCOUNTER — Emergency Department (HOSPITAL_COMMUNITY): Payer: Medicare HMO

## 2017-01-02 DIAGNOSIS — K922 Gastrointestinal hemorrhage, unspecified: Secondary | ICD-10-CM | POA: Diagnosis not present

## 2017-01-02 DIAGNOSIS — J9621 Acute and chronic respiratory failure with hypoxia: Secondary | ICD-10-CM | POA: Diagnosis present

## 2017-01-02 DIAGNOSIS — R001 Bradycardia, unspecified: Secondary | ICD-10-CM | POA: Diagnosis present

## 2017-01-02 DIAGNOSIS — N390 Urinary tract infection, site not specified: Secondary | ICD-10-CM

## 2017-01-02 DIAGNOSIS — E1122 Type 2 diabetes mellitus with diabetic chronic kidney disease: Secondary | ICD-10-CM | POA: Diagnosis present

## 2017-01-02 DIAGNOSIS — R739 Hyperglycemia, unspecified: Secondary | ICD-10-CM

## 2017-01-02 DIAGNOSIS — N401 Enlarged prostate with lower urinary tract symptoms: Secondary | ICD-10-CM

## 2017-01-02 DIAGNOSIS — E43 Unspecified severe protein-calorie malnutrition: Secondary | ICD-10-CM | POA: Diagnosis present

## 2017-01-02 DIAGNOSIS — E1100 Type 2 diabetes mellitus with hyperosmolarity without nonketotic hyperglycemic-hyperosmolar coma (NKHHC): Secondary | ICD-10-CM | POA: Diagnosis not present

## 2017-01-02 DIAGNOSIS — R05 Cough: Secondary | ICD-10-CM | POA: Diagnosis not present

## 2017-01-02 DIAGNOSIS — K31819 Angiodysplasia of stomach and duodenum without bleeding: Secondary | ICD-10-CM

## 2017-01-02 DIAGNOSIS — N189 Chronic kidney disease, unspecified: Secondary | ICD-10-CM

## 2017-01-02 DIAGNOSIS — E8779 Other fluid overload: Secondary | ICD-10-CM | POA: Diagnosis not present

## 2017-01-02 DIAGNOSIS — D62 Acute posthemorrhagic anemia: Secondary | ICD-10-CM | POA: Diagnosis not present

## 2017-01-02 DIAGNOSIS — R1013 Epigastric pain: Secondary | ICD-10-CM | POA: Diagnosis not present

## 2017-01-02 DIAGNOSIS — D500 Iron deficiency anemia secondary to blood loss (chronic): Secondary | ICD-10-CM

## 2017-01-02 DIAGNOSIS — R1310 Dysphagia, unspecified: Secondary | ICD-10-CM | POA: Diagnosis present

## 2017-01-02 DIAGNOSIS — Z7189 Other specified counseling: Secondary | ICD-10-CM | POA: Diagnosis not present

## 2017-01-02 DIAGNOSIS — Z888 Allergy status to other drugs, medicaments and biological substances status: Secondary | ICD-10-CM

## 2017-01-02 DIAGNOSIS — M6250 Muscle wasting and atrophy, not elsewhere classified, unspecified site: Secondary | ICD-10-CM | POA: Diagnosis not present

## 2017-01-02 DIAGNOSIS — R059 Cough, unspecified: Secondary | ICD-10-CM

## 2017-01-02 DIAGNOSIS — R571 Hypovolemic shock: Secondary | ICD-10-CM | POA: Diagnosis not present

## 2017-01-02 DIAGNOSIS — G9341 Metabolic encephalopathy: Secondary | ICD-10-CM | POA: Diagnosis not present

## 2017-01-02 DIAGNOSIS — E872 Acidosis: Secondary | ICD-10-CM | POA: Diagnosis not present

## 2017-01-02 DIAGNOSIS — E1365 Other specified diabetes mellitus with hyperglycemia: Secondary | ICD-10-CM | POA: Diagnosis not present

## 2017-01-02 DIAGNOSIS — R578 Other shock: Secondary | ICD-10-CM | POA: Diagnosis not present

## 2017-01-02 DIAGNOSIS — E1165 Type 2 diabetes mellitus with hyperglycemia: Secondary | ICD-10-CM | POA: Diagnosis not present

## 2017-01-02 DIAGNOSIS — I714 Abdominal aortic aneurysm, without rupture: Secondary | ICD-10-CM | POA: Diagnosis not present

## 2017-01-02 DIAGNOSIS — E1101 Type 2 diabetes mellitus with hyperosmolarity with coma: Secondary | ICD-10-CM | POA: Diagnosis not present

## 2017-01-02 DIAGNOSIS — N400 Enlarged prostate without lower urinary tract symptoms: Secondary | ICD-10-CM | POA: Diagnosis present

## 2017-01-02 DIAGNOSIS — R41841 Cognitive communication deficit: Secondary | ICD-10-CM | POA: Diagnosis not present

## 2017-01-02 DIAGNOSIS — E860 Dehydration: Secondary | ICD-10-CM

## 2017-01-02 DIAGNOSIS — R0602 Shortness of breath: Secondary | ICD-10-CM

## 2017-01-02 DIAGNOSIS — E785 Hyperlipidemia, unspecified: Secondary | ICD-10-CM

## 2017-01-02 DIAGNOSIS — F17210 Nicotine dependence, cigarettes, uncomplicated: Secondary | ICD-10-CM | POA: Diagnosis present

## 2017-01-02 DIAGNOSIS — A419 Sepsis, unspecified organism: Secondary | ICD-10-CM | POA: Diagnosis not present

## 2017-01-02 DIAGNOSIS — B9689 Other specified bacterial agents as the cause of diseases classified elsewhere: Secondary | ICD-10-CM | POA: Diagnosis not present

## 2017-01-02 DIAGNOSIS — N3000 Acute cystitis without hematuria: Secondary | ICD-10-CM | POA: Diagnosis not present

## 2017-01-02 DIAGNOSIS — E11319 Type 2 diabetes mellitus with unspecified diabetic retinopathy without macular edema: Secondary | ICD-10-CM | POA: Diagnosis present

## 2017-01-02 DIAGNOSIS — I120 Hypertensive chronic kidney disease with stage 5 chronic kidney disease or end stage renal disease: Secondary | ICD-10-CM | POA: Diagnosis present

## 2017-01-02 DIAGNOSIS — J449 Chronic obstructive pulmonary disease, unspecified: Secondary | ICD-10-CM | POA: Diagnosis not present

## 2017-01-02 DIAGNOSIS — I129 Hypertensive chronic kidney disease with stage 1 through stage 4 chronic kidney disease, or unspecified chronic kidney disease: Secondary | ICD-10-CM | POA: Diagnosis not present

## 2017-01-02 DIAGNOSIS — R092 Respiratory arrest: Secondary | ICD-10-CM

## 2017-01-02 DIAGNOSIS — R64 Cachexia: Secondary | ICD-10-CM | POA: Diagnosis present

## 2017-01-02 DIAGNOSIS — J440 Chronic obstructive pulmonary disease with acute lower respiratory infection: Secondary | ICD-10-CM | POA: Diagnosis present

## 2017-01-02 DIAGNOSIS — N179 Acute kidney failure, unspecified: Secondary | ICD-10-CM

## 2017-01-02 DIAGNOSIS — Z79899 Other long term (current) drug therapy: Secondary | ICD-10-CM | POA: Diagnosis not present

## 2017-01-02 DIAGNOSIS — D649 Anemia, unspecified: Secondary | ICD-10-CM | POA: Diagnosis not present

## 2017-01-02 DIAGNOSIS — R195 Other fecal abnormalities: Secondary | ICD-10-CM | POA: Diagnosis not present

## 2017-01-02 DIAGNOSIS — K08109 Complete loss of teeth, unspecified cause, unspecified class: Secondary | ICD-10-CM | POA: Diagnosis not present

## 2017-01-02 DIAGNOSIS — D72829 Elevated white blood cell count, unspecified: Secondary | ICD-10-CM | POA: Diagnosis not present

## 2017-01-02 DIAGNOSIS — N185 Chronic kidney disease, stage 5: Secondary | ICD-10-CM | POA: Diagnosis not present

## 2017-01-02 DIAGNOSIS — E1159 Type 2 diabetes mellitus with other circulatory complications: Secondary | ICD-10-CM | POA: Diagnosis present

## 2017-01-02 DIAGNOSIS — R350 Frequency of micturition: Secondary | ICD-10-CM

## 2017-01-02 DIAGNOSIS — N186 End stage renal disease: Secondary | ICD-10-CM | POA: Diagnosis present

## 2017-01-02 DIAGNOSIS — D631 Anemia in chronic kidney disease: Secondary | ICD-10-CM | POA: Diagnosis not present

## 2017-01-02 DIAGNOSIS — E875 Hyperkalemia: Secondary | ICD-10-CM | POA: Diagnosis present

## 2017-01-02 DIAGNOSIS — G8929 Other chronic pain: Secondary | ICD-10-CM

## 2017-01-02 DIAGNOSIS — J9601 Acute respiratory failure with hypoxia: Secondary | ICD-10-CM

## 2017-01-02 DIAGNOSIS — R1311 Dysphagia, oral phase: Secondary | ICD-10-CM | POA: Diagnosis present

## 2017-01-02 DIAGNOSIS — R402441 Other coma, without documented Glasgow coma scale score, or with partial score reported, in the field [EMT or ambulance]: Secondary | ICD-10-CM | POA: Diagnosis not present

## 2017-01-02 DIAGNOSIS — R1312 Dysphagia, oropharyngeal phase: Secondary | ICD-10-CM | POA: Diagnosis not present

## 2017-01-02 DIAGNOSIS — I6789 Other cerebrovascular disease: Secondary | ICD-10-CM | POA: Diagnosis not present

## 2017-01-02 DIAGNOSIS — R579 Shock, unspecified: Secondary | ICD-10-CM

## 2017-01-02 DIAGNOSIS — I462 Cardiac arrest due to underlying cardiac condition: Secondary | ICD-10-CM | POA: Diagnosis not present

## 2017-01-02 DIAGNOSIS — J441 Chronic obstructive pulmonary disease with (acute) exacerbation: Secondary | ICD-10-CM | POA: Diagnosis not present

## 2017-01-02 DIAGNOSIS — J189 Pneumonia, unspecified organism: Secondary | ICD-10-CM | POA: Diagnosis present

## 2017-01-02 DIAGNOSIS — N184 Chronic kidney disease, stage 4 (severe): Secondary | ICD-10-CM | POA: Diagnosis not present

## 2017-01-02 DIAGNOSIS — E8809 Other disorders of plasma-protein metabolism, not elsewhere classified: Secondary | ICD-10-CM | POA: Diagnosis not present

## 2017-01-02 DIAGNOSIS — M549 Dorsalgia, unspecified: Secondary | ICD-10-CM

## 2017-01-02 DIAGNOSIS — M545 Low back pain: Secondary | ICD-10-CM | POA: Diagnosis not present

## 2017-01-02 DIAGNOSIS — M6281 Muscle weakness (generalized): Secondary | ICD-10-CM | POA: Diagnosis not present

## 2017-01-02 DIAGNOSIS — Z6824 Body mass index (BMI) 24.0-24.9, adult: Secondary | ICD-10-CM

## 2017-01-02 DIAGNOSIS — R509 Fever, unspecified: Secondary | ICD-10-CM | POA: Diagnosis not present

## 2017-01-02 DIAGNOSIS — K31811 Angiodysplasia of stomach and duodenum with bleeding: Secondary | ICD-10-CM | POA: Diagnosis not present

## 2017-01-02 DIAGNOSIS — N289 Disorder of kidney and ureter, unspecified: Secondary | ICD-10-CM | POA: Diagnosis not present

## 2017-01-02 DIAGNOSIS — R4182 Altered mental status, unspecified: Secondary | ICD-10-CM | POA: Diagnosis not present

## 2017-01-02 DIAGNOSIS — R140 Abdominal distension (gaseous): Secondary | ICD-10-CM | POA: Diagnosis not present

## 2017-01-02 DIAGNOSIS — K5520 Angiodysplasia of colon without hemorrhage: Secondary | ICD-10-CM | POA: Diagnosis present

## 2017-01-02 DIAGNOSIS — J90 Pleural effusion, not elsewhere classified: Secondary | ICD-10-CM | POA: Diagnosis not present

## 2017-01-02 HISTORY — DX: Dorsalgia, unspecified: M54.9

## 2017-01-02 HISTORY — DX: Pneumonia, unspecified organism: J18.9

## 2017-01-02 HISTORY — DX: Erectile dysfunction due to diseases classified elsewhere: N52.1

## 2017-01-02 HISTORY — DX: Other chronic pain: G89.29

## 2017-01-02 HISTORY — DX: Type 2 diabetes mellitus with other specified complication: E11.69

## 2017-01-02 LAB — CBG MONITORING, ED
GLUCOSE-CAPILLARY: 568 mg/dL — AB (ref 65–99)
GLUCOSE-CAPILLARY: 334 mg/dL — AB (ref 65–99)
GLUCOSE-CAPILLARY: 189 mg/dL — AB (ref 65–99)
GLUCOSE-CAPILLARY: 168 mg/dL — AB (ref 65–99)
Glucose-Capillary: >600 mg/dL (ref 65–99)
Glucose-Capillary: >600 mg/dL (ref 65–99)
Glucose-Capillary: 468 mg/dL — ABNORMAL HIGH (ref 65–99)
Glucose-Capillary: 246 mg/dL — ABNORMAL HIGH (ref 65–99)
Glucose-Capillary: 128 mg/dL — ABNORMAL HIGH (ref 65–99)
Glucose-Capillary: 132 mg/dL — ABNORMAL HIGH (ref 65–99)

## 2017-01-02 LAB — GLUCOSE, CAPILLARY
GLUCOSE-CAPILLARY: 200 mg/dL — AB (ref 65–99)
Glucose-Capillary: 217 mg/dL — ABNORMAL HIGH (ref 65–99)
Glucose-Capillary: 333 mg/dL — ABNORMAL HIGH (ref 65–99)

## 2017-01-02 LAB — BASIC METABOLIC PANEL
ANION GAP: 13 (ref 5–15)
ANION GAP: 10 (ref 5–15)
ANION GAP: 8 (ref 5–15)
ANION GAP: 11 (ref 5–15)
Anion gap: 14 (ref 5–15)
BUN: 127 mg/dL — ABNORMAL HIGH (ref 6–20)
BUN: 115 mg/dL — AB (ref 6–20)
BUN: 100 mg/dL — ABNORMAL HIGH (ref 6–20)
BUN: 99 mg/dL — ABNORMAL HIGH (ref 6–20)
BUN: 103 mg/dL — ABNORMAL HIGH (ref 6–20)
CALCIUM: 8.1 mg/dL — AB (ref 8.9–10.3)
CHLORIDE: 98 mmol/L — AB (ref 101–111)
CHLORIDE: 111 mmol/L (ref 101–111)
CHLORIDE: 119 mmol/L — AB (ref 101–111)
CHLORIDE: 117 mmol/L — AB (ref 101–111)
CO2: 17 mmol/L — ABNORMAL LOW (ref 22–32)
CO2: 14 mmol/L — ABNORMAL LOW (ref 22–32)
CO2: 14 mmol/L — ABNORMAL LOW (ref 22–32)
CO2: 17 mmol/L — ABNORMAL LOW (ref 22–32)
CO2: 11 mmol/L — AB (ref 22–32)
CREATININE: 5.99 mg/dL — AB (ref 0.61–1.24)
CREATININE: 4.61 mg/dL — AB (ref 0.61–1.24)
Calcium: 8.1 mg/dL — ABNORMAL LOW (ref 8.9–10.3)
Calcium: 7.9 mg/dL — ABNORMAL LOW (ref 8.9–10.3)
Calcium: 8.1 mg/dL — ABNORMAL LOW (ref 8.9–10.3)
Calcium: 8 mg/dL — ABNORMAL LOW (ref 8.9–10.3)
Chloride: 120 mmol/L — ABNORMAL HIGH (ref 101–111)
Creatinine, Ser: 5.09 mg/dL — ABNORMAL HIGH (ref 0.61–1.24)
Creatinine, Ser: 4.4 mg/dL — ABNORMAL HIGH (ref 0.61–1.24)
Creatinine, Ser: 4.51 mg/dL — ABNORMAL HIGH (ref 0.61–1.24)
GFR calc Af Amer: 14 mL/min — ABNORMAL LOW (ref >60)
GFR calc Af Amer: 13 mL/min — ABNORMAL LOW (ref >60)
GFR calc non Af Amer: 12 mL/min — ABNORMAL LOW (ref >60)
GFR calc non Af Amer: 11 mL/min — ABNORMAL LOW (ref >60)
GFR, EST AFRICAN AMERICAN: 10 mL/min — AB (ref >60)
GFR, EST AFRICAN AMERICAN: 12 mL/min — AB (ref >60)
GFR, EST AFRICAN AMERICAN: 14 mL/min — AB (ref >60)
GFR, EST NON AFRICAN AMERICAN: 8 mL/min — AB (ref >60)
GFR, EST NON AFRICAN AMERICAN: 10 mL/min — AB (ref >60)
GFR, EST NON AFRICAN AMERICAN: 12 mL/min — AB (ref >60)
GLUCOSE: 296 mg/dL — AB (ref 65–99)
Glucose, Bld: 1035 mg/dL (ref 65–99)
Glucose, Bld: 532 mg/dL (ref 65–99)
Glucose, Bld: 140 mg/dL — ABNORMAL HIGH (ref 65–99)
Glucose, Bld: 222 mg/dL — ABNORMAL HIGH (ref 65–99)
POTASSIUM: 4.9 mmol/L (ref 3.5–5.1)
POTASSIUM: 4.5 mmol/L (ref 3.5–5.1)
POTASSIUM: 4.9 mmol/L (ref 3.5–5.1)
Potassium: 6.6 mmol/L (ref 3.5–5.1)
Potassium: 5.7 mmol/L — ABNORMAL HIGH (ref 3.5–5.1)
SODIUM: 129 mmol/L — AB (ref 135–145)
SODIUM: 138 mmol/L (ref 135–145)
SODIUM: 143 mmol/L (ref 135–145)
SODIUM: 142 mmol/L (ref 135–145)
Sodium: 142 mmol/L (ref 135–145)

## 2017-01-02 LAB — I-STAT VENOUS BLOOD GAS, ED
ACID-BASE DEFICIT: 12 mmol/L — AB (ref 0.0–2.0)
BICARBONATE: 15.3 mmol/L — AB (ref 20.0–28.0)
O2 SAT: 26 %
PH VEN: 7.19 — AB (ref 7.250–7.430)
PO2 VEN: 22 mmHg — AB (ref 32.0–45.0)
TCO2: 17 mmol/L — ABNORMAL LOW (ref 22–32)
pCO2, Ven: 40.1 mmHg — ABNORMAL LOW (ref 44.0–60.0)

## 2017-01-02 LAB — URINALYSIS, ROUTINE W REFLEX MICROSCOPIC
BILIRUBIN URINE: NEGATIVE
KETONES UR: 5 mg/dL — AB
NITRITE: NEGATIVE
PH: 5 (ref 5.0–8.0)
Protein, ur: NEGATIVE mg/dL
SPECIFIC GRAVITY, URINE: 1.01 (ref 1.005–1.030)

## 2017-01-02 LAB — I-STAT CHEM 8, ED
BUN: 106 mg/dL — ABNORMAL HIGH (ref 6–20)
CALCIUM ION: 1.06 mmol/L — AB (ref 1.15–1.40)
CREATININE: 6 mg/dL — AB (ref 0.61–1.24)
Chloride: 100 mmol/L — ABNORMAL LOW (ref 101–111)
HCT: 26 % — ABNORMAL LOW (ref 39.0–52.0)
HEMOGLOBIN: 8.8 g/dL — AB (ref 13.0–17.0)
Potassium: 6.6 mmol/L (ref 3.5–5.1)
SODIUM: 132 mmol/L — AB (ref 135–145)
TCO2: 18 mmol/L — ABNORMAL LOW (ref 22–32)

## 2017-01-02 LAB — CBC
HCT: 28.1 % — ABNORMAL LOW (ref 39.0–52.0)
Hemoglobin: 8.6 g/dL — ABNORMAL LOW (ref 13.0–17.0)
MCH: 29.2 pg (ref 26.0–34.0)
MCHC: 30.6 g/dL (ref 30.0–36.0)
MCV: 95.3 fL (ref 78.0–100.0)
PLATELETS: 353 10*3/uL (ref 150–400)
RBC: 2.95 MIL/uL — AB (ref 4.22–5.81)
RDW: 14.1 % (ref 11.5–15.5)
WBC: 7.1 10*3/uL (ref 4.0–10.5)

## 2017-01-02 LAB — IRON AND TIBC
IRON: 35 ug/dL — AB (ref 45–182)
SATURATION RATIOS: 22 % (ref 17.9–39.5)
TIBC: 157 ug/dL — AB (ref 250–450)
UIBC: 122 ug/dL

## 2017-01-02 LAB — NA AND K (SODIUM & POTASSIUM), RAND UR
Potassium Urine: 14 mmol/L
SODIUM UR: 59 mmol/L

## 2017-01-02 LAB — HEPATIC FUNCTION PANEL
ALK PHOS: 116 U/L (ref 38–126)
ALT: 34 U/L (ref 17–63)
AST: 29 U/L (ref 15–41)
Albumin: 1.8 g/dL — ABNORMAL LOW (ref 3.5–5.0)
BILIRUBIN DIRECT: 0.2 mg/dL (ref 0.1–0.5)
BILIRUBIN INDIRECT: 0.8 mg/dL (ref 0.3–0.9)
BILIRUBIN TOTAL: 1 mg/dL (ref 0.3–1.2)
Total Protein: 7.9 g/dL (ref 6.5–8.1)

## 2017-01-02 LAB — FERRITIN: Ferritin: 593 ng/mL — ABNORMAL HIGH (ref 24–336)

## 2017-01-02 LAB — OSMOLALITY, URINE: Osmolality, Ur: 356 mOsm/kg (ref 300–900)

## 2017-01-02 LAB — INFLUENZA PANEL BY PCR (TYPE A & B)
Influenza A By PCR: NEGATIVE
Influenza B By PCR: NEGATIVE

## 2017-01-02 LAB — OSMOLALITY: Osmolality: 353 mOsm/kg (ref 275–295)

## 2017-01-02 LAB — PHOSPHORUS: PHOSPHORUS: 4 mg/dL (ref 2.5–4.6)

## 2017-01-02 LAB — PROCALCITONIN: Procalcitonin: 1.55 ng/mL

## 2017-01-02 LAB — MAGNESIUM: MAGNESIUM: 2.1 mg/dL (ref 1.7–2.4)

## 2017-01-02 MED ORDER — ONDANSETRON HCL 4 MG/2ML IJ SOLN
4.0000 mg | Freq: Four times a day (QID) | INTRAMUSCULAR | Status: DC | PRN
  Administered 2017-01-02 – 2017-01-06 (×3): 4 mg via INTRAVENOUS
  Filled 2017-01-02 (×3): qty 2

## 2017-01-02 MED ORDER — SODIUM CHLORIDE 0.9 % IV BOLUS (SEPSIS)
1000.0000 mL | Freq: Once | INTRAVENOUS | Status: AC
  Administered 2017-01-02: 1000 mL via INTRAVENOUS

## 2017-01-02 MED ORDER — HEPARIN SODIUM (PORCINE) 5000 UNIT/ML IJ SOLN
5000.0000 [IU] | Freq: Three times a day (TID) | INTRAMUSCULAR | Status: DC
  Administered 2017-01-02 – 2017-01-03 (×2): 5000 [IU] via SUBCUTANEOUS
  Filled 2017-01-02 (×2): qty 1

## 2017-01-02 MED ORDER — ONDANSETRON HCL 4 MG PO TABS
4.0000 mg | ORAL_TABLET | Freq: Four times a day (QID) | ORAL | Status: DC | PRN
  Administered 2017-01-08: 4 mg via ORAL
  Filled 2017-01-02: qty 1

## 2017-01-02 MED ORDER — ASPIRIN EC 81 MG PO TBEC
81.0000 mg | DELAYED_RELEASE_TABLET | Freq: Every day | ORAL | Status: DC
  Administered 2017-01-03 – 2017-01-04 (×2): 81 mg via ORAL
  Filled 2017-01-02 (×2): qty 1

## 2017-01-02 MED ORDER — DEXTROSE-NACL 5-0.45 % IV SOLN
INTRAVENOUS | Status: DC
  Administered 2017-01-02: 12:00:00 via INTRAVENOUS

## 2017-01-02 MED ORDER — CALCIUM GLUCONATE 10 % IV SOLN
1.0000 g | Freq: Once | INTRAVENOUS | Status: AC
  Administered 2017-01-02: 1 g via INTRAVENOUS
  Filled 2017-01-02: qty 10

## 2017-01-02 MED ORDER — ACETAMINOPHEN 325 MG PO TABS
650.0000 mg | ORAL_TABLET | Freq: Four times a day (QID) | ORAL | Status: DC | PRN
  Administered 2017-01-06 – 2017-01-14 (×9): 650 mg via ORAL
  Filled 2017-01-02 (×9): qty 2

## 2017-01-02 MED ORDER — SODIUM CHLORIDE 0.9 % IV SOLN
INTRAVENOUS | Status: DC
  Administered 2017-01-02: 07:00:00 via INTRAVENOUS

## 2017-01-02 MED ORDER — SODIUM CHLORIDE 0.9 % IV SOLN
INTRAVENOUS | Status: DC
  Administered 2017-01-02: 05:00:00 via INTRAVENOUS

## 2017-01-02 MED ORDER — INSULIN GLARGINE 100 UNIT/ML ~~LOC~~ SOLN
21.0000 [IU] | Freq: Every day | SUBCUTANEOUS | Status: DC
  Administered 2017-01-02 – 2017-01-03 (×2): 21 [IU] via SUBCUTANEOUS
  Filled 2017-01-02 (×2): qty 0.21

## 2017-01-02 MED ORDER — AZITHROMYCIN 500 MG IV SOLR
500.0000 mg | Freq: Once | INTRAVENOUS | Status: AC
  Administered 2017-01-02: 500 mg via INTRAVENOUS
  Filled 2017-01-02: qty 500

## 2017-01-02 MED ORDER — DEXTROSE 5 % IV SOLN
250.0000 mg | INTRAVENOUS | Status: DC
  Administered 2017-01-03: 250 mg via INTRAVENOUS
  Filled 2017-01-02 (×2): qty 250

## 2017-01-02 MED ORDER — DEXTROSE 5 % IV SOLN
1.0000 g | INTRAVENOUS | Status: DC
  Administered 2017-01-03: 1 g via INTRAVENOUS
  Filled 2017-01-02: qty 10

## 2017-01-02 MED ORDER — ATORVASTATIN CALCIUM 80 MG PO TABS
80.0000 mg | ORAL_TABLET | Freq: Every day | ORAL | Status: DC
  Administered 2017-01-03 – 2017-01-15 (×13): 80 mg via ORAL
  Filled 2017-01-02 (×14): qty 1

## 2017-01-02 MED ORDER — ACETAMINOPHEN 650 MG RE SUPP
650.0000 mg | Freq: Four times a day (QID) | RECTAL | Status: DC | PRN
Start: 2017-01-02 — End: 2017-01-16

## 2017-01-02 MED ORDER — SODIUM CHLORIDE 0.9 % IV SOLN
INTRAVENOUS | Status: DC
  Administered 2017-01-02: 12.2 [IU]/h via INTRAVENOUS
  Administered 2017-01-02: 5.4 [IU]/h via INTRAVENOUS
  Filled 2017-01-02: qty 1

## 2017-01-02 MED ORDER — DEXTROSE 5 % IV SOLN
1.0000 g | Freq: Once | INTRAVENOUS | Status: AC
  Administered 2017-01-02: 1 g via INTRAVENOUS
  Filled 2017-01-02: qty 10

## 2017-01-02 MED ORDER — ALBUTEROL SULFATE (2.5 MG/3ML) 0.083% IN NEBU
10.0000 mg | INHALATION_SOLUTION | Freq: Once | RESPIRATORY_TRACT | Status: AC
  Administered 2017-01-02: 10 mg via RESPIRATORY_TRACT
  Filled 2017-01-02: qty 12

## 2017-01-02 MED ORDER — SODIUM CHLORIDE 0.9 % IV SOLN
INTRAVENOUS | Status: AC
  Administered 2017-01-02: 07:00:00 via INTRAVENOUS

## 2017-01-02 MED ORDER — INSULIN ASPART 100 UNIT/ML ~~LOC~~ SOLN
10.0000 [IU] | Freq: Once | SUBCUTANEOUS | Status: AC
  Administered 2017-01-02: 10 [IU] via INTRAVENOUS
  Filled 2017-01-02: qty 1

## 2017-01-02 MED ORDER — INSULIN ASPART 100 UNIT/ML ~~LOC~~ SOLN: 0.0000 [IU] | Freq: Three times a day (TID) | SUBCUTANEOUS | Status: DC

## 2017-01-02 MED ORDER — INSULIN ASPART 100 UNIT/ML IV SOLN: 10.0000 [IU] | Freq: Once | INTRAVENOUS | Status: DC

## 2017-01-02 NOTE — ED Notes (Signed)
Pt comes in from home with altered mental status and hyperglycemia.

## 2017-01-02 NOTE — ED Notes (Signed)
CBG taken at 14:53 was 168.

## 2017-01-02 NOTE — ED Notes (Signed)
CBG taken at 13:51 was 189.   Pt appears somewhat agitated--was attempting to pull blankets off of himself. May be due to the room being very warm. Did not respond when asked by both tech and daughter.

## 2017-01-02 NOTE — ED Notes (Signed)
Attempted report 

## 2017-01-02 NOTE — ED Provider Notes (Addendum)
TIME SEEN: 3:51 AM  CHIEF COMPLAINT: Hyperglycemia  HPI: Patient is a 71 year old male with history of hypertension, hyperlipidemia, non-insulin-dependent diabetes, chronic kidney disease who presents to the emergency department after his daughter called EMS with concerns for confusion and hyperglycemia.  Blood glucose found to be "high" with EMS.  Patient states that his daughter has not been giving his medications for the past week because he has not been eating.  When I asked him why he has not been eating he states "because I did not feel like it".  He denies any fevers.  Has had a dry cough.  Did have some vomiting tonight.  No diarrhea.  No chest pain or shortness of breath.  No headache or head injury.  Family states that they are concerned that he was confused over the past 2 weeks.  He denies numbness, tingling or focal weakness.  [facility]  ROS: See HPI Constitutional: no fever  Eyes: no drainage  ENT: no runny nose   Cardiovascular:  no chest pain  Resp: no SOB  GI:  vomiting GU: no dysuria Integumentary: no rash  Allergy: no hives  Musculoskeletal: no leg swelling  Neurological: no slurred speech ROS otherwise negative  PAST MEDICAL HISTORY/PAST SURGICAL HISTORY:  Past Medical History:  Diagnosis Date  . Anemia in chronic kidney disease 06/19/2009  . Background diabetic retinopathy associated with type 2 diabetes mellitus (HCC) 11/13/2013   Bilateral. Followed by ophthalmologist Dr. Ernesto Rutherfordobert Groat.  [name]. BPH (benign prostatic hyperplasia) 09/30/2016  . Chronic low back pain 05/09/2007  . Essential hypertension   . Hyperlipidemia   . Onychomycosis of right great toe 06/01/2015  . 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 Lowell GuitarPowell.  . Tobacco abuse 03/20/2007  . Type 2 diabetes mellitus with stage 4 chronic kidney disease (HCC)   . 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 Authorizing Provider  amLODipine (NORVASC) 10 MG tablet Take 1 tablet (10 mg total) daily by mouth. 11/16/16   Doneen PoissonKlima, Lawrence, MD  aspirin 81 MG tablet Take 1 tablet (81 mg total) by mouth daily. 04/30/13   Emokpae, Ejiroghene E, MD  atorvastatin (LIPITOR) 80 MG tablet Take 1 tablet (80 mg total) by mouth daily at 6 PM. 03/04/16   Doneen PoissonKlima, Lawrence, MD  carvedilol (COREG) 25 MG tablet Take 1 tablet (25 mg total) by mouth 2 (two) times daily with a meal. 12/09/15   Doneen PoissonKlima, Lawrence, MD  cloNIDine (CATAPRES) 0.1 MG tablet Take 1 tablet (0.1 mg total) by mouth every 12 (twelve) hours as needed. 03/04/16   Doneen PoissonKlima, Lawrence, MD  furosemide (LASIX) 40 MG tablet Take 1 tablet (40 mg total) by mouth daily. Patient not taking: Reported on 09/30/2016 05/29/15   Courtney ParisJones, Eden W, MD  glipiZIDE (GLUCOTROL) 10 MG tablet Take 1 tablet (10 mg total) by mouth 2 (two) times daily before a meal. 03/04/16   Doneen PoissonKlima, Lawrence, MD  terazosin (HYTRIN) 2 MG capsule Take 1 capsule (2 mg total) by mouth at bedtime. 09/30/16   Doneen PoissonKlima, Lawrence, MD    ALLERGIES:  Allergies  Allergen Reactions  . Ace Inhibitors     REACTION: intolerance    SOCIAL HISTORY:  Social History   Tobacco Use  . Smoking status: Current Every Day Smoker    Packs/day:  1.50    Years: 50.00    Pack years: 75.00    Types: Cigarettes  . Smokeless tobacco: Never Used  Substance Use Topics  . Alcohol use: No    Alcohol/week: 0.0 oz    FAMILY HISTORY: Family History  Problem Relation Age of Onset  . Cancer Mother        died in 8770s.  . Cancer Father        died in 4870s  . Healthy Sister   . Healthy Brother   . Unexplained death Brother   . Healthy Daughter   . Healthy Daughter     EXAM: BP (!) 149/53   Pulse 88   Temp (!) 97.5 F (36.4 C) (Oral)   Resp (!) 24   SpO2 91%  CONSTITUTIONAL: Alert and  oriented to person, place and situation.  States that he thinks it is 2017.  Responds appropriately to questions.  Elderly.  Thin.  Chronically ill-appearing. HEAD: Normocephalic EYES: Conjunctivae clear, pupils appear equal, EOMI ENT: normal nose; moist mucous membranes NECK: Supple, no meningismus, no nuchal rigidity, no LAD  CARD: RRR; S1 and S2 appreciated; no murmurs, no clicks, no rubs, no gallops RESP: Normal chest excursion without splinting, mildly tachypneic but no clue small respirations; breath sounds clear and equal bilaterally; no wheezes, no rhonchi, no rales, no hypoxia or respiratory distress, speaking full sentences ABD/GI: Normal bowel sounds; non-distended; soft, non-tender, no rebound, no guarding, no peritoneal signs, no hepatosplenomegaly BACK:  The back appears normal and is non-tender to palpation, there is no CVA tenderness EXT: Normal ROM in all joints; non-tender to palpation; no edema; normal capillary refill; no cyanosis, no calf tenderness or swelling    SKIN: Normal color for age and race; warm; no rash NEURO: Moves all extremities equally, no pronator drift, normal sensation diffusely, cranial nerves II through XII intact, normal speech PSYCH: The patient's mood and manner are appropriate. Grooming and personal hygiene are appropriate.  MEDICAL DECISION MAKING: Patient here with hyperglycemia.  I am concerned for DKA.  He is tachypneic but no acute small respirations and mentating normally.  EKG shows peaked T waves anteriorly.  No chest pain or shortness of breath.  I do not think this is ACS but I am concerned for hyperkalemia.  Will start IV fluids and IV insulin.  Patient will likely need admission.  Blood glucose here is greater than 600.  ED PROGRESS: Patient's labs show a glucose of 1035 with a bicarb of 17 but normal anion gap.  Potassium is 6.6.  Given EKG changes patient was bolused with IV fluids, IV insulin.  Also given IV calcium and albuterol.  Insulin  drip also started for concerns for DKA.  Will discuss with internal medicine for admission.  5:32 AM Discussed patient's case with IM resident, Dr. Nelson ChimesAmin.  I have recommended admission and patient (and family if present) agree with this plan. Admitting physician will place admission orders.   I reviewed all nursing notes, vitals, pertinent previous records, EKGs, lab and urine results, imaging (as available).    CRITICAL CARE Performed by: Rochele RaringKristen Breann Losano   Total critical care time: 45 minutes  Critical care time was exclusive of separately billable procedures and treating other patients.  Critical care was necessary to treat or prevent imminent or life-threatening deterioration.  Critical care was time spent personally by me on the following activities: development of treatment plan with patient and/or surrogate as well as nursing, discussions with consultants, evaluation of patient's response  to treatment, examination of patient, obtaining history from patient or surrogate, ordering and performing treatments and interventions, ordering and review of laboratory studies, ordering and review of radiographic studies, pulse oximetry and re-evaluation of patient's condition.    Patient appears to have increasing tachypnea.  It does appear that he has multifocal pneumonia.  Will obtain blood cultures.  Polarization.  Will give ceftriaxone and azithromycin.  Will update admission team.   EKG Interpretation  Date/Time:  Monday January 02 2017 03:39:47 EST Ventricular Rate:  91 PR Interval:    QRS Duration: 99 QT Interval:  383 QTC Calculation: 472 R Axis:   34 Text Interpretation:  Sinus rhythm Probable left atrial enlargement Minimal ST elevation, anterior leads Peaked T waves  No old tracing to compare Confirmed by Charrise Lardner, Baxter Hire [phone]) on 01/02/2017 4:27:19 AM         Tatsuya Okray, Layla Maw, DO 01/02/17 0533    Colbi Staubs, Layla Maw, DO 01/02/17 [phone]

## 2017-01-02 NOTE — Progress Notes (Addendum)
Subjective: [name] was seen resting in his emergency department bed this morning. He appeared tired and frail. He endorse recent difficulty swallowing. His daughter reports he has had very poor PO intake for about a week and has been weakened and confused. They state he is doing a little better from when he intially presented to the ED.  Objective:  Vital signs in last 24 hours: Vitals:   01/02/17 1145 01/02/17 1246 01/02/17 1313 01/02/17 1415  BP: 126/80 (!) 131/97 (!) 131/97 (!) 127/53  Pulse: (!) 103 (!) 108 100 (!) 105  Resp: (!) 22 (!) 28 15 16   Temp: 98.6 F (37 C)     TempSrc: Oral     SpO2: 93% 94% 95% 90%   Physical Exam  Constitutional:  Thin, Frail Elderly man  HENT:  Head: Atraumatic.  Mildly dry mucous membranes Temporal wasting  Eyes: EOM are normal. Scleral icterus is present.  Cardiovascular: Normal rate, regular rhythm, normal heart sounds and intact distal pulses.  Pulmonary/Chest: Effort normal. No respiratory distress.  Bibasilar crackles  Abdominal: Soft. Bowel sounds are normal. He exhibits no distension. There is no tenderness.  Musculoskeletal: He exhibits no edema or deformity.  Neurological: He is alert.  Skin: Skin is warm and dry.  Mild skin tenting    Assessment/Plan:  Active Problems:   Type 2 diabetes mellitus with hyperosmolar nonketotic hyperglycemia (HCC)  Multi-focal Pneumonia: Presented with increased cough, tachypnea, oxygen requirement in the setting of recent viral illness. Afebrile, no Leukocytosis. CXR concerning for multi-focal pneumonia. Considering his dysphagia and multi-focal opacities, concern is for aspiration pneumonia. Will expand coverage if patient spikes a fever. Procalcitonin 1.55. Flu panel negative. - Monitor vital signs, fever curve  - Ceftriaxone, Azithromycin - Blood cultures - Influenza panel - Supplemental O2 prn  Hyperglycemia, H/o Type 2 DM: Blood glucose >1000 on presentation in the setting of not  taking home medications due to decreased PO intake. His anion gap is normal and given the degree of hyperglycemia and hx of type 2 DM, this is likely more of an HHS picture rather than DKA. Correction with insulin gtt and IVF for total fluid deficit has been initiated in the ED. - CBG monitoring - Lantus on BG < 200 - Insulin gtt, discontinue 2 hrs after Lantus is given - Serum osm, urine osm - IVF- NS until bg <250, then D5 1/2NS    Acute on Chronic Kidney Disease (CKD IV), Hyperkalemia History of CKD stage IV and has established with Nephrology, baseline Cr ~3.2 per scanned records. He has had acute worsening of renal function with Cr to 6.0, BUN 106; likely a result of marked dehydration d/t HHS above. His K was also elevated to 6.6, received temporizing measures including Ca, albuterol, and insulin. Will monitor serial BMPs and depending on his overall response to tx, will consider Nephrology consult should he need HD during this admission. Phosphorous 4.0 (WNL). - Cardiac monitoring - BMP q4h - I/O's  Dysphagia: Daughter reports concern for dysphagia, with decreased PO intake and difficulty swallowing over the past week. She reports coughing shortly after eating food, and his multi-focal pneumonia is also concerning. His neurologic exam is without focal deficits, doubt acute neurologic etiology of dysphagia. - NPO - Speech eval   Acute on Chronic Anemia Pt presenting with Hgb of 8.8, was previously around 11.5 and attributed to anemia of chronic disease and renal disease, though on further review of scanned lab results with nephrology this may be a more  gradual decline. And there are no reported signs/symptoms of bleeding.  - CBC in AM - Fe studies    Urine Leukocytes U/A noted moderate leukocytes, negative nitrites, few bacteria on micro. No complaints of dysuria at this point. The ceftriaxone he is receiving should cover for urinary pathogens if he does have a UTI in addition to his  pna. Urine culture collected prior to antibiotic administration. - F/u Urine Culture      H/o Hypertension: Currently normotensive., last BP 127/53. On Amlodipine, Clonidine, Carvedilol, and Furosemide at home. will hold home medications for BP.   - Hold home anti-hypertensives   Dispo: Anticipated discharge in approximately 2-3 day(s).   Ross Bauer, Alexander, MD 01/02/2017, 3:16 PM Pager: [phone]

## 2017-01-02 NOTE — ED Notes (Addendum)
Anion gap 10, BG 132. Message sent to provider to DC. Insulin drip.

## 2017-01-02 NOTE — ED Triage Notes (Signed)
Pt BIB [facility] for altered mental status/failure to thrive. Pt has been altered for around 1-2 weeks. No meds for approx a week. A+Ox1. Lives with family

## 2017-01-02 NOTE — Progress Notes (Addendum)
Paged at 3:30 about patient having rapid shallow breathing with RR in the 30s-40s, Saturations of 90% on 5L, Soft BP, and family concern for shaking. Patient was seen at beside.  Physical Exam  Cardiovascular: Normal rate, regular rhythm, normal heart sounds and intact distal pulses.  Pulmonary/Chest:  Basilar Rales Increased work of breathing  Abdominal: Soft. Bowel sounds are normal. He exhibits no distension. There is no tenderness.  Musculoskeletal: He exhibits no edema or deformity.  Neurological: He is alert.  Skin: Skin is warm and dry.   At time of examination patient BP had improved to 120s/70s. Upon arousal the patient's RR improved to the 20s and saturations were 95% on 4L. When patient closed his eyes and began to nap his RR increase to 30s, but saturations remain in the 90s. Patient denies pain, worsening shortness of breath, or other complaints at this time. Physical exam unchanged from this morning.  Family asked what was being done for his pneumonia and it was explained that he was being treated with antibiotics. Family aslo inquired about timing of SLP eval. - No interventions at this time - Will continue to monitor - Nursing staff are attempting to expedite SLP eval  [name], DO IM PGY-1

## 2017-01-02 NOTE — ED Notes (Signed)
CBG taken at 12:45 was 132.

## 2017-01-02 NOTE — ED Notes (Signed)
Pt with deep cough- suction hooked up for patient.

## 2017-01-02 NOTE — ED Notes (Signed)
1ST SEE OF BC collected.

## 2017-01-02 NOTE — H&P (Signed)
Date: 01/02/2017               Patient Name:  Ross Bauer MRN: [id number]  DOB: 09/24/1945 Age / Sex: 71 y.o., male   PCP: Doneen PoissonKlima, Lawrence, MD         Medical Service: Internal Medicine Teaching Service         Attending Physician: Dr. Raelyn NumberWard, Kristen N, DO    First Contact: Dr. Alinda MoneyMelvin Pager: [phone]  Second Contact: Dr. Obie DredgeBlum Pager: [phone]       After Hours (After 5p/  First Contact Pager: [phone]  weekends / holidays): Second Contact Pager: [phone]   Chief Complaint: Altered Mental Status  History of Present Illness: Ross Bauer is a 71 yo M with a past medical history of CKD IV, DM, HTN, HLD, chronic anemia, BPH who presented to the ED with complaints of altered mental status. History was obtained with assistance from daughter and chart review.   The daughter states around 2 weeks ago the patient experienced an upper respiratory infection with cough, runny nose.  His runny nose resolved though his cough has persisted and is more frequent with production compared to his baseline chronic cough related to smoking.  Sputum has been white/yellow. He also had a fall around 2 weeks ago which the daughter believes may have been related to balance issues that he occasionally has with his chronic back pain. Starting around 1 week ago, the patient has had progressive decrease in p.o. intake and has not taken his medications for the last 3-4 days as a result. He has also had generalized weakness and decreased mobility from his baseline and has appeared confused per his daughter. He has not complained of any abdominal pain, diarrhea, nausea but is seeming to have difficulty with swallowing per the daughter.  He started spitting pured food back up and coughs shortly after eating though not during.  He has also had frequent urination up until last night when his urinary output began decreasing, he has not complained of some dysuria.  The daughter is unsure if he has had dark stools or other  changes in bowel movements, states her son (who the patient lives with) may be able to clarify.  In the ED, 97.5, HR 87, BP 172/62, 98% on 2 L [place]. Labs showed bg >1000, Na 129 (corrected 144), K 6.6, CO2 17, BUN 106, and Cr 5.99, Hgb 8.6. EKG had peaked T weaves. VBG pH 7.19. U/A with leukocytes, negative nitrite, glucosuria and small ketones. He was given 2 L IVF bolus followed by infusion, insulin gtt, also provided Ca and albuterol to temporize K. He was admitted for further management.   Meds:  No outpatient medications have been marked as taking for the 01/02/17 encounter [facility](Hospital Encounter).     Allergies: Allergies as of 01/02/2017 - Review Complete 01/02/2017  Allergen Reaction Noted  . Ace inhibitors     Past Medical History:  Diagnosis Date  . Anemia in chronic kidney disease 06/19/2009  . Background diabetic retinopathy associated with type 2 diabetes mellitus (HCC) 11/13/2013   Bilateral. Followed by ophthalmologist Dr. Ernesto Rutherfordobert Groat.  [name]. BPH (benign prostatic hyperplasia) 09/30/2016  . Chronic low back pain 05/09/2007  . Essential hypertension   . Hyperlipidemia   . Onychomycosis of right great toe 06/01/2015  . 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 Lowell GuitarPowell.  . Tobacco abuse 03/20/2007  . Type 2 diabetes mellitus with stage  4 chronic kidney disease (HCC)   . 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 Cancer Mother        died in 23s.  . Cancer Father        died in 60s  . Healthy Sister   . Healthy Brother   . Unexplained death Brother   . Healthy Daughter   . Healthy Daughter      Social History:  Social History   Tobacco Use  . Smoking status: Current Every Day Smoker    Packs/day: 1.50    Years: 50.00    Pack years: 75.00    Types: Cigarettes  . Smokeless tobacco: Never Used    Substance Use Topics  . Alcohol use: No    Alcohol/week: 0.0 oz  . Drug use: No     Review of Systems: A complete ROS was negative except as per HPI.    Physical Exam: Blood pressure (!) 140/59, pulse 99, temperature (!) 97.5 F (36.4 C), temperature source Oral, resp. rate (!) 21, SpO2 95 %. General: Frail elderly male resting in bed Head: Normocephalic, atraumatic Eyes: EOMI with some nystagmus ENT: Dry mucus membranes with some food material, no pharyngeal exudate CV: Faint heart sounds, no murmur appreciated  Resp: Tachypnea and increased work of breathing, no wheezing  Abd: Soft, +BS, non-tender to palpation  Extr: No LE edema, muscle atrophy Neuro: Alert and oriented x3, CN intact with no facial droop, tongue midline. 5/5 bilateral upper extremity strength, symmetric 3-4/5 bilateral LE strength  Skin: Warm, dry    EKG: personally reviewed my interpretation is normal sinus rhythm with artifact, peaked T waves most prominent in V3, V4, no ischemic changes appreciated.   CXR: personally reviewed my interpretation is prominent opacity to RLL, also with opacity to L lower lung, no pleural effusions, prominence of R fissure. Concern for multi-focal pna per radiology.   Assessment & Plan by Problem:  Multi-focal Pneumonia Pt presenting with increased cough, tachypnea, oxygen requirement in the setting of recent viral illness. He is afebrile, though his initial temp was low at 97.5, no WBC. CXR concerning for multi-focal pneumonia. The reports of dysphagia and multi-focal opacities are concerning for aspiration pneumonia and further coverage should be considered, speech eval is pending. Will also check an influenza panel to assess if this may be a post-influenza pneumonia.   --Monitor vital signs, fever curve  --Start Ceftriaxone, Azithromycin --Procalcitonin  --Blood cultures --Influenza panel --Supplemental O2 prn --Consider rpt PA/Lateral CXR if pt able  Hyperglycemia, H/o  Type 2 DM Pt presented with markedly elevated blood glucose >1000 in the setting of not taking home medications due to decreased PO intake. His anion gap is normal and given the degree of hyperglycemia and hx of type 2 DM, this is likely more of an HHS picture rather than DKA. Correction with insulin gtt and IVF for total fluid deficit has been initiated in the ED.   --CBG monitoring --Insulin gtt --Serum osm, urine osm --IVF- NS until bg <250, then D5 1/2NS    Acute on Chronic Kidney Disease (CKD IV), Hyperkalemia Pt previously CKD stage IV and has established with Nephrology, baseline Cr ~3.2 per scanned records. He has had acute worsening of renal function with Cr to 6.0, BUN 106; likely a result of marked dehydration d/t HHS above. His K was also elevated to 6.6, received temporizing measures including  Ca, albuterol, and insulin. Will rpt BMP and depending on his overall response to tx, would consider Nephrology consult should he need HD during this admission.    --Tele --Rpt BMP,Phos  --I/Os    Dysphagia Daughter reports concern for dysphagia, with decreased PO intake and perceived difficulty swallowing over the past week since sx onset. She reports coughing shortly after eating food, and his multi-focal pneumonia is certainly concerning. His neurologic exam is without focal deficits, doubt an acute neurologic event has precipitated dysphagia. --NPO --Speech eval   Acute on Chronic Anemia Pt presenting with Hgb of 8.8, was previously around 11.5 and attributed to anemia of chronic disease and renal disease, though on further review of scanned lab results with nephrology this may be a more gradual decline. And there are no reported signs/symptoms of bleeding.  --Rpt CBC --Fe studies    Urine Leukocytes U/A noted moderate leukocytes, negative nitrites, few bacteria on micro. No complaints of dysuria at this point. The ceftriaxone he is receiving should cover for urinary pathogens if he  does have a UTI in addition to his pna. Urine culture collected prior to antibiotic administration. --F/u Urine Culture      H/o Hypertension Pt is currently normotensive, last BP 119/50 and pt has ongoing infection, will hold home medications for BP.   --Hold home anti-hypertensives      Dispo: Admit patient to Inpatient with expected length of stay greater than 2 midnights.  Signed: Ginger CarneHarden, Maurion Walkowiak, MD 01/02/2017, 5:47 AM  Pager: [phone]

## 2017-01-02 NOTE — Progress Notes (Signed)
Pharmacy Antibiotic Note  Ross Bauer is a 71 y.o. male admitted on 01/02/2017 with pneumonia.  Pharmacy has been consulted for ceftriaxone dosing. Multi-focal opacities on CXR, concern for aspiration pneumonia.  Procal 1.55, negative flu PCR, afebrile, and WBC wnl.    Plan: Ceftriaxone 1 gram q 24 h  Monitor LOT, clinical progression, cultures     Temp (24hrs), Avg:98.7 F (37.1 C), Min:97.5 F (36.4 C), Max:100.1 F (37.8 C)  Recent Labs  Lab 01/02/17 0343 01/02/17 0357 01/02/17 0655 01/02/17 1200 01/02/17 1829  WBC 7.1  --   --   --   --   CREATININE 5.99* 6.00* 5.09* 4.40* 4.51*    CrCl cannot be calculated (Unknown ideal weight.).    Allergies  Allergen Reactions  . Ace Inhibitors     REACTION: intolerance    Antimicrobials this admission: Ceftriaxone 12/24>> Azithromycin 12/24>>  Dose adjustments this admission: n/a  Microbiology results: 12/24 BCx >> sent 12/24 UCx>> sent  Ross Bauer, PharmD Pharmacy Resident Pager #: [phone] 01/02/2017 7:37 PM

## 2017-01-02 NOTE — ED Notes (Signed)
Per Provider Insulin drip to be d/ced 2 hours after subQ lantus received. D51/2NS dced once patient is not NPO and cleared by SLP.

## 2017-01-02 NOTE — ED Notes (Signed)
CBG taken at 11:35 was 128.

## 2017-01-02 NOTE — ED Notes (Signed)
[facility]

## 2017-01-03 DIAGNOSIS — R509 Fever, unspecified: Secondary | ICD-10-CM

## 2017-01-03 DIAGNOSIS — R05 Cough: Secondary | ICD-10-CM

## 2017-01-03 DIAGNOSIS — R059 Cough, unspecified: Secondary | ICD-10-CM

## 2017-01-03 DIAGNOSIS — Z79899 Other long term (current) drug therapy: Secondary | ICD-10-CM

## 2017-01-03 DIAGNOSIS — N390 Urinary tract infection, site not specified: Secondary | ICD-10-CM

## 2017-01-03 LAB — BASIC METABOLIC PANEL
ANION GAP: 9 (ref 5–15)
ANION GAP: 9 (ref 5–15)
BUN: 96 mg/dL — ABNORMAL HIGH (ref 6–20)
BUN: 96 mg/dL — ABNORMAL HIGH (ref 6–20)
CALCIUM: 8.1 mg/dL — AB (ref 8.9–10.3)
CALCIUM: 8.1 mg/dL — AB (ref 8.9–10.3)
CO2: 15 mmol/L — ABNORMAL LOW (ref 22–32)
CO2: 16 mmol/L — AB (ref 22–32)
Chloride: 117 mmol/L — ABNORMAL HIGH (ref 101–111)
Chloride: 118 mmol/L — ABNORMAL HIGH (ref 101–111)
Creatinine, Ser: 4.52 mg/dL — ABNORMAL HIGH (ref 0.61–1.24)
Creatinine, Ser: 4.54 mg/dL — ABNORMAL HIGH (ref 0.61–1.24)
GFR, EST AFRICAN AMERICAN: 14 mL/min — AB (ref >60)
GFR, EST AFRICAN AMERICAN: 14 mL/min — AB (ref >60)
GFR, EST NON AFRICAN AMERICAN: 12 mL/min — AB (ref >60)
GFR, EST NON AFRICAN AMERICAN: 12 mL/min — AB (ref >60)
Glucose, Bld: 353 mg/dL — ABNORMAL HIGH (ref 65–99)
Glucose, Bld: 354 mg/dL — ABNORMAL HIGH (ref 65–99)
POTASSIUM: 5 mmol/L (ref 3.5–5.1)
POTASSIUM: 4.9 mmol/L (ref 3.5–5.1)
Sodium: 142 mmol/L (ref 135–145)
Sodium: 142 mmol/L (ref 135–145)

## 2017-01-03 LAB — CBC
HCT: 26 % — ABNORMAL LOW (ref 39.0–52.0)
HEMOGLOBIN: 8.1 g/dL — AB (ref 13.0–17.0)
MCH: 28.3 pg (ref 26.0–34.0)
MCHC: 31.2 g/dL (ref 30.0–36.0)
MCV: 90.9 fL (ref 78.0–100.0)
Platelets: 275 10*3/uL (ref 150–400)
RBC: 2.86 MIL/uL — AB (ref 4.22–5.81)
RDW: 13.7 % (ref 11.5–15.5)
WBC: 9.7 10*3/uL (ref 4.0–10.5)

## 2017-01-03 LAB — GLUCOSE, CAPILLARY
GLUCOSE-CAPILLARY: 88 mg/dL (ref 65–99)
Glucose-Capillary: 304 mg/dL — ABNORMAL HIGH (ref 65–99)
Glucose-Capillary: 228 mg/dL — ABNORMAL HIGH (ref 65–99)
Glucose-Capillary: 546 mg/dL (ref 65–99)
Glucose-Capillary: 131 mg/dL — ABNORMAL HIGH (ref 65–99)

## 2017-01-03 MED ORDER — INSULIN GLARGINE 100 UNIT/ML ~~LOC~~ SOLN
15.0000 [IU] | Freq: Every day | SUBCUTANEOUS | Status: DC
  Administered 2017-01-03: 15 [IU] via SUBCUTANEOUS
  Filled 2017-01-03 (×2): qty 0.15

## 2017-01-03 MED ORDER — INSULIN ASPART 100 UNIT/ML ~~LOC~~ SOLN
4.0000 [IU] | Freq: Three times a day (TID) | SUBCUTANEOUS | Status: DC
  Administered 2017-01-03 – 2017-01-04 (×5): 4 [IU] via SUBCUTANEOUS

## 2017-01-03 MED ORDER — DOCUSATE SODIUM 100 MG PO CAPS
100.0000 mg | ORAL_CAPSULE | Freq: Two times a day (BID) | ORAL | Status: DC
  Administered 2017-01-03 – 2017-01-04 (×4): 100 mg via ORAL
  Filled 2017-01-03 (×7): qty 1

## 2017-01-03 MED ORDER — SODIUM CHLORIDE 0.9 % IV SOLN
INTRAVENOUS | Status: AC
  Administered 2017-01-03: 03:00:00 via INTRAVENOUS

## 2017-01-03 MED ORDER — AMPICILLIN-SULBACTAM SODIUM 1.5 (1-0.5) G IJ SOLR
1.5000 g | Freq: Two times a day (BID) | INTRAMUSCULAR | Status: DC
  Administered 2017-01-03 (×2): 1.5 g via INTRAVENOUS
  Filled 2017-01-03 (×3): qty 1.5

## 2017-01-03 MED ORDER — SODIUM CHLORIDE 0.9 % IV SOLN
INTRAVENOUS | Status: AC
  Administered 2017-01-03: 12:00:00 via INTRAVENOUS

## 2017-01-03 MED ORDER — INSULIN ASPART 100 UNIT/ML ~~LOC~~ SOLN
0.0000 [IU] | SUBCUTANEOUS | Status: DC
  Administered 2017-01-03: 3 [IU] via SUBCUTANEOUS

## 2017-01-03 MED ORDER — INSULIN ASPART 100 UNIT/ML ~~LOC~~ SOLN
0.0000 [IU] | Freq: Three times a day (TID) | SUBCUTANEOUS | Status: DC
  Administered 2017-01-03: 11 [IU] via SUBCUTANEOUS
  Administered 2017-01-03: 2 [IU] via SUBCUTANEOUS
  Administered 2017-01-04: 8 [IU] via SUBCUTANEOUS
  Administered 2017-01-04: 3 [IU] via SUBCUTANEOUS
  Administered 2017-01-04: 8 [IU] via SUBCUTANEOUS

## 2017-01-03 MED ORDER — POLYETHYLENE GLYCOL 3350 17 G PO PACK
17.0000 g | PACK | Freq: Every day | ORAL | Status: DC
  Administered 2017-01-04: 17 g via ORAL
  Filled 2017-01-03 (×2): qty 1

## 2017-01-03 MED ORDER — INSULIN ASPART 100 UNIT/ML ~~LOC~~ SOLN
7.0000 [IU] | Freq: Once | SUBCUTANEOUS | Status: AC
  Administered 2017-01-03: 7 [IU] via SUBCUTANEOUS

## 2017-01-03 MED ORDER — INSULIN ASPART 100 UNIT/ML ~~LOC~~ SOLN: 0.0000 [IU] | Freq: Every day | SUBCUTANEOUS | Status: DC

## 2017-01-03 NOTE — Progress Notes (Signed)
  Date: 01/03/2017  Patient name: Ross Bauer  Medical record number: [id number]  Date of birth: 02/19/1945   I have seen and evaluated this patient and I have discussed the plan of care with the house staff. Please see Dr. Vesta MixerMelvin's note for complete details. I concur with his findings.    Inez CatalinaMullen, Chrystopher Stangl B, MD 01/03/2017, 2:38 PM

## 2017-01-03 NOTE — Progress Notes (Signed)
Subjective: [name] was seen resting comfortably in his bed this morning. He states he is feeling better today and appeared more alert than yesterday. He states he is not in any pain and his breathing has improved. He asked about getting his speech evaluation to see if he could be cleared to eat and drink.  Objective:  Vital signs in last 24 hours: Vitals:   01/02/17 1525 01/02/17 2000 01/03/17 0000 01/03/17 0314  BP: (!) 92/46 (!) 131/56 (!) 158/59 (!) 142/62  Pulse: 99 96 97 97  Resp: (!) 35 (!) 23 (!) 30 (!) 27  Temp: 100.1 F (37.8 C) (!) 97.2 F (36.2 C) 99 F (37.2 C) (!) 100.9 F (38.3 C)  TempSrc: Oral Oral Axillary Oral  SpO2: 93% 95% 96% 94%   Physical Exam  Constitutional: He is oriented to person, place, and time.  Thin, Elderly male  HENT:  Mouth/Throat: Oropharynx is clear and moist.  Temporal wasting  Eyes: EOM are normal. Right eye exhibits no discharge. Left eye exhibits no discharge.  Cardiovascular: Normal rate, regular rhythm, normal heart sounds and intact distal pulses.  Pulmonary/Chest:  Mild basilar crackles Mildly increased worth of breathing  Abdominal: Soft. Bowel sounds are normal. He exhibits no distension. There is no tenderness.  Musculoskeletal: He exhibits no edema or deformity.  Neurological: He is alert and oriented to person, place, and time.  Skin: Skin is warm and dry.  Psychiatric: He has a normal mood and affect.    Assessment/Plan:  Active Problems:   Type 2 diabetes mellitus with hyperosmolar nonketotic hyperglycemia (HCC)  Multi-focal Pneumonia: Patient presented with increased cough, tachypnea, oxygen requirement in the setting of recent viral illness. No Leukocytosis. CXR concerning for multi-focal pneumonia. Considering his dysphagia and multi-focal opacities, there was some concern for aspiration pneumonia. Patient did spike a fever of 100.9 and has been switched from ceftriaxone to Unasyn for anaerobic coverage.  Procalcitonin 1.55. Flu panel negative. - Today: Improving dyspnea and lung sounds - Blood cultures: pending - Monitor vital signs, fever curve  - Azithromycin 250mg  IV Daily - Unasyn 1.5g IV q6h - Ceftriaxone discontinued - Supplemental O2 prn  Acute on Chronic Kidney Disease (CKD IV), Hyperkalemia Hyperkalemia resolved and AKI improving with Cr 4.5. History of CKD IV; Established with Nephrology; Baseline Cr ~3.2. On admission Cr 6.0; due to marked dehydration 2/2 HHS and dysphagia. On admission K 6.6; received temporizing measures (albuterol and insulin). Resolved while on Insulin drip for HHS. Has been receiving serial BMPs and depending on his overall response to tx, will consider Nephrology consult should he need HD during this admission. Phosphorous 4.0 (WNL). - Cardiac monitoring - BMP in AM - I/O's  UTI: U/A noted moderate leukocytes, negative nitrites, few bacteria on micro. No complaints of dysuria. Ceftriaxone initially and now Unasyn should cover for urinary pathogens. Urine culture collected prior to antibiotic administration. - F/u Urine Culture: Gram (-) Rods, speciation pending - Unasyn as above  Dysphagia: Stable. Daughter reports concern for dysphagia, with decreased PO intake and difficulty swallowing over the past week. She reports coughing shortly after eating food, and his multi-focal pneumonia is also concerning. His neurologic exam was without focal deficits, doubt acute neurologic etiology of dysphagia. - Speech Rec's: Dysphagia 1 Diet / Thin; Modified Barium Swallow tomorrow  Hyperglycemia Type 2 DM: HHS resolved. Blucose >1000 on presentation in the setting of not taking home medications due to decreased PO intake. His anion gap was normal. With Hyperglycemia, type 2 DM,  and elevated Serum Osm 353; this is was determined to be HHS rather than DKA. Patient improved throughout the day on Insulin gtt and was transitioned to SubQ insulin as glucose came down. -  Patient now dys 1 diet - CBG monitoring - Lantus 15U qhs - Novolog 4 TID AC - SSI-M TID AC - QHS correction coverage  Acute on Chronic Anemia Stable. 8.1 today. Pt presenting with Hgb of 8.8, was previously around 11.5 and attributed to anemia of chronic disease and renal disease, though on further review of scanned lab results with nephrology this may be a more gradual decline. And there are no reported signs/symptoms of bleeding.  - Fe studies: Ferritin 593, Fe 35, TIBC 157, T.Saturation 22% - CBC in AM  H/o Hypertension: Currently normo-hypertensive, last BP 142/62. On Amlodipine, Clonidine, Carvedilol, and Furosemide at home. will hold home medications for BP.   - Hold home antihypertensives  Dispo: Anticipated discharge in approximately 1-2 day(s).   Beola CordMelvin, Alexander, MD 01/03/2017, 7:26 AM Pager: [phone]

## 2017-01-03 NOTE — Progress Notes (Signed)
Notify MD about increasing CBG from 168 to 333.  Pt is NPO. Orders received and implemented.

## 2017-01-03 NOTE — Evaluation (Signed)
Clinical/Bedside Swallow Evaluation Patient Details  Name: Ross Bauer MRN: [id number] Date of Birth: 11/27/1945  Today's Date: 01/03/2017 Time: SLP Start Time (ACUTE ONLY): 0950 SLP Stop Time (ACUTE ONLY): 1030 SLP Time Calculation (min) (ACUTE ONLY): 40 min  Past Medical History:  Past Medical History:  Diagnosis Date  . Anemia in chronic kidney disease 06/19/2009  . Background diabetic retinopathy associated with type 2 diabetes mellitus (HCC) 11/13/2013   Bilateral. Followed by ophthalmologist Dr. Ernesto Rutherfordobert Groat.  [name]. BPH (benign prostatic hyperplasia) 09/30/2016  . Chronic back pain 05/09/2007   "all over" (01/02/2017)  . Essential hypertension   . Hyperlipidemia   . Onychomycosis of right great toe 06/01/2015  . Pneumonia   . 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 Lowell GuitarPowell.  . Tobacco abuse 03/20/2007  . Type 2 diabetes mellitus with stage 4 chronic kidney disease (HCC)   . 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 NO PAST SURGERIES     HPI:  71 [facility] 2 DM with hypersomolar nonketotic hyperglycemia and found to have pna.  PMH + for DM, confusion, poor po intake, weakness, rapid response called with pt with RR 30s to 40s, 90s oxygen saturation on 5 liters oxygen.  Per family member in room, pt with poor intake prior to admit and episode of vertigo a few weeks ago.  Per daughter, pt refused to go to MD with vertigo episode.  He also had dysphagia prior to admit with symptoms including sensation of residuals in pharynx, oral holding and bringing food/drink up after intake.  Today pt is fully alert with clear voice. .     Assessment / Plan / Recommendation Clinical Impression  Pt presents with strong voice and cough fortunately.  He does not have focal cn deficits - but ? mild  lingual deviation to right upon protrusion to consume intake and ? preferential head turn to right.   Pt does have minimal oral holding of liquids likely due to cognitive issues.  No overt indication of aspiration/airway compromise with puree/thin.  Pt did have overt coughing and suspected aspiration of liquids from peaches during oral manipulation.  He required total cues to expectorate peaches that were poorly masticated.  Did not conduct 3 ounce water test as highly suspect pt would not pass it. Given pt lack of dentition, premorbid sensation of sudden onset of vertigo approximately 3 weeks ago and dysphagia - recommend dys1/thin diet with strict precautions.  [facility] due to his report of sudden onset of premorbid dysphagia.  Educated family and pt to findings/recommendations using teach back  - all in agreement to plan.   SLP Visit Diagnosis: Dysphagia, unspecified (R13.10)    Aspiration Risk  Mild aspiration risk    Diet Recommendation Dysphagia 1 (Puree);Thin liquid   Liquid Administration via: Cup;Straw Medication Administration: Whole meds with puree Supervision: Patient able to self feed Compensations: Minimize environmental distractions;Slow rate;Small sips/bites Postural Changes: Seated upright at 90 degrees;Remain upright for at least 30 minutes after po intake    Other  Recommendations Oral Care Recommendations: Oral care BID   Follow up Recommendations (tbd)      Frequency and Duration min 1 x/week  1 week       Prognosis Barriers to Reach Goals: Time post onset  Swallow Study   General Date of Onset: 01/03/17 HPI: 71 [facility] DM with hypersomolar nonketotic hyperglycemia and found to have pna.  PMH + for DM, confusion, poor po intake, weakness, rapid response called with pt with RR 30s to 40s, 90s oxygen saturation on 5 liters oxygen.  Per family member in room, pt with poor intake prior to admit and  episode of vertigo a few weeks ago.  Per daughter, pt refused to go to MD with vertigo episode.  He also had dysphagia prior to admit with symptoms including sensation of residuals in pharynx, oral holding and bringing food/drink up after intake.  Today pt is fully alert with clear voice. .   Type of Study: Bedside Swallow Evaluation Diet Prior to this Study: NPO Temperature Spikes Noted: Yes Respiratory Status: Nasal cannula(3) History of Recent Intubation: No Behavior/Cognition: Alert;Cooperative;Pleasant mood Oral Cavity Assessment: Within Functional Limits Oral Care Completed by SLP: Recent completion by staff Oral Cavity - Dentition: Edentulous Vision: Functional for self-feeding Self-Feeding Abilities: Able to feed self Patient Positioning: Upright in bed Baseline Vocal Quality: Normal Volitional Cough: Strong Volitional Swallow: Able to elicit    Oral/Motor/Sensory Function Overall Oral Motor/Sensory Function: Within functional limits(? mild lingual deviation to right upon protrusion)   Ice Chips Ice chips: Within functional limits Presentation: Spoon   Thin Liquid Thin Liquid: Impaired Presentation: Straw;Cup Oral Phase Functional Implications: Oral holding    Nectar Thick Nectar Thick Liquid: Not tested   Honey Thick Honey Thick Liquid: Not tested   Puree Puree: Within functional limits Presentation: Self Fed;Spoon   Solid   GO   Solid: Impaired Presentation: Self Fed;Spoon Oral Phase Impairments: Impaired mastication Other Comments: cough prior to initiation of swallow, suspect spillage of liquids from peaches into airway, pt required total cues to expectorate peaches        Chales AbrahamsKimball, Jagger Demonte Ann 01/03/2017,10:56 AM  Donavan Burnetamara Cayman Brogden, MS [facility] SLP [phone]

## 2017-01-04 ENCOUNTER — Inpatient Hospital Stay (HOSPITAL_COMMUNITY): Payer: Medicare HMO

## 2017-01-04 DIAGNOSIS — D62 Acute posthemorrhagic anemia: Secondary | ICD-10-CM

## 2017-01-04 DIAGNOSIS — R578 Other shock: Secondary | ICD-10-CM

## 2017-01-04 DIAGNOSIS — J9601 Acute respiratory failure with hypoxia: Secondary | ICD-10-CM

## 2017-01-04 DIAGNOSIS — B9689 Other specified bacterial agents as the cause of diseases classified elsewhere: Secondary | ICD-10-CM

## 2017-01-04 DIAGNOSIS — R092 Respiratory arrest: Secondary | ICD-10-CM

## 2017-01-04 LAB — BLOOD GAS, ARTERIAL
ACID-BASE DEFICIT: 13.4 mmol/L — AB (ref 0.0–2.0)
BICARBONATE: 12.7 mmol/L — AB (ref 20.0–28.0)
DRAWN BY: 441371
FIO2: 100
O2 CONTENT: 15 L/min
O2 SAT: 90.2 %
PATIENT TEMPERATURE: 98.6
PCO2 ART: 31.7 mmHg — AB (ref 32.0–48.0)
PO2 ART: 62.9 mmHg — AB (ref 83.0–108.0)
pH, Arterial: 7.227 — ABNORMAL LOW (ref 7.350–7.450)

## 2017-01-04 LAB — CBC
HCT: 22.8 % — ABNORMAL LOW (ref 39.0–52.0)
HEMATOCRIT: 16.4 % — AB (ref 39.0–52.0)
Hemoglobin: 7.3 g/dL — ABNORMAL LOW (ref 13.0–17.0)
Hemoglobin: 5.1 g/dL — CL (ref 13.0–17.0)
MCH: 29.4 pg (ref 26.0–34.0)
MCH: 29.5 pg (ref 26.0–34.0)
MCHC: 32 g/dL (ref 30.0–36.0)
MCHC: 31.1 g/dL (ref 30.0–36.0)
MCV: 91.9 fL (ref 78.0–100.0)
MCV: 94.8 fL (ref 78.0–100.0)
PLATELETS: 242 10*3/uL (ref 150–400)
PLATELETS: 221 10*3/uL (ref 150–400)
RBC: 2.48 MIL/uL — ABNORMAL LOW (ref 4.22–5.81)
RBC: 1.73 MIL/uL — ABNORMAL LOW (ref 4.22–5.81)
RDW: 14.3 % (ref 11.5–15.5)
RDW: 15.2 % (ref 11.5–15.5)
WBC: 12 10*3/uL — ABNORMAL HIGH (ref 4.0–10.5)
WBC: 9.8 10*3/uL (ref 4.0–10.5)

## 2017-01-04 LAB — HEMOGLOBIN A1C
Hgb A1c MFr Bld: 11.3 % — ABNORMAL HIGH (ref 4.8–5.6)
Mean Plasma Glucose: 278 mg/dL

## 2017-01-04 LAB — GLUCOSE, CAPILLARY
GLUCOSE-CAPILLARY: 259 mg/dL — AB (ref 65–99)
GLUCOSE-CAPILLARY: 203 mg/dL — AB (ref 65–99)
Glucose-Capillary: 178 mg/dL — ABNORMAL HIGH (ref 65–99)
Glucose-Capillary: 272 mg/dL — ABNORMAL HIGH (ref 65–99)
Glucose-Capillary: 178 mg/dL — ABNORMAL HIGH (ref 65–99)
Glucose-Capillary: 63 mg/dL — ABNORMAL LOW (ref 65–99)

## 2017-01-04 LAB — BASIC METABOLIC PANEL
Anion gap: 7 (ref 5–15)
BUN: 82 mg/dL — AB (ref 6–20)
CO2: 16 mmol/L — ABNORMAL LOW (ref 22–32)
CREATININE: 4.08 mg/dL — AB (ref 0.61–1.24)
Calcium: 7.5 mg/dL — ABNORMAL LOW (ref 8.9–10.3)
Chloride: 115 mmol/L — ABNORMAL HIGH (ref 101–111)
GFR calc Af Amer: 15 mL/min — ABNORMAL LOW (ref >60)
GFR, EST NON AFRICAN AMERICAN: 13 mL/min — AB (ref >60)
GLUCOSE: 175 mg/dL — AB (ref 65–99)
Potassium: 4 mmol/L (ref 3.5–5.1)
SODIUM: 138 mmol/L (ref 135–145)

## 2017-01-04 LAB — PREPARE RBC (CROSSMATCH)

## 2017-01-04 LAB — PROTIME-INR
INR: 1.27
PROTHROMBIN TIME: 15.8 s — AB (ref 11.4–15.2)

## 2017-01-04 LAB — LACTIC ACID, PLASMA: LACTIC ACID, VENOUS: 4.7 mmol/L — AB (ref 0.5–1.9)

## 2017-01-04 LAB — ABO/RH: ABO/RH(D): B POS

## 2017-01-04 MED ORDER — SODIUM CHLORIDE 0.9 % IV SOLN: Freq: Once | INTRAVENOUS | Status: DC

## 2017-01-04 MED ORDER — LEVOFLOXACIN 750 MG PO TABS
750.0000 mg | ORAL_TABLET | Freq: Once | ORAL | Status: AC
  Administered 2017-01-04: 750 mg via ORAL
  Filled 2017-01-04: qty 1

## 2017-01-04 MED ORDER — DEXTROSE 5 % IV SOLN
0.0000 ug/min | INTRAVENOUS | Status: DC
  Administered 2017-01-04: 2 ug/min via INTRAVENOUS
  Filled 2017-01-04: qty 4

## 2017-01-04 MED ORDER — AMLODIPINE BESYLATE 10 MG PO TABS
10.0000 mg | ORAL_TABLET | Freq: Every day | ORAL | Status: DC
  Administered 2017-01-04: 10 mg via ORAL
  Filled 2017-01-04: qty 1

## 2017-01-04 MED ORDER — PIPERACILLIN-TAZOBACTAM IN DEX 2-0.25 GM/50ML IV SOLN
2.2500 g | Freq: Four times a day (QID) | INTRAVENOUS | Status: DC
  Administered 2017-01-04 – 2017-01-05 (×3): 2.25 g via INTRAVENOUS
  Filled 2017-01-04 (×4): qty 50

## 2017-01-04 MED ORDER — CARVEDILOL 25 MG PO TABS
25.0000 mg | ORAL_TABLET | Freq: Two times a day (BID) | ORAL | Status: DC
  Administered 2017-01-04: 25 mg via ORAL
  Filled 2017-01-04: qty 1

## 2017-01-04 MED ORDER — DEXTROSE 50 % IV SOLN
INTRAVENOUS | Status: AC
  Administered 2017-01-04: 50 mL
  Filled 2017-01-04: qty 50

## 2017-01-04 MED ORDER — INSULIN ASPART 100 UNIT/ML ~~LOC~~ SOLN
2.0000 [IU] | SUBCUTANEOUS | Status: DC
  Administered 2017-01-04: 4 [IU] via SUBCUTANEOUS
  Administered 2017-01-05 (×2): 6 [IU] via SUBCUTANEOUS

## 2017-01-04 MED ORDER — PANTOPRAZOLE SODIUM 40 MG IV SOLR
40.0000 mg | Freq: Two times a day (BID) | INTRAVENOUS | Status: DC
  Administered 2017-01-04 – 2017-01-05 (×2): 40 mg via INTRAVENOUS
  Filled 2017-01-04 (×2): qty 40

## 2017-01-04 MED ORDER — LEVOFLOXACIN 500 MG PO TABS: 500.0000 mg | ORAL_TABLET | ORAL | Status: DC

## 2017-01-04 MED ORDER — ORAL CARE MOUTH RINSE
15.0000 mL | Freq: Two times a day (BID) | OROMUCOSAL | Status: DC
  Administered 2017-01-04 – 2017-01-16 (×13): 15 mL via OROMUCOSAL

## 2017-01-04 MED ORDER — LACTATED RINGERS IV BOLUS (SEPSIS)
1000.0000 mL | Freq: Once | INTRAVENOUS | Status: AC
  Administered 2017-01-04: 1000 mL via INTRAVENOUS

## 2017-01-04 NOTE — Progress Notes (Signed)
Internal Medicine Attending:   I saw and examined the patient. I reviewed the resident's note and I agree with the resident's findings and plan as documented in the resident's note. Patinet doing much better, urine culture growing enterobacter.  Will change Abx to Levofloxacin given excellent coverage and his renal dysfunction.  This will also provide coverage for a suspected multifocal pneumonia based on chest xray.  Otherwise he will have MBS today for his dysphagia .  His renal function has improved. Still has a mild NAGMA, may be partially due to normal saline administration but he also has underlying Acute on CKD stage 4. If bicarb does not improve off of NS then will need to start oral bicarb supplementation.

## 2017-01-04 NOTE — Progress Notes (Signed)
Pharmacy Antibiotic Note  Ross ManifoldJulius L Bauer is a 71 y.o. male admitted on 01/02/2017 with pneumonia.  Pharmacy has been following his IV antibiotic therapy and he has responded but still has had a slight temp in the last 24 hours.  Today we have been asked to start Levofloxacin for UTI.  Urine culture obtained on 12/24 and reveals enterobacter.  He has a PMH of enterobacter UTI dating back to [id number].  He has renal insufficiency with crcl ~ 3315ml/min.  Plan: Levofloxacin 750mg  x 1 then 500mg  every 48 hours.   Monitor LOT, clinical progression, cultures  Height: 5\' 6"  (167.6 cm) Weight: 135 lb 9.3 oz (61.5 kg) IBW/kg (Calculated) : 63.8  Temp (24hrs), Avg:99 F (37.2 C), Min:97.5 F (36.4 C), Max:100.6 F (38.1 C)  Recent Labs  Lab 01/02/17 0343  01/02/17 1200 01/02/17 1829 01/02/17 2148 01/03/17 0249 01/04/17 0224  WBC 7.1  --   --   --   --  9.7 12.0*  CREATININE 5.99*   < > 4.40* 4.51* 4.61* 4.54*  4.52* 4.08*   < > = values in this interval not displayed.    Estimated Creatinine Clearance: 14 mL/min (A) (by C-G formula based on SCr of 4.08 mg/dL (H)).    Allergies  Allergen Reactions  . Ace Inhibitors     REACTION: intolerance   Antimicrobials this admission: Levofloxacin 750mg  x 1 then 500mg  q48 hr. Ceftriaxone 12/24>>12/26 Azithromycin 12/24>>12/26 Dose adjustments this admission: n/a  Microbiology results: 12/24 BCx >> ngtd 12/24 UCx>> Enterobacter  Nadara MustardNita Warner Laduca, PharmD., MS Clinical Pharmacist Pager:  [phone] Thank you for allowing pharmacy to be part of this patients care team. 01/04/2017 10:55 AM

## 2017-01-04 NOTE — Progress Notes (Addendum)
Modified Barium Swallow Progress Note  Patient Details  Name: Ross ManifoldJulius L Bangert MRN: [id number] Date of Birth: 08/01/1945  Today's Date: 01/04/2017  Modified Barium Swallow completed.  Full report located under Chart Review in the Imaging Section.  Brief recommendations include the following:  Clinical Impression  Prolonged mastication with solid texture moving bolus posteriorally and anteriorally prior to transition to oropharynx. Once into pharynx, pharyngeal constriction, hyolaryngeal elevation and excursion, epiglottic deflection was functional without penetration or aspiration and no significant residue. Increased work of breathing present. SLP suspects pt's impaired/prolonged mastication leads to dyspnea/fatigue and eventual expectoration, therefore recommend he continue puree and thin liquids. Esophagus scanned which was unremarkable. ** Of note, pt coughing prior, during and after MBS. Recommend pt take rest breaks during meals.         Swallow Evaluation Recommendations       SLP Diet Recommendations: Dysphagia 1 (Puree) solids;Thin liquid   Liquid Administration via: Straw;Cup   Medication Administration: Whole meds with liquid   Supervision: Patient able to self feed   Compensations: Slow rate;Small sips/bites   Postural Changes: Seated upright at 90 degrees   Oral Care Recommendations: Oral care BID        Royce MacadamiaLitaker, Masayoshi Couzens Willis 01/04/2017,11:47 AM   Breck CoonsLisa Willis Lonell FaceLitaker M.Ed [employer]-SLP Pager [phone]

## 2017-01-04 NOTE — Consult Note (Signed)
PULMONARY / CRITICAL CARE MEDICINE   Name: Ross Bauer MRN: [id number] DOB: 11-28-1945    ADMISSION DATE:  01/02/2017 CONSULTATION DATE: 12/26  REFERRING MD: Dr. Carlynn Purl IMTS  CHIEF COMPLAINT: Syncope  HISTORY OF PRESENT ILLNESS: 71 year old male with past medical history as below, which is significant for CKD stage IV, diabetes, hypertension, hyper lipidemia, anemia, and BPH.  He was admitted to [facility] on 12/24 after presenting with 2 weeks of URI symptoms and progressive weakness.  He was diagnosed with what was felt to be a community-acquired pneumonia and admitted to the internal medicine teaching service.  There was also some concern for urinary tract infection.  He was treated with Levaquin.  He was also felt to have HHS secondary to his diabetes mellitus in the setting of pneumonia. He had been improving with treatment on the floor, until the evening hours of 12/26 when he suffered a syncopal episode upon returning from the rest room. HR dropped into 30s and he was minimally responsive. This improved with atropine, calcium, bicarb. He was transferred to the ICU for further monitoring.    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  PAST SURGICAL HISTORY: He  has a past surgical history that includes No past surgeries.  Allergies  Allergen Reactions  . Ace Inhibitors     REACTION:  intolerance    No current facility-administered medications on file prior to encounter.    Current Outpatient Medications on File Prior to Encounter  Medication Sig  . amLODipine (NORVASC) 10 MG tablet Take 1 tablet (10 mg total) daily by mouth.  [name] aspirin 81 MG tablet Take 1 tablet (81 mg total) by mouth daily.  [name] atorvastatin (LIPITOR) 80 MG tablet Take 1 tablet (80 mg total) by mouth daily at 6 PM.  . carvedilol (COREG) 25 MG tablet Take 1 tablet (25 mg total) by mouth 2 (two) times daily with a meal.  . cloNIDine (CATAPRES) 0.1 MG tablet Take 1 tablet (0.1 mg total) by mouth every 12 (twelve) hours as needed. (Patient taking differently: Take 0.1 mg by mouth every 12 (twelve) hours as needed (hypertension). )  . furosemide (LASIX) 40 MG tablet Take 1 tablet (40 mg total) by mouth daily.  [name] glipiZIDE (GLUCOTROL) 10 MG tablet Take 1 tablet (10 mg total) by mouth 2 (two) times daily before a meal.  . terazosin (HYTRIN) 2 MG capsule Take 1 capsule (2 mg total) by mouth at bedtime. (Patient not taking: Reported on 01/02/2017)  . [DISCONTINUED] rosuvastatin (CRESTOR) 40 MG tablet Take 1 tablet (40 mg total) by mouth daily.    FAMILY HISTORY:  His indicated that his mother is deceased. He indicated that his father is deceased. He indicated that his sister is alive. He indicated that only one of his two brothers is alive.   SOCIAL HISTORY: He  reports that he has been smoking cigarettes.  He has a 6.00 pack-year smoking history. he has never used smokeless tobacco. He reports that he does not drink alcohol or use drugs.  REVIEW OF SYSTEMS:   Bolds are positive  Constitutional: weight loss, gain, night sweats, Fevers, chills, fatigue .  HEENT: headaches, Sore throat, sneezing, nasal congestion, post nasal drip, Difficulty swallowing, Tooth/dental problems, visual complaints visual changes, ear ache CV:  chest pain, radiates:,Orthopnea, PND, swelling in lower extremities, dizziness,  palpitations, syncope.  GI  heartburn, indigestion, abdominal pain, nausea, vomiting, diarrhea, change in bowel habits, loss of appetite, bloody stools, black stool maybe? He's not sure.  Resp: cough, productive:, hemoptysis, dyspnea, chest pain, pleuritic.  Skin: rash or itching or icterus GU: dysuria, change in color of urine, urgency or frequency. flank pain, hematuria  MS: joint pain or swelling. decreased range of motion  Psych: change in mood or affect. depression or anxiety.  Neuro: difficulty with speech, weakness, numbness, ataxia    SUBJECTIVE: some SOB, not sure if this is worse than when admitted. Not sure if he is having dark stools either.    VITAL SIGNS: BP (!) 84/47   Pulse 87   Temp (!) 97.5 F (36.4 C) (Oral)   Resp (!) 28   Ht 5\' 6"  (1.676 m)   Wt 61.5 kg (135 lb 9.3 oz)   SpO2 100%   BMI 21.88 kg/m   HEMODYNAMICS:    VENTILATOR SETTINGS:    INTAKE / OUTPUT: I/O last 3 completed shifts: In: 1533.8 [P.O.:960; I.V.:398.8; IV Piggyback:175] Out: 2300 [Urine:2300]  PHYSICAL EXAMINATION: General:  Frail elderly black male in NAD Neuro:  Sleepy, but easily arouses. Answers orientation questions appropriately.  HEENT:  [place]/At, PERRL, no JVD Cardiovascular:  RRR, no MRG Lungs:  Clear Abdomen:  Soft, non-tender, non-distended Musculoskeletal:  No acute deformity or ROM limitation Skin:  Dry flaky, grossly intact.   LABS:  BMET Recent Labs  Lab 01/02/17 2148 01/03/17 0249 01/04/17 0224  NA 142 142  142 138  K 5.7* 4.9  5.0 4.0  CL 120* 118*  117* 115*  CO2 11* 15*  16* 16*  BUN 103* 96*  96* 82*  CREATININE 4.61* 4.54*  4.52* 4.08*  GLUCOSE 296* 354*  353* 175*    Electrolytes Recent Labs  Lab 01/02/17 0655  01/02/17 2148 01/03/17 0249 01/04/17 0224  CALCIUM 7.9*   < > 8.1* 8.1*  8.1* 7.5*  MG 2.1  --   --   --   --   PHOS 4.0  --   --   --   --    < > = values in this interval not displayed.    CBC Recent Labs  Lab  01/03/17 0249 01/04/17 0224 01/04/17 1828  WBC 9.7 12.0* PENDING  HGB 8.1* 7.3* 5.1*  HCT 26.0* 22.8* 16.4*  PLT 275 242 221    Coag's No results for input(s): APTT, INR in the last 168 hours.  Sepsis Markers Recent Labs  Lab 01/02/17 0655  PROCALCITON 1.55    ABG Recent Labs  Lab 01/04/17 1815  PHART 7.227*  PCO2ART 31.7*  PO2ART 62.9*    Liver Enzymes Recent Labs  Lab 01/02/17 0343  AST 29  ALT 34  ALKPHOS 116  BILITOT 1.0  ALBUMIN 1.8*    Cardiac Enzymes No results for input(s): TROPONINI, PROBNP in the last 168 hours.  Glucose Recent Labs  Lab 01/03/17 1616 01/03/17 2022 01/04/17 0559 01/04/17 1114 01/04/17 1609 01/04/17 1816  GLUCAP 131* 88 178* 272* 259* 203*    Imaging Dg Swallowing Func-speech Pathology  Result Date: 01/04/2017 Objective Swallowing Evaluation: Type of Study: MBS-Modified Barium Swallow Study  Patient Details Name: Virgel ManifoldJulius L Lizama MRN: [id number] Date of Birth: 11/18/1945 Today's Date: 01/04/2017 Time: SLP Start Time (ACUTE ONLY): 1009 -SLP Stop Time (ACUTE ONLY): 1024 SLP Time Calculation (min) (ACUTE ONLY): 15 min Past Medical History: Past Medical History: Diagnosis Date . Anemia in chronic kidney disease 06/19/2009 . Background diabetic retinopathy associated with type 2 diabetes mellitus (HCC) 11/13/2013  Bilateral. Followed by ophthalmologist Dr. Ernesto Rutherfordobert Groat. [name]. BPH (benign prostatic hyperplasia) 09/30/2016 . Chronic back pain 05/09/2007  "all over" (01/02/2017) . Essential hypertension  . Hyperlipidemia  . Onychomycosis of right great toe 06/01/2015 . Pneumonia  . 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 Lowell GuitarPowell. . Tobacco abuse 03/20/2007 . Type 2 diabetes mellitus with stage 4 chronic kidney disease (HCC)  . 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 NO PAST  SURGERIES   HPI: 71 [facility] 2 DM with hypersomolar nonketotic hyperglycemia and found to have pna.  PMH + for DM, confusion, poor po intake, weakness, rapid response called with pt with RR 30s to 40s, 90s oxygen saturation on 5 liters oxygen.  Per family member in room, pt with poor intake prior to admit and episode of vertigo a few weeks ago.  Per daughter, pt refused to go to MD with vertigo episode.  He also had dysphagia prior to admit with symptoms including sensation of residuals in pharynx, oral holding and bringing food/drink up after intake.  Today pt is fully alert with clear voice. .   Subjective: pt alert in bed Assessment / Plan / Recommendation CHL IP CLINICAL IMPRESSIONS 01/04/2017 Clinical Impression Prolonged mastication with solid texture moving bolus posteriorally and anteriorally prior to transition to oropharynx. Once into pharynx, pharyngeal constriction, hyolaryngeal elevation and excursion, epiglottic deflection was functional without penetration or aspiration and no significant residue. Increased work of breathing present. SLP suspects pt's impaired/prolonged mastication leads to dyspnea/fatigue and eventual expectoration, therefore recommend he continue puree and thin liquids. Esophagus scanned which was unremarkable. Recommend pt take rest breaks during meals.       SLP Visit Diagnosis Dysphagia, oral phase (R13.11) Attention and concentration deficit following -- Frontal lobe and executive function deficit following -- Impact on safety and function Mild aspiration risk   CHL IP TREATMENT RECOMMENDATION 01/04/2017 Treatment Recommendations Therapy as outlined in treatment plan below   Prognosis 01/04/2017 Prognosis for Safe Diet Advancement Good Barriers to Reach Goals -- Barriers/Prognosis Comment -- CHL IP DIET RECOMMENDATION 01/04/2017 SLP Diet Recommendations Dysphagia 1 (Puree) solids;Thin liquid Liquid Administration via Straw;Cup Medication Administration Whole  meds with liquid Compensations Slow rate;Small sips/bites Postural Changes Seated upright at 90 degrees   CHL IP OTHER RECOMMENDATIONS 01/04/2017 Recommended Consults -- Oral Care Recommendations Oral care BID Other Recommendations --   CHL IP FOLLOW UP RECOMMENDATIONS 01/04/2017 Follow up Recommendations None   CHL IP FREQUENCY AND DURATION 01/04/2017 Speech Therapy Frequency (ACUTE ONLY) min 2x/week Treatment Duration 2 weeks      CHL IP ORAL PHASE 01/04/2017 Oral Phase Impaired Oral - Pudding Teaspoon -- Oral - Pudding Cup -- Oral - Honey Teaspoon -- Oral - Honey Cup -- Oral - Nectar Teaspoon -- Oral - Nectar Cup -- Oral - Nectar Straw -- Oral - Thin Teaspoon -- Oral - Thin Cup Delayed oral transit;Holding of bolus Oral - Thin Straw Delayed oral transit Oral - Puree -- Oral - Mech  Soft -- Oral - Regular Delayed oral transit;Other (Comment) Oral - Multi-Consistency -- Oral - Pill -- Oral Phase - Comment --  CHL IP PHARYNGEAL PHASE 01/04/2017 Pharyngeal Phase WFL Pharyngeal- Pudding Teaspoon -- Pharyngeal -- Pharyngeal- Pudding Cup -- Pharyngeal -- Pharyngeal- Honey Teaspoon -- Pharyngeal -- Pharyngeal- Honey Cup -- Pharyngeal -- Pharyngeal- Nectar Teaspoon -- Pharyngeal -- Pharyngeal- Nectar Cup -- Pharyngeal -- Pharyngeal- Nectar Straw -- Pharyngeal -- Pharyngeal- Thin Teaspoon -- Pharyngeal -- Pharyngeal- Thin Cup -- Pharyngeal -- Pharyngeal- Thin Straw -- Pharyngeal -- Pharyngeal- Puree -- Pharyngeal -- Pharyngeal- Mechanical Soft -- Pharyngeal -- Pharyngeal- Regular -- Pharyngeal -- Pharyngeal- Multi-consistency -- Pharyngeal -- Pharyngeal- Pill -- Pharyngeal -- Pharyngeal Comment --  CHL IP CERVICAL ESOPHAGEAL PHASE 01/04/2017 Cervical Esophageal Phase WFL Pudding Teaspoon -- Pudding Cup -- Honey Teaspoon -- Honey Cup -- Nectar Teaspoon -- Nectar Cup -- Nectar Straw -- Thin Teaspoon -- Thin Cup -- Thin Straw -- Puree -- Mechanical Soft -- Regular -- Multi-consistency -- Pill -- Cervical Esophageal Comment --  No flowsheet data found. Royce Macadamia 01/04/2017, 11:46 AM Breck Coons Lonell Face.Ed CCC-SLP Pager [phone]               STUDIES:  MBS 12/26 > oral phase dysphagia, mild aspiration risk, rec: puree and thin  CULTURES: Blood culture 12/24 >>> Urine 12/24 > enterobacter >>>  ANTIBIOTICS: Azithromycin 12/24 >12/25 Rocephin 12/24 Unasyn 12/25 Levaquin 12/26 >  SIGNIFICANT EVENTS: 12/24 admit 12/26 syncope, icu transfer hgb drop to 5.1  LINES/TUBES:   DISCUSSION: 71 year old male admitted for CAP, UTI, and HHS. Was improving with ABX, insulin, hydration. 12/26 had syncopal episode with bradycardia. Hemoglobin found to be 5.1. No evidence of bleeding. He reports no clear GIB symptoms. Will give blood. ddx includes GIB and AAA.   ASSESSMENT / PLAN:  PULMONARY A: Acute hypoxemic respiratory failure CAP  P:   Supplemental O2 titrated to keep SpO2 > 92% ABX as below Repeat CXR in AM IS   CARDIOVASCULAR A:  Shock, suspected hypovolemic/hemorhagic  H/o HTN, HLD  P:  Telemetry monitoring Hold crystalloid Blood ordered Levophed going at 2 > hope to get this off with blood.  Repeat H&H Ensure lactic clearing  RENAL A:   CKD IV, some acute renal injury as well. Creat improving.  Hyperkalemia - improved    P:   Follow BMP KVO  GASTROINTESTINAL A:   Possible GI bleeding, no clear other focus Dysphagia  P:   NPO after events of tonight May need GI consult tonight if any evidence of bleeding develops.  Hemoccult  Diet per SLP once able to take PO Will start Protonix IV BID   HEMATOLOGIC A:   ABLA Anemia of chronic illness   P:  Transfusing 2 units PRBC Serial CBC Coags pending STAT US abdomen to r/o AAA  INFECTIOUS A:   CAP UTI - enterococcus  P:   Transition to Zosyn for more broad coverage Follow cultures  ENDOCRINE A:   DM HHS vs DKA resolved  P:   Hold lantus now that NPO again CBG monitoring and SSI q 4  hours.  NEUROLOGIC A:   Acute metabolic encphalopathy P:   Monitor   FAMILY  - Updates: Patient updated.   - Inter-disciplinary family meet or Palliative Care meeting due by:  12/30   Joneen Roach, AGACNP-BC [facility] Pager [phone] or [phone]  01/04/2017 7:58 PM

## 2017-01-04 NOTE — Progress Notes (Addendum)
Inpatient Diabetes Program Recommendations  AACE/ADA: New Consensus Statement on Inpatient Glycemic Control (2015)  Target Ranges:  Prepandial:   less than 140 mg/dL      Peak postprandial:   less than 180 mg/dL (1-2 hours)      Critically ill patients:  140 - 180 mg/dL   Lab Results  Component Value Date   GLUCAP 178 (H) 01/04/2017   HGBA1C 11.3 (H) 01/02/2017   Results for Ross Bauer, Ross Bauer (MRN [id number]) as of 01/04/2017 11:13  Ref. Range 09/30/2016 09:30 01/02/2017 06:55  Hemoglobin A1C Latest Ref Range: 4.8 - 5.6 % 6.4 11.3 (H)   Review of Glycemic ControlResults for Ross Bauer, Ross Bauer (MRN [id number]) as of 01/04/2017 11:13  Ref. Range 01/03/2017 06:35 01/03/2017 11:04 01/03/2017 16:16 01/03/2017 20:22 01/04/2017 05:59  Glucose-Capillary Latest Ref Range: 65 - 99 mg/dL 782228 (H) 956546 (HH) 213131 (H) 88 178 (H)    Diabetes history: Type 2 DM Outpatient Diabetes medications: Glucotrol 10 mg bid Current orders for Inpatient glycemic control:  Lantus 15 units q HS, Novolog 4 units tid with meals, Novolog moderate tid with meals and HS  Inpatient Diabetes Program Recommendations:    Note that A1C elevated. Based on A1C, patient may need insulin at d/c.  Will talk to patient regarding elevated A1C.   Thanks,  Beryl MeagerJenny Thuan Tippett, RN, BC-ADM Inpatient Diabetes Coordinator Pager [phone] (8a-5p)  1500- Attempted to speak with patient regarding elevated A1C.  Patient kept eyes closed and states that he takes pills for DM and does not check blood sugars.  He lives with his grandson.  Explained that A1C was elevated and that patient may need insulin at d/c.  He did not seem engaged during conversation.  Mentioned that he needs to eliminate sugar from his beverages, however unsure if he understood.  Will continue to follow.  Not sure that patient will be able to self-administer insulin at d/c if needed.

## 2017-01-04 NOTE — Progress Notes (Signed)
CRITICAL VALUE ALERT  Critical Value:  Lactic acid 4.7  Date & Time Notied:  01/04/17 7:38 PM  Provider Notified: Joneen RoachPaul Hoffman, NP  Orders Received/Actions taken: repeat in 3 hours

## 2017-01-04 NOTE — Code Documentation (Signed)
   71 y/o male with multiple medical problems admitted with multifocal PNA and nonketotic hyperosmolar hyperglycemia.    Was feeling ok talking to family. Was coming back from bathroom and suddenly became bradycardic and non-responsive.   Code Blue called. I was on the floor and responded.  Patient initially with agonal respirations - unresponsive. Being bagged. RRT and IMTS code team at bedside. HR in 30-40s. SBP 90-100 range. Never lost pulse.   CBG checked and ~210. Given 1 amp atropine and calcium. ABG drawn 7.22/30/65. Given amp of bicarb.   Patient began to respond. Able to avoid intubation for now. I assisted personally with transfer to 26M and gave report to CCM doc.   Care turned over to IMTS and CCM teams.   CRITICAL CARE Performed by: Arvilla MeresBensimhon, Pavel Gadd  Total critical care time: 45 minutes  Critical care time was exclusive of separately billable procedures and treating other patients.  Critical care was necessary to treat or prevent imminent or life-threatening deterioration.  Critical care was time spent personally by me (independent of midlevel providers or residents) on the following activities: development of treatment plan with patient and/or surrogate as well as nursing, discussions with consultants, evaluation of patient's response to treatment, examination of patient, obtaining history from patient or surrogate, ordering and performing treatments and interventions, ordering and review of laboratory studies, ordering and review of radiographic studies, pulse oximetry and re-evaluation of patient's condition.  Arvilla Meresaniel Jamil Armwood, MD  7:02 PM

## 2017-01-04 NOTE — Code Documentation (Signed)
  Patient Name: Ross ManifoldJulius L Zerkle   MRN: [id number]   Date of Birth/ Sex: 05/17/1945 , male      Admission Date: 01/02/2017  Attending Provider: Gust RungHoffman, Erik C, DO  Primary Diagnosis: Type 2 diabetes mellitus with hyperosmolar nonketotic hyperglycemia (HCC)   Indication: Pt was in his usual state of health until this PM, when he was noted to be unresponsive by family, and was found to be bradycardic with HR in 30s. Code blue was subsequently called. At the time of arrival on scene, ACLS protocol was underway.   Technical Description:  - CPR performance duration:  0 minutes  - Was defibrillation or cardioversion used? No   - Was external pacer placed? No  - Was patient intubated pre/post CPR? No   Medications Administered: Y = Yes; Blank = No Amiodarone    Atropine  Y  Calcium  Y  Epinephrine    Lidocaine    Magnesium    Norepinephrine    Phenylephrine    Sodium bicarbonate  Y  Vasopressin     Post CPR evaluation:  - Final Status - Was patient successfully resuscitated ? Yes - What is current rhythm? Sinus Rhythm  - What is current hemodynamic status? Stable  Miscellaneous Information:  - Labs sent, including: ABG, CBC, Lactic AcidType and Screen  - Primary team notified?  Yes  - Family Notified? Yes  - Additional notes/ transfer status: Patient transferred to the ICU and PCCM to take over care.      Toney RakesLacroce, Kingdom Vanzanten J, MD  01/04/2017, 7:09 PM

## 2017-01-04 NOTE — Progress Notes (Signed)
Subjective: Patient was seen resting comfortably in his bed this morning eating breakfast. He is happy to be back on a diet again. He states that he is not feeling short of breath or experiencing any urinary symptoms or pain.   Objective:  Vital signs in last 24 hours: Vitals:   01/03/17 2019 01/04/17 0020 01/04/17 0435 01/04/17 0812  BP: (!) 115/58 (!) 136/59 (!) 138/49 (!) 154/60  Pulse: 96   96  Resp: (!) 22 (!) 22  (!) 33  Temp: 98.8 F (37.1 C) 98.9 F (37.2 C) (!) 100.6 F (38.1 C) (!) 97.5 F (36.4 C)  TempSrc: Oral Oral Oral Oral  SpO2:    100%  Weight: 135 lb 9.3 oz (61.5 kg)  135 lb 9.3 oz (61.5 kg)   Height: 5\' 6"  (1.676 m)      Physical Exam  Constitutional: He is oriented to person, place, and time.  Thin, Elderly Male  HENT:  Mouth/Throat: Oropharynx is clear and moist.  Temporal wasting  Eyes: EOM are normal. Right eye exhibits no discharge. Left eye exhibits no discharge.  Cardiovascular: Normal rate, regular rhythm, normal heart sounds and intact distal pulses.  Pulmonary/Chest: Effort normal. No respiratory distress.  Mild basilar crackles  Abdominal: Soft. Bowel sounds are normal. He exhibits no distension. There is no tenderness.  Musculoskeletal: He exhibits no edema or deformity.  Neurological: He is alert and oriented to person, place, and time.  Skin: Skin is warm and dry.  Psychiatric: He has a normal mood and affect.    Assessment/Plan:  Principal Problem:   Type 2 diabetes mellitus with hyperosmolar nonketotic hyperglycemia (HCC) Active Problems:   Urinary tract infection without hematuria   Cough  Multi-focal Pneumonia: Improving. Patient presented with increased cough, tachypnea, oxygen requirement in the setting of recent viral illness. No Leukocytosis. CXR concerning for multi-focal pneumonia.  Procalcitonin 1.55. Flu panel negative. Considering his dysphagia and multi-focal opacities, there was some concern for aspiration pneumonia.  Patient did spike a fever of 100.9 and was switched from ceftriaxone to Unasyn for anaerobic coverage. Antibiotics switched to Levofloxacin only to cover for both Pneumonia and UTI. - Today: Dyspnea and lung sounds continue to impove - Blood cultures: NGTD - Monitor vital signs, fever curve  - Azithromycin and Unasyn Discontinued - Levofloxacin 750mg  Once, Then 500mg  q48 - Supplemental O2 prn  Acute on Chronic Kidney Disease (CKD IV), Hyperkalemia AKI improving with Cr 4.0. Hyperkalemia resolved. History of CKD IV; Established with Nephrology; Baseline Cr ~3.2. On admission Cr 6.0; due to marked dehydration 2/2 HHS and dysphagia. On admission K 6.6; received temporizing measures (albuterol and insulin). Resolved while on Insulin drip for HHS. Has been receiving serial BMPs and depending on his overall response to tx, will consider Nephrology consult should he need HD during this admission. Phosphorous 4.0 (WNL). - Cardiac monitoring - BMP in AM - I/O's  UTI: U/A noted moderate leukocytes, negative nitrites, few bacteria on micro. No complaints of dysuria. Ceftriaxone initially and now Unasyn should cover for urinary pathogens. Urine culture collected prior to antibiotic administration. - F/u Urine Culture: Grew Enterobacter, awaiting sesitivities - Levofloxacin 750mg  Once, Then 500mg  q48h  Dysphagia: Stable. Daughter reports concern for dysphagia, with decreased PO intake and difficulty swallowing over the week prior to admission. She reports coughing shortly after eating food. His neurologic exam was without focal deficits, low suspicion for acute neurologic etiology of dysphagia. - Dysphagia 1 Diet / Thin - Modified Barium Swallow  Hyperglycemia Type  2 DM: Stable. A1c 11.3 (6.4, 3 months ago) Blucose >1000 on presentation in the setting of not taking home medications due to decreased PO intake. His anion gap was normal. With Hyperglycemia, type 2 DM, and elevated Serum Osm 353; this is  was determined to be HHS rather than DKA. Patient improved throughout the day on Insulin gtt and was transitioned to SubQ insulin as glucose came down. - Patient now dys 1 diet - CBG monitoring - Lantus 15U qhs - Novolog 4 TID AC - SSI-M TID AC - QHS correction coverage  Acute on Chronic Anemia Hgb 7.3 (12/26). Pt presenting with Hgb of 8.8, was previously around 11.5 and attributed to anemia of chronic disease and renal disease, though on further review of scanned lab results with nephrology this may be a more gradual decline. No reported signs/symptoms of bleeding.  - Fe studies: Ferritin 593, Fe 35, TIBC 157, T.Saturation 22% - CBC in AM  H/o Hypertension: Currently normo to mildly hypertensive, last BP 154/60. On Amlodipine, Clonidine, Carvedilol, and Furosemide at home. Holding furosemide and clonidine.   - Restart Carvedilol 25mg  BID - Restart Amlodipine 10mg  Daily  Dispo: Anticipated discharge in approximately 1-3 day(s).   Beola CordMelvin, Lauria Depoy, MD 01/04/2017, 11:28 AM Pager: [phone]

## 2017-01-04 NOTE — Progress Notes (Signed)
Pharmacy Antibiotic Note  Ross ManifoldJulius L Raabe is a 71 y.o. male admitted on 01/02/2017 with URI symptoms and progressive weakness.  Pharmacy has been consulted for Zosyn dosing for PNA and Enterobacter UTI. Known CKD stage 4.  Of note, patient had syncopal episode with agonal breathing and bradycardia. Code Blue called and patient transferred to ICU.  Plan: Zosyn 2.25 g IV q6h Monitor renal function, clinical progress, C/S, de-escalate as able  Height: 5\' 6"  (167.6 cm) Weight: 135 lb 9.3 oz (61.5 kg) IBW/kg (Calculated) : 63.8  Temp (24hrs), Avg:98.4 F (36.9 C), Min:97.5 F (36.4 C), Max:100.6 F (38.1 C)  Recent Labs  Lab 01/02/17 0343  01/02/17 1200 01/02/17 1829 01/02/17 2148 01/03/17 0249 01/04/17 0224 01/04/17 1828 01/04/17 1850  WBC 7.1  --   --   --   --  9.7 12.0* 9.8  --   CREATININE 5.99*   < > 4.40* 4.51* 4.61* 4.54*  4.52* 4.08*  --   --   LATICACIDVEN  --   --   --   --   --   --   --   --  4.7*   < > = values in this interval not displayed.    Estimated Creatinine Clearance: 14 mL/min (A) (by C-G formula based on SCr of 4.08 mg/dL (H)).    Allergies  Allergen Reactions  . Ace Inhibitors     REACTION: intolerance    Antimicrobials this admission: Zosyn 12/26 >> Levofloxacin 12/26 x1 Ceftriaxone 12/24>> 12/25 Azithromycin 12/24>>12/26 Unasyn 12/25 >>12/26  Dose adjustments this admission:   Microbiology results: 12/24 BCx: ngtd 12/24 UCx: Enterobacter    Thank you for allowing pharmacy to be a part of this patient's care.  Loura BackJennifer Miramiguoa Park, PharmD, BCPS Clinical Pharmacist Phone for today [phone] Main pharmacy - [phone] 01/04/2017 8:07 PM

## 2017-01-04 NOTE — Progress Notes (Signed)
Initial Nutrition Assessment  DOCUMENTATION CODES:   Severe malnutrition in context of chronic illness  INTERVENTION:  1. Premier Protein BID, each supplement provides 160 calories and 30 gm protein  NUTRITION DIAGNOSIS:   Severe Malnutrition related to chronic illness as evidenced by moderate fat depletion, severe muscle depletion.  GOAL:   Patient will meet greater than or equal to 90% of their needs  MONITOR:   PO intake, Supplement acceptance, I & O's, Labs, Weight trends  REASON FOR ASSESSMENT:   Malnutrition Screening Tool    ASSESSMENT:   [name] is a 71 yo M with a past medical history of CKD IV, DM, HTN, HLD, chronic anemia, BPH who presented to the ED with complaints of altered mental status. Found to have UTI, hyperosmolar nonketotic hyperglycemia and PNA. Now on NDD1/thin liquids per SLP.  Spoke with [name] at bedside. He reports poor appetite x1 week, didn't eat anything. He states when he is eating normally he waxes and wanes, sometimes eats well, sometimes eats very little, but continues to state that he did not eat big meals even when he ate. Patient is not specific about PO intake. Had breakfast but he is unable to remember what he had. Unsure of amount of weight loss though he does admit to losing weight.  19 pound/12.3% severe weight loss over 3 months per chart.  Labs reviewed:  CBGs 178, 88, 131  Medications reviewed and include:  Insulin, Colace, Miralax   Intake/Output Summary (Last 24 hours) at 01/04/2017 1525 Last data filed at 01/04/2017 0700 Gross per 24 hour  Intake 1413.75 ml  Output 1600 ml  Net -186.25 ml     NUTRITION - FOCUSED PHYSICAL EXAM:    Most Recent Value  Orbital Region  Moderate depletion  Upper Arm Region  Moderate depletion  Thoracic and Lumbar Region  Moderate depletion  Buccal Region  Moderate depletion  Temple Region  Moderate depletion  Clavicle Bone Region  Severe depletion  Clavicle and Acromion Bone  Region  Severe depletion  Scapular Bone Region  Severe depletion  Dorsal Hand  Moderate depletion  Patellar Region  Severe depletion  Anterior Thigh Region  Severe depletion  Posterior Calf Region  Severe depletion  Edema (RD Assessment)  None  Hair  Reviewed  Eyes  Reviewed  Mouth  Reviewed  Skin  Reviewed  Nails  Reviewed       Diet Order:  DIET - DYS 1 Room service appropriate? Yes; Fluid consistency: Thin  EDUCATION NEEDS:   Education needs have been addressed  Skin:  Skin Assessment: Reviewed RN Assessment  Last BM:  01/03/2017  Height:   Ht Readings from Last 1 Encounters:  01/03/17 5\' 6"  (1.676 m)    Weight:   Wt Readings from Last 1 Encounters:  01/04/17 135 lb 9.3 oz (61.5 kg)    Ideal Body Weight:  64.54 kg  BMI:  Body mass index is 21.88 kg/m.  Estimated Nutritional Needs:   Kcal:  1600-1900 calories  Protein:  92-104 grams (1.5g-1.7g/kg)  Fluid:  1.6-1.9L  Dionne AnoWilliam M. Nyaira Hodgens, MS, RD LDN Inpatient Clinical Dietitian Pager [phone]

## 2017-01-05 ENCOUNTER — Inpatient Hospital Stay (HOSPITAL_COMMUNITY): Payer: Medicare HMO

## 2017-01-05 LAB — GLUCOSE, CAPILLARY
GLUCOSE-CAPILLARY: 190 mg/dL — AB (ref 65–99)
GLUCOSE-CAPILLARY: 188 mg/dL — AB (ref 65–99)
Glucose-Capillary: 111 mg/dL — ABNORMAL HIGH (ref 65–99)
Glucose-Capillary: 208 mg/dL — ABNORMAL HIGH (ref 65–99)
Glucose-Capillary: 222 mg/dL — ABNORMAL HIGH (ref 65–99)
Glucose-Capillary: 261 mg/dL — ABNORMAL HIGH (ref 65–99)

## 2017-01-05 LAB — BASIC METABOLIC PANEL
ANION GAP: 7 (ref 5–15)
BUN: 74 mg/dL — AB (ref 6–20)
CHLORIDE: 111 mmol/L (ref 101–111)
CO2: 18 mmol/L — ABNORMAL LOW (ref 22–32)
Calcium: 7.4 mg/dL — ABNORMAL LOW (ref 8.9–10.3)
Creatinine, Ser: 3.58 mg/dL — ABNORMAL HIGH (ref 0.61–1.24)
GFR calc Af Amer: 18 mL/min — ABNORMAL LOW (ref >60)
GFR, EST NON AFRICAN AMERICAN: 16 mL/min — AB (ref >60)
Glucose, Bld: 209 mg/dL — ABNORMAL HIGH (ref 65–99)
POTASSIUM: 4.1 mmol/L (ref 3.5–5.1)
SODIUM: 136 mmol/L (ref 135–145)

## 2017-01-05 LAB — CBC
HCT: 25.8 % — ABNORMAL LOW (ref 39.0–52.0)
HEMOGLOBIN: 8.4 g/dL — AB (ref 13.0–17.0)
MCH: 29 pg (ref 26.0–34.0)
MCHC: 32.6 g/dL (ref 30.0–36.0)
MCV: 89 fL (ref 78.0–100.0)
Platelets: 217 10*3/uL (ref 150–400)
RBC: 2.9 MIL/uL — AB (ref 4.22–5.81)
RDW: 15.2 % (ref 11.5–15.5)
WBC: 12.4 10*3/uL — AB (ref 4.0–10.5)

## 2017-01-05 LAB — URINE CULTURE: Culture: >=100000 — AB

## 2017-01-05 LAB — LACTIC ACID, PLASMA: Lactic Acid, Venous: 1.4 mmol/L (ref 0.5–1.9)

## 2017-01-05 MED ORDER — INSULIN ASPART 100 UNIT/ML ~~LOC~~ SOLN
0.0000 [IU] | SUBCUTANEOUS | Status: DC
  Administered 2017-01-06 (×5): 3 [IU] via SUBCUTANEOUS
  Administered 2017-01-06: 2 [IU] via SUBCUTANEOUS
  Administered 2017-01-06: 5 [IU] via SUBCUTANEOUS
  Administered 2017-01-07 (×2): 2 [IU] via SUBCUTANEOUS

## 2017-01-05 MED ORDER — LEVOFLOXACIN 250 MG PO TABS: 250.0000 mg | ORAL_TABLET | Freq: Every day | ORAL | Status: DC

## 2017-01-05 MED ORDER — PANTOPRAZOLE SODIUM 40 MG PO TBEC
40.0000 mg | DELAYED_RELEASE_TABLET | Freq: Every day | ORAL | Status: DC
  Administered 2017-01-06 – 2017-01-16 (×10): 40 mg via ORAL
  Filled 2017-01-05 (×10): qty 1

## 2017-01-05 MED ORDER — LEVOFLOXACIN 500 MG PO TABS: 500.0000 mg | ORAL_TABLET | Freq: Once | ORAL | Status: DC

## 2017-01-05 MED ORDER — LEVOFLOXACIN 500 MG PO TABS
500.0000 mg | ORAL_TABLET | ORAL | Status: DC
  Administered 2017-01-05 – 2017-01-07 (×2): 500 mg via ORAL
  Filled 2017-01-05 (×2): qty 1

## 2017-01-05 MED ORDER — INSULIN GLARGINE 100 UNIT/ML ~~LOC~~ SOLN
10.0000 [IU] | Freq: Every day | SUBCUTANEOUS | Status: DC
  Administered 2017-01-06 – 2017-01-07 (×3): 10 [IU] via SUBCUTANEOUS
  Filled 2017-01-05 (×3): qty 0.1

## 2017-01-05 MED ORDER — INSULIN ASPART 100 UNIT/ML ~~LOC~~ SOLN
2.0000 [IU] | Freq: Three times a day (TID) | SUBCUTANEOUS | Status: DC
  Administered 2017-01-05: 4 [IU] via SUBCUTANEOUS

## 2017-01-05 NOTE — Progress Notes (Signed)
Inpatient Diabetes Program Recommendations  AACE/ADA: New Consensus Statement on Inpatient Glycemic Control (2015)  Target Ranges:  Prepandial:   less than 140 mg/dL      Peak postprandial:   less than 180 mg/dL (1-2 hours)      Critically ill patients:  140 - 180 mg/dL   Lab Results  Component Value Date   GLUCAP 208 (H) 01/05/2017   HGBA1C 11.3 (H) 01/02/2017    Review of Glycemic Control Results for Ross ManifoldDOBBINS, Cedrick L (MRN [id number]) as of 01/05/2017 10:24  Ref. Range 01/04/2017 18:16 01/04/2017 19:23 01/04/2017 22:59 01/05/2017 03:07 01/05/2017 07:16  Glucose-Capillary Latest Ref Range: 65 - 99 mg/dL 409203 (H) 811178 (H) 63 (L) 222 (H) 208 (H)   Diabetes history: Type 2 DM Outpatient Diabetes medications: Glucotrol 10 mg bid Current orders for Inpatient glycemic control:  ICU glycemic control order set-Novolog 2-4-6 q 4 hours  Inpatient Diabetes Program Recommendations:   Please consider d/c of ICU glycemic control order set.  Also please restart basal insulin such as Lantus 10 units daily and Novolog sensitive q 4 hours.   Thanks,  Beryl MeagerJenny Rut Betterton, RN, BC-ADM Inpatient Diabetes Coordinator Pager [phone] (8a-5p)

## 2017-01-05 NOTE — NC FL2 (Addendum)
[place] MEDICAID FL2 LEVEL OF CARE SCREENING TOOL     IDENTIFICATION  Patient Name: Ross Bauer Birthdate: 04/22/1945 Sex: male Admission Date (Current Location): 01/02/2017  [facility] [place]   Facility and Address:  [facility], [address], [place], [place] [address]      Provider Number:    Attending Physician Name and Address:  Gust RungHoffman, Erik C, DO  Relative Name and Phone Number:       Current Level of Care: Hospital Recommended Level of Care: Skilled Nursing Facility Prior Approval Number:    Date Approved/Denied:   PASRR Number:   [id number]   Discharge Plan: SNF    Current Diagnoses: Patient Active Problem List   Diagnosis Date Noted  . Protein-calorie malnutrition, severe 01/04/2017  . Respiratory arrest (HCC)   . Cough   . Type 2 diabetes mellitus with hyperosmolar nonketotic hyperglycemia (HCC) 01/02/2017  . Hyperglycemia   . Hyperkalemia   . Multifocal pneumonia   . Benign prostatic hyperplasia with nocturia 09/30/2016  . Onychomycosis of right great toe 06/01/2015  . Background diabetic retinopathy associated with type 2 diabetes mellitus (HCC) 11/13/2013  . Urinary tract infection without hematuria 08/01/2013  . Healthcare maintenance 03/02/2010  . Anemia in chronic kidney disease 06/19/2009  . Hyperlipidemia 03/27/2008  . Chronic low back pain 05/09/2007  . Tobacco abuse 03/20/2007  . Type 2 diabetes mellitus with stage 4 chronic kidney disease (HCC) 11/16/2005  . 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 Essential hypertension 11/16/2005  . 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) 11/16/2005    Orientation RESPIRATION BLADDER Height & Weight     Self, Time, Situation, Place  Other (Comment)(Nasal Cannula 2 L) Incontinent (External Catheter) Weight: 135 lb 9.3 oz (61.5 kg) Height:  5\' 6"  (167.6 cm)  BEHAVIORAL  SYMPTOMS/MOOD NEUROLOGICAL BOWEL NUTRITION STATUS      Incontinent Diet(please see discharge summary.)  AMBULATORY STATUS COMMUNICATION OF NEEDS Skin   Limited Assist   Normal  Moisture Associated Skin Damage (buttocks)                     Personal Care Assistance Level of Assistance  Bathing, Feeding, Dressing Bathing Assistance: Maximum assistance Feeding assistance: Limited assistance Dressing Assistance: Maximum assistance     Functional Limitations Info  Sight, Hearing, Speech Sight Info: Adequate Hearing Info: Adequate Speech Info: Adequate    SPECIAL CARE FACTORS FREQUENCY  PT (By licensed PT), OT (By licensed OT)     PT Frequency: 5 times a week  OT Frequency: 5 times a week             Contractures Contractures Info: Not present    Additional Factors Info  Code Status, Allergies Code Status Info: Full Code Allergies Info: Ace Inhibitors           Current Medications (01/05/2017):  This is the current hospital active medication list Current Facility-Administered Medications  Medication Dose Route Frequency Provider Last Rate Last Dose  . 0.9 %  sodium chloride infusion   Intravenous Once Toney RakesLacroce, Samantha J, MD      . acetaminophen (TYLENOL) tablet 650 mg  650 mg Oral Q6H PRN Arnetha CourserAmin, Sumayya, MD       Or  . acetaminophen (TYLENOL) suppository 650 mg  650 mg Rectal Q6H PRN Arnetha CourserAmin, Sumayya, MD      . atorvastatin (LIPITOR) tablet 80 mg  80 mg  Oral q1800 Arnetha CourserAmin, Sumayya, MD   80 mg at 01/04/17 1701  . docusate sodium (COLACE) capsule 100 mg  100 mg Oral BID Debe CoderMullen, Emily B, MD   100 mg at 01/04/17 2132  . insulin aspart (novoLOG) injection 2-6 Units  2-6 Units Subcutaneous TID WC Agarwala, Ravi, MD      . levofloxacin (LEVAQUIN) tablet 500 mg  500 mg Oral Q48H Agarwala, Ravi, MD   500 mg at 01/05/17 1150  . MEDLINE mouth rinse  15 mL Mouth Rinse BID Carlynn PurlHoffman, Erik C, DO   15 mL at 01/05/17 [id number]  . ondansetron (ZOFRAN) tablet 4 mg  4 mg Oral Q6H PRN Arnetha CourserAmin,  Sumayya, MD       Or  . ondansetron (ZOFRAN) injection 4 mg  4 mg Intravenous Q6H PRN Arnetha CourserAmin, Sumayya, MD   4 mg at 01/02/17 0711  . pantoprazole (PROTONIX) EC tablet 40 mg  40 mg Oral Daily Lynnell CatalanAgarwala, Ravi, MD         Discharge Medications: Please see discharge summary for a list of discharge medications.  Relevant Imaging Results:  Relevant Lab Results:   Additional Information SSN- [id number]  Robb MatarKierra S Wiley, LCSWA

## 2017-01-05 NOTE — Progress Notes (Signed)
eLink Physician-Brief Progress Note Patient Name: Ross ManifoldJulius L Bauer DOB: 07/16/1945 MRN: [id number]   Date of Service  01/05/2017  HPI/Events of Note  Blood sugar 261  eICU Interventions  Start lantus 10 units and q4 SSI coverage per diabetes coordinator reccs     Intervention Category Intermediate Interventions: Hyperglycemia - evaluation and treatment  Avie Checo 01/05/2017, 11:58 PM

## 2017-01-05 NOTE — Progress Notes (Addendum)
eLink Physician-Brief Progress Note Patient Name: Ross ManifoldJulius L Bauer DOB: 11/20/1945 MRN: [id number]   Date of Service  01/05/2017  HPI/Events of Note  Abdomen US reviewed with radiologist- Complex fluid collection near the spleen of unclear significance  eICU Interventions  Order CT abd/pelvis without contrast     Intervention Category Minor Interventions: Other:  Carrah Eppolito 01/05/2017, 12:51 AM

## 2017-01-05 NOTE — Discharge Summary (Signed)
Name: Ross Bauer MRN: [id number] DOB: Jul 15, 1945 71 y.o. PCP: Doneen Poisson, MD  Date of Admission: 01/02/2017  3:27 AM Date of Discharge: 01/17/2016 Attending Physician: Levert Feinstein, MD  Discharge Diagnosis: 1. Multifocal PNA  2. Respiratory arrest  3. Acute on chronic anemia  4. Acute on CKD 5. Enterobacter UTI  6. HHS  7. Dysphagia   Principal Problem:   Anemia in chronic kidney disease Active Problems:   Urinary tract infection without hematuria   Type 2 diabetes mellitus with hyperosmolar nonketotic hyperglycemia (HCC)   Multifocal pneumonia   Protein-calorie malnutrition, severe   Respiratory arrest (HCC)   Bradycardia   Acute respiratory failure with hypoxia (HCC)   Shock (HCC)   Acute on chronic renal insufficiency   CKD (chronic kidney disease) stage 4, GFR 15-29 ml/min (HCC)   Occult blood positive stool   Gastric and duodenal angiodysplasia   Other fluid overload  Discharge Medications: Allergies as of 01/16/2017      Reactions   Ace Inhibitors    REACTION: intolerance      Medication List    STOP taking these medications   carvedilol 25 MG tablet Commonly known as:  COREG   cloNIDine 0.1 MG tablet Commonly known as:  CATAPRES   terazosin 2 MG capsule Commonly known as:  HYTRIN     TAKE these medications   albuterol 108 (90 Base) MCG/ACT inhaler Commonly known as:  PROVENTIL HFA;VENTOLIN HFA Inhale 2 puffs into the lungs every 6 (six) hours as needed for wheezing or shortness of breath.   amLODipine 10 MG tablet Commonly known as:  NORVASC Take 1 tablet (10 mg total) daily by mouth.   aspirin 81 MG tablet Take 1 tablet (81 mg total) by mouth daily.   atorvastatin 80 MG tablet Commonly known as:  LIPITOR Take 1 tablet (80 mg total) by mouth daily at 6 PM.   Darbepoetin Alfa 200 MCG/0.4ML Sosy injection Commonly known as:  ARANESP Inject 0.4 mLs (200 mcg total) into the skin every Sunday at 6pm.   ferrous sulfate 325  (65 FE) MG tablet Take 1 tablet (325 mg total) by mouth daily with breakfast.   furosemide 40 MG tablet Commonly known as:  LASIX Take 1 tablet (40 mg total) by mouth daily.   glipiZIDE 10 MG tablet Commonly known as:  GLUCOTROL Take 1 tablet (10 mg total) by mouth 2 (two) times daily before a meal.       Disposition and follow-up:   Ross Bauer was discharged from [facility] in Stable condition.  At the hospital follow up visit please address:  1.  Patient admitted for hyperosmolar hyperglycemic syndrome but had a very complicated clinical course, as he developed bradycardia, was transferred to ICU, and treated for multifocal pneumonia and a urinary tract infection. He was also found to have acute on chronic anemia, for which he received an EGD during hospitalization. Patient was deconditioned at discharge and went to SNF as a result. At time of discharge his Hgb was stable around 8 for multiple days and he was saturating well on room air. Please assess for signs and symptoms of shortness of breath. Please assess patient's compliance with medications at time of discharge.   2.  Labs / imaging needed at time of follow-up: CBC for Hgb, and BMP for Cr   3.  Pending labs/ test needing follow-up: None   Follow-up Appointments:  Contact information for follow-up providers    [place],  Lyman BishopLawrence, MD. Call in 1 week(s).   Specialty:  Internal Medicine Why:  Please see your primary care provider within 1-2 weeks of discharge. Please call the clinic to make an appointment to follow up on Monday 01/16/2017. Contact information: [address] [place]  [place] [address] [phone]        [facility]. Call in 4 week(s).   Specialty:  Nephrology Why:  Please make an appointment to follow up with the kidney doctors within 4 weeks of discharge.  Contact information: [address] [place] [address] [phone]             Contact information for after-discharge care    Destination    [facility] .   Service:  Skilled Nursing Contact information: [address] [place] [id number] [phone]                  Hospital Course by problem list: Principal Problem:   Anemia in chronic kidney disease Active Problems:   Urinary tract infection without hematuria   Type 2 diabetes mellitus with hyperosmolar nonketotic hyperglycemia (HCC)   Multifocal pneumonia   Protein-calorie malnutrition, severe   Respiratory arrest (HCC)   Bradycardia   Acute respiratory failure with hypoxia (HCC)   Shock (HCC)   Acute on chronic renal insufficiency   CKD (chronic kidney disease) stage 4, GFR 15-29 ml/min (HCC)   Occult blood positive stool   Gastric and duodenal angiodysplasia   Other fluid overload   1. Multi-focal Pneumonia: Patient presented with increased cough, tachypnea, and new oxygen requirement in the setting of recent viral illness. CXR concerning for multi-focal pneumonia secondary to aspiration given history of dysphagia. He was intially started on Azithromycin and Ceftriaxone, but continue to spike fevers and was switched from ceftriaxone to Unasyn for anaerobic coverage. Antibiotics were then switched to Levofloxacin only to cover for both Pneumonia and UTI (see below) growing enterobacter species. Blood cultures were negative. Unfortunately, patient had a respiratory arrest on the night of 12/26 and was transferred to the ICU for further care (see below). He completed a 7 day course of antibiotic therapy and respiratory status remained stable after transfer out of ICU.    2. Respiratory arrest: Patient found with agonal breathing and bradycardic with HR 30s on the night of 12/26. He was given atropine, bicarb and calcium and was transferred to the ICU. He did not require intubation. In the ICU, he was found to be profoundly anemic with Hgb 5.1 from 8.6 on admission.  Source of bleeding unclear at the time and GI consulted for further evaluation (see below). From a respiratory standpoint the patient required supplemental oxygen after transfer to the floor from the ICU. The patient was found to be fluid overloaded, likely 2/2 ESRD, towards the end of hospitalization. He was given PO lasix 80 mg x 2 days with good resolution of his hypoxia and SOB. At the time of discharge the patient was weaned to room air and saturating >90-92% with ambulation.  3. Acute on Chronic Anemia: Pt presenting with Hgb of 8.8, was previously around 11.5. Acute anemia was attributed to anemia of chronic disease and renal disease. No reported signs/symptoms of bleeding initially. During ICU stay, patient became profoundly anemic and was found to be hemoccult positive. Gastroenterology was consulted. He underwent an EGD on 01/11/17 which identified one small, nonbleeding angioectasia that was laser ablated. No other recommendations were given. Patient was started on Aranesp for  CKD by nephrology given chronic kidney disease. At the time of discharge the patient's hemoglobin was stable around 8 for >72 hours (7.8 on day of discharge, 01/14/17).   4. Acute on Chronic Kidney Disease,  Patient presented with Cr 6.0 (up from baseline of ~3.2). Likely secondary to decreased PO intake and HHS as below. K 6.6 at the time of admission and he received albuterol and insulin. He did not require dialysis. AKI improved throughout admission with IVF and Cr was 2.79 on discharge. Nephrology was involved in patient's care and they will continue to follow up in the outpatient setting.   5. UTI: On admission, U/A noted moderate leukocytes, negative nitrites, few bacteria. No complaints of dysuria. Initally covered by Ceftriaxone, which was switched to Unasyn to cover anaerobes for Pneumonia as above. Then culuture grew Enterobacter and patient was switched to Levofloxacin only for good coverage of Enterobacter and  Pneumonia. Completed antibiotic therapy during admission.   6. Dysphagia: On admission family reported concern for dysphagia, with decreased PO intake, coughing with food intake, and difficulty swallowing over the week prior to admission. His neurologic exam was without focal deficits and therefore low suspicion for acute neurologic etiology of his dysphagia. Speech evaluation and modified barium swallow performed and pateint was started on Dysphagia 1 Diet / Thin liquids.  7. HHS:  BG >1000 on presentation in the setting of medication non-adherence due to decreased PO intake. His anion gap was normal. With Hyperglycemia, type 2 DM, and elevated Serum Osm 353, this is was determined to be HHS rather than DKA. Patient improved throughout the day on Insulin gtt and was transitioned to SubQ insulin. Patient discharged with Glipizide 10 mg BID, his outpatient regimen recommended by PCP on 09/2016 visit where A1C = 6.4% after taking this regimen for a few months on regular basis.  Discharge Vitals:   BP (!) 125/46 (BP Location: Left Arm)   Pulse 93   Temp 99.2 F (37.3 C) (Oral)   Resp 16   Ht 5\' 6"  (1.676 m)   Wt 149 lb 7.6 oz (67.8 kg)   SpO2 92%   BMI 24.13 kg/m   Pertinent Labs, Studies, and Procedures:  CBC Latest Ref Rng & Units 01/16/2017 01/15/2017 01/14/2017  WBC 4.0 - 10.5 K/uL 7.2 8.4 8.1  Hemoglobin 13.0 - 17.0 g/dL 8.3(L) 8.4(L) 7.8(L)  Hematocrit 39.0 - 52.0 % 26.7(L) 26.5(L) 25.0(L)  Platelets 150 - 400 K/uL 279 280 296   BMP Latest Ref Rng & Units 01/16/2017 01/15/2017 01/14/2017  Glucose 65 - 99 mg/dL 161(W) 960(A) 84  BUN 6 - 20 mg/dL 54(U) 98(J) 19(J)  Creatinine 0.61 - 1.24 mg/dL 4.78(G) 9.56(O) 1.30(Q)  BUN/Creat Ratio 10 - 24 - - -  Sodium 135 - 145 mmol/L 131(L) 130(L) 132(L)  Potassium 3.5 - 5.1 mmol/L 3.4(L) 3.6 3.7  Chloride 101 - 111 mmol/L 97(L) 99(L) 101  CO2 22 - 32 mmol/L 25 23 24   Calcium 8.9 - 10.3 mg/dL 6.9(L) 7.0(L) 7.2(L)   Chest Xray (01/02/17):  IMPRESSION: Bibasilar airspace opacities, right greater than left, raise concern for multifocal pneumonia.  Modified barium Swallow Study (01/03/17): Prolonged mastication with solid texture moving bolus posteriorally and anteriorally prior to transition to oropharynx. Once into pharynx, pharyngeal constriction, hyolaryngeal elevation and excursion, epiglottic deflection was functional without penetration or aspiration and no significant residue. Increased work of breathing present. SLP suspects pt's impaired/prolonged mastication leads to dyspnea/fatigue and eventual expectoration, therefore recommend he continue puree and thin liquids. Esophagus scanned  which was unremarkable. Recommend pt take rest breaks during meals.   Urinalysis (01/02/17):    Component Value Date/Time   COLORURINE STRAW (A) 01/02/2017 0540   APPEARANCEUR HAZY (A) 01/02/2017 0540   LABSPEC 1.010 01/02/2017 0540   PHURINE 5.0 01/02/2017 0540   GLUCOSEU >=500 (A) 01/02/2017 0540   GLUCOSEU NEG mg/dL 98/11/914707/22/2011 82952042   HGBUR MODERATE (A) 01/02/2017 0540   BILIRUBINUR NEGATIVE 01/02/2017 0540   KETONESUR 5 (A) 01/02/2017 0540   PROTEINUR NEGATIVE 01/02/2017 0540   UROBILINOGEN 0.2 08/28/2013 0928   NITRITE NEGATIVE 01/02/2017 0540   LEUKOCYTESUR MODERATE (A) 01/02/2017 0540   Urine Culture (01/05/2017): Culture, Urine [[id number]] (Abnormal)  Collected: 01/02/17 0540  Order Status: Completed Specimen: Urine, Catheterized Updated: 01/05/17 0842   Specimen Description URINE, CATHETERIZED   Special Requests NONE   Culture >=100,000 COLONIES/mL ENTEROBACTER SPECIES Abnormal    Report Status 01/05/2017 FINAL   Organism ID, Bacteria ENTEROBACTER SPECIES Abnormal   Susceptibility   Enterobacter species ([id number])   Antibiotic Interpretation Microscan Method Status   CEFAZOLIN Resistant >=64 RESISTANT MIC Final   CEFTRIAXONE Sensitive <=1 SENSITIVE MIC Final   CIPROFLOXACIN Sensitive <=0.25 SENSITIVE MIC Final    GENTAMICIN Sensitive <=1 SENSITIVE MIC Final   IMIPENEM Sensitive 1 SENSITIVE MIC Final   NITROFURANTOIN Intermediate 64 INTERMEDIATE MIC Final   TRIMETH/SULFA Sensitive <=20 SENSITIVE MIC Final   PIP/TAZO Sensitive <=4 SENSITIVE MIC Final   Susceptibility Comments   >=100,000 COLONIES/mL ENTEROBACTER SPECIES        Discharge Instructions: Discharge Instructions    Call MD for:  persistant dizziness or light-headedness   Complete by:  As directed    Call MD for:  persistant nausea and vomiting   Complete by:  As directed    Call MD for:  redness, tenderness, or signs of infection (pain, swelling, redness, odor or green/yellow discharge around incision site)   Complete by:  As directed    Call MD for:  temperature >100.4   Complete by:  As directed    Diet - low sodium heart healthy   Complete by:  As directed    Discharge instructions   Complete by:  As directed    Please follow up with your primary care physician regarding your hospitalization. During your hospitalization you were treated for pneumonia and a urinary tract infection, both of which had resolved on discharge. You were evaluated by the kidney doctors during your hospitalization as well. These doctors will follow up with you as an outpatient regarding your chronic kidney disease. Please use the numbers provided on this paper work to schedule follow up with your primary care physician and your kidney doctors after you leave the hospital.   Increase activity slowly   Complete by:  As directed       Signed: Rozann LeschesNedrud, Hanalei Glace, MD 01/16/2017, 12:26 PM

## 2017-01-05 NOTE — Progress Notes (Signed)
Critical care attending progress note:  Reason for admission: Altered mental status secondary to hyperglycemic nonketotic state.  Noted to be short of breath and diagnosed with multifocal pneumonia.  Past medical history: CKD IV, DM, HTN, HLD, chronic anemia, BPH   Admission history and course in hospital: Admitted on 12/24, given IV fluids and insulin infusion.  Started on antibiotics for CAP.  Significant 24-hour events: Had been doing well with decreased dyspnea yesterday.  Suffered bradycardic arrest with brief CPR after ambulating to bathroom in the evening.  Rapid recovery of pulse and mentation.  Found to have significant anemia with hemoglobin of 5.5 transfused 2 units PRBC.  On rounds today:  Principal condition limiting discharge: High oxygen requirements and dyspnea  Principal safety issue: At risk for delirium, falls.  CNS: Alert and oriented.  Speech is fluent.  No focal motor deficits. Sedation vacation with dose reduction as appropriate: On no sedatives.  Respiratory: Appears cachectic with significant hyperinflation on chest examination with accessory muscle use.  Pursed lip breathing.  Tracheal tug positive.  Decreased breath sounds throughout with crackles at both bases.  No wheezes or rhonchi.  On 4 L nasal cannula. Chest x-ray and CT chest show bibasilar pneumonia with significant COPD with bullous emphysema. -CAP on a background of previously undiagnosed severe COPD.  Respiratory function will recover slowly.  Continue current antibiotics.  Progressive ambulation as tolerated.  Will likely need home oxygen. VAP bundle: Not applicable Appropriate for SBT: Currently extubated.  Cardiovascular: Normotensive now off levofed.  In sinus rhythm.  EKG shows normal sinus rhythm with no abnormalities.  Telemetry review reveals no episodes of bradycardia below 50 bpm, but patient experienced multiple episodes of desaturation, which likely explain his bradycardic arrest.   Bedside echocardiogram shows normal LV systolic function but possible RV dilatation.  On examination: JVP is not elevated and there is no peripheral edema.  Heart sounds are unremarkable. -Formal cardiac echo to rule out cor pulmonale.  GI/nutrition: Denies nausea vomiting or diarrhea.  Hemoglobin was 8.4 on admission prior to rehydration.  Nadir was 5.1 with an appropriate response following transfusion.  Iron studies not consistent with IDA -We will defer GI investigation for anemia until respiratory status improves Stress ulcer prophylaxis: Oral protonix Laxative regimen: Stooling normally Enteral nutrition: Soft diet  Renal: It does not appear volume overloaded.  Creatinine 3.5 (known CKD) mild hyperchloremic acidosis from prior fluid resuscitation. -Allow volume autoregulation  Hematology: Known anemia of chronic disease and renal insufficiency.  Sudden drop likely secondary to rehydration.  Appropriate response to transfusion will observe for now. DVT prophylaxis: SCDs.  If hemoglobin remains stable we will introduce chemical prophylaxis Transfusion indications: No.  Infectious diseases: Community-acquired pneumonia. Enterobacter UTI sensitive to fluoroquinolones Antibiotic de-escalation: Complete 7 days of Levaquin  Endocrine: On sulfonylurea at home.  Will switch insulin to mealtime dosing. Glycemic target: 80-180  Disposition: Remain in ICU due to dyspnea.    Services provided include examination of the patient, review of relevant ancillary tests, prescription of lifesaving therapies, review of medications and prophylactic therapy and multidisciplinary rounding.

## 2017-01-05 NOTE — Clinical Social Work Note (Signed)
Clinical Social Work Assessment  Patient Details  Name: Ross Bauer MRN: [id number] Date of Birth: 08/12/1945  Date of referral:  01/05/17               Reason for consult:  Facility Placement                Permission sought to share information with:  Family Supports Permission granted to share information::  Yes, Verbal Permission Granted  Name::     Grandduaghter at bedside.   Agency::     Relationship::     Contact Information:     Housing/Transportation Living arrangements for the past 2 months:  Apartment Source of Information:  Patient Patient Interpreter Needed:  None Criminal Activity/Legal Involvement Pertinent to Current Situation/Hospitalization:  No - Comment as needed Significant Relationships:  Adult Children, Other Family Members Lives with:  Self Do you feel safe going back to the place where you live?  No Need for family participation in patient care:  Yes (Comment)  Care giving concerns:  CSW spoke with pt at bedside. During this time pt expressed no concerns to CSW. CSW also spoke with pt's granddaughter at bedside and was informed that she as well as other family members are concerned about pt being at home alone. They are looking for a place that is either Independent living or assisted living for long term care.    Social Worker assessment / plan:  CSW spoke with pt and granddaughter at bedside. During this time CSW was informed that pt is from home alone. Pt reports that pt has support and help from family members nut sometimes need a little more help. Pt is agreeable to SNF placement for rehab at this time.   Employment status:  Retired Database administratornsurance information:  Managed Medicare PT Recommendations:  Not assessed at this time Information / Referral to community resources:  Skilled Nursing Facility  Patient/Family's Response to care:  Pt and family appeared to be understanding and agreeable to plan of care at this time.   Patient/Family's Understanding  of and Emotional Response to Diagnosis, Current Treatment, and Prognosis:  No further questions or concerns have been presented to CSW at this time.   Emotional Assessment Appearance:  Appears stated age Attitude/Demeanor/Rapport:    Affect (typically observed):  Adaptable, Calm, Pleasant Orientation:  Oriented to Self, Oriented to Situation, Oriented to Place, Oriented to  Time Alcohol / Substance use:  Not Applicable Psych involvement (Current and /or in the community):  No (Comment)  Discharge Needs  Concerns to be addressed:  Care Coordination Readmission within the last 30 days:  No Current discharge risk:  Lives alone Barriers to Discharge:  No Barriers Identified   Robb MatarKierra S Markeshia Giebel, LCSWA 01/05/2017, 10:18 AM

## 2017-01-06 ENCOUNTER — Encounter (HOSPITAL_COMMUNITY): Payer: Self-pay | Admitting: Physician Assistant

## 2017-01-06 ENCOUNTER — Inpatient Hospital Stay (HOSPITAL_COMMUNITY): Payer: Medicare HMO

## 2017-01-06 DIAGNOSIS — J441 Chronic obstructive pulmonary disease with (acute) exacerbation: Secondary | ICD-10-CM

## 2017-01-06 DIAGNOSIS — R1310 Dysphagia, unspecified: Secondary | ICD-10-CM

## 2017-01-06 DIAGNOSIS — K922 Gastrointestinal hemorrhage, unspecified: Secondary | ICD-10-CM

## 2017-01-06 DIAGNOSIS — J9621 Acute and chronic respiratory failure with hypoxia: Secondary | ICD-10-CM

## 2017-01-06 DIAGNOSIS — D649 Anemia, unspecified: Secondary | ICD-10-CM

## 2017-01-06 DIAGNOSIS — Z7189 Other specified counseling: Secondary | ICD-10-CM

## 2017-01-06 DIAGNOSIS — J189 Pneumonia, unspecified organism: Secondary | ICD-10-CM

## 2017-01-06 LAB — CBC
HEMATOCRIT: 22.1 % — AB (ref 39.0–52.0)
Hemoglobin: 7.2 g/dL — ABNORMAL LOW (ref 13.0–17.0)
MCH: 29 pg (ref 26.0–34.0)
MCHC: 32.6 g/dL (ref 30.0–36.0)
MCV: 89.1 fL (ref 78.0–100.0)
PLATELETS: 206 10*3/uL (ref 150–400)
RBC: 2.48 MIL/uL — ABNORMAL LOW (ref 4.22–5.81)
RDW: 16.4 % — AB (ref 11.5–15.5)
WBC: 8.6 10*3/uL (ref 4.0–10.5)

## 2017-01-06 LAB — GLUCOSE, CAPILLARY
GLUCOSE-CAPILLARY: 220 mg/dL — AB (ref 65–99)
GLUCOSE-CAPILLARY: 223 mg/dL — AB (ref 65–99)
GLUCOSE-CAPILLARY: 243 mg/dL — AB (ref 65–99)
GLUCOSE-CAPILLARY: 211 mg/dL — AB (ref 65–99)
Glucose-Capillary: 240 mg/dL — ABNORMAL HIGH (ref 65–99)
Glucose-Capillary: 197 mg/dL — ABNORMAL HIGH (ref 65–99)

## 2017-01-06 LAB — BASIC METABOLIC PANEL
ANION GAP: 8 (ref 5–15)
BUN: 74 mg/dL — ABNORMAL HIGH (ref 6–20)
CALCIUM: 7.1 mg/dL — AB (ref 8.9–10.3)
CO2: 16 mmol/L — ABNORMAL LOW (ref 22–32)
Chloride: 107 mmol/L (ref 101–111)
Creatinine, Ser: 3.81 mg/dL — ABNORMAL HIGH (ref 0.61–1.24)
GFR, EST AFRICAN AMERICAN: 17 mL/min — AB (ref >60)
GFR, EST NON AFRICAN AMERICAN: 14 mL/min — AB (ref >60)
Glucose, Bld: 227 mg/dL — ABNORMAL HIGH (ref 65–99)
Potassium: 3.8 mmol/L (ref 3.5–5.1)
Sodium: 131 mmol/L — ABNORMAL LOW (ref 135–145)

## 2017-01-06 LAB — MRSA PCR SCREENING: MRSA BY PCR: NEGATIVE

## 2017-01-06 LAB — OCCULT BLOOD X 1 CARD TO LAB, STOOL: Fecal Occult Bld: POSITIVE — AB

## 2017-01-06 MED ORDER — PREMIER PROTEIN SHAKE
11.0000 [oz_av] | Freq: Two times a day (BID) | ORAL | Status: DC
  Administered 2017-01-06 – 2017-01-09 (×6): 11 [oz_av] via ORAL
  Filled 2017-01-06 (×16): qty 325.31

## 2017-01-06 MED ORDER — DOCUSATE SODIUM 100 MG PO CAPS
100.0000 mg | ORAL_CAPSULE | Freq: Two times a day (BID) | ORAL | Status: DC | PRN
  Filled 2017-01-06: qty 1

## 2017-01-06 MED FILL — Medication: Qty: 1 | Status: AC

## 2017-01-06 NOTE — Progress Notes (Signed)
  Echocardiogram 2D Echocardiogram has been performed.  Delcie RochENNINGTON, Laylani Pudwill 01/06/2017, 2:10 PM

## 2017-01-06 NOTE — Progress Notes (Signed)
Results for Virgel ManifoldDOBBINS, Umer L (MRN [id number]) as of 01/06/2017 15:59  Ref. Range 01/05/2017 23:30 01/06/2017 03:17 01/06/2017 08:22 01/06/2017 11:48 01/06/2017 15:48  Glucose-Capillary Latest Ref Range: 65 - 99 mg/dL 409261 (H) 811220 (H) 914223 (H) 240 (H) 243 (H)  Noted that blood sugars greater than 180 mg/dl.  Recommend increasing Lantus to 15 units daily if blood sugars continue to be elevated.   Smith MinceKendra Neenah Canter RN BSN CDE Diabetes Coordinator Pager: [phone]  8am-5pm

## 2017-01-06 NOTE — Progress Notes (Signed)
  Speech Language Pathology Treatment: Dysphagia  Patient Details Name: Ross Bauer MRN: [id number] DOB: 01/03/1946 Today's Date: 01/06/2017 Time: [id number] SLP Time Calculation (min) (ACUTE ONLY): 15 min  Assessment / Plan / Recommendation Clinical Impression  Pt seen for safety with recommended diet and possibility for texture upgrade. Oral dysphagia present during MBS with functional pharyngeal phase. Since MBS pt transferred to ICU due to respiratory difficulty- not requiring intubation. Pt alert and requesting fried fish. Today pt's mastication and transit significantly faster and more efficient than during MBS. Delayed cough x 1 not related to one consistency and pt coughs frequently throughout session and study. Educated pt and son/granddaughter (or dtr?) re: importance of pausing to facilitate breathing with pt's dyspnea and normal apneic period during swallows. Discussed various textures and agreed upon Dys 3, continue thin and upright posture (SLP re-entered room and pt drinking in more reclined position than safe with son at bedside). Will continue to follow.    HPI HPI: 71 [facility] 2 DM with hypersomolar nonketotic hyperglycemia and found to have pna.  PMH + for DM, confusion, poor po intake, weakness, rapid response called with pt with RR 30s to 40s, 90s oxygen saturation on 5 liters oxygen.  Per family member in room, pt with poor intake prior to admit and episode of vertigo a few weeks ago.  Per daughter, pt refused to go to MD with vertigo episode.  He also had dysphagia prior to admit with symptoms including sensation of residuals in pharynx, oral holding and bringing food/drink up after intake.  Today pt is fully alert with clear voice. [name].        SLP Plan  Continue with current plan of care       Recommendations  Diet recommendations: Dysphagia 3 (mechanical soft);Thin liquid Liquids provided via: Cup;Straw Medication Administration: Whole meds with  puree Supervision: Patient able to self feed;Full supervision/cueing for compensatory strategies Compensations: Slow rate;Small sips/bites Postural Changes and/or Swallow Maneuvers: Seated upright 90 degrees                Oral Care Recommendations: Oral care BID Follow up Recommendations: Other (comment)(TBD) SLP Visit Diagnosis: Dysphagia, oral phase (R13.11) Plan: Continue with current plan of care       GO                Royce MacadamiaLitaker, Karah Caruthers Willis 01/06/2017, 12:01 PM  Breck CoonsLisa Willis Lonell FaceLitaker M.Ed [employer]-SLP Pager [phone]

## 2017-01-06 NOTE — Care Management Note (Signed)
Case Management Note  Patient Details  Name: Ross Bauer MRN: [id number] Date of Birth: 10/09/1945  Subjective/Objective: Pt admitted on 01/02/17 with multifocal PNA.  PTA, pt resided at home with son.                     Action/Plan: Would recommend PT/OT evaluations when able to tolerate therapies.  Will follow for discharge planning as pt progresses.    Expected Discharge Date:                  Expected Discharge Plan:     In-House Referral:     Discharge planning Services  CM Consult  Post Acute Care Choice:    Choice offered to:     DME Arranged:    DME Agency:     HH Arranged:    HH Agency:     Status of Service:  In process, will continue to follow  If discussed at Long Length of Stay Meetings, dates discussed:    Additional Comments:  Glennon Macmerson, Durk Carmen M, RN 01/06/2017, 4:30 PM

## 2017-01-06 NOTE — Consult Note (Signed)
[facility] Consult: 10:13 AM 01/06/2017  LOS: 4 days    Referring Provider: Dr Denese Killings  Primary Care Physician:  Doneen Poisson, MD Primary Gastroenterologist:  unassigned    Reason for Consultation:  Acute anemia   HPI: Ross Bauer is a 71 y.o. male.  PMH COPD, emphysema and ongoing 1.5 PPD habit.  NIDDM.  CKD stage 4.  Anemia of CKD.  BPH.  Remote heavy ETOH.     No previous EGD or colonoscopy.  01/02/17  admission with LLL CAP, enterobacter UTI, HHS (hyperglycemic hyperosmolar syndrome), AKI Syncopal, bradycardic to 30s on 12/26 after return from the toilet.  Transferred to ICU.  Improved with atropine, bicarb, calcium.  Received 1.7 liters of IVF that day.  Hgbs 8.6 >> 8.1 >> 7.3 >> 5.1 (12/26) (2 U PRBC) >> 8.4 >> 7.2 (today).  PT/INR normal. Hgb was 11.5 in  02/2014. Ferritin 593, low iron and low TIBC Stool FOBT testing not yet submitted.     Ultrasound abd 12/26: Complex 8 cm collection of fluid superior and medial to the spleen, of uncertain significance; may reflect stomach.  Mild fatty liver.      CT abdomen/Pelvis wo contrast 12/27: no bleeding or hematoma.  ? SB and prox colon ileus. Fluid collection medial to spleen likely reflects portion of stomach.    Most of history is obtained from granddaughter sitting at bedside.  3 weeks ago the patient took a fall at home, did not sustain any injuries, but after that developed dysphagia.  Vomiting when he swallowed.  Family started pureing his food.[name]  He was not consuming much with sometimes difficulties swallowing.  Also since the fall he has had a sharp decline, staying in bed and generally failing to thrive. Prior to the fall, patient has not been eating well at all.  He has had significant weight loss of undetermined  amount. MBS per SLP 12/27: suspects pt's impaired/prolonged mastication leads to dyspnea/fatigue and eventual expectoration, therefore recommend he continue puree and thin liquids. Esophagus scanned which was unremarkable.    Stools are dark brown but the granddaughter says they are never bloody or black or tarry.  Denies heartburn.  Denies abdominal pain.  Generally constipated, moves his bowels ~ 2 x per week.  He takes 81 mg aspirin daily but no additional aspirin and no NSAIDs.  Patient has not drunk any alcohol for over 40 years.  No history of any kind of liver injury or problems.    [name] follows his CKD.  Has not been receiving EPO or iron supplements.  He denies previous issues with anemia and denies previous blood transfusions.  Has not had any unusual or excessive bleeding or bruising. Family history negative for anemia, ulcer disease, colon cancer.   Past Medical History:  Diagnosis Date  . Anemia in chronic kidney disease 06/19/2009  . Background diabetic retinopathy associated with type 2 diabetes mellitus (HCC) 11/13/2013   Bilateral. Followed by ophthalmologist [name].  [name] BPH (benign prostatic hyperplasia) 09/30/2016  . Chronic back pain 05/09/2007   "  all over" (01/02/2017)  . Essential hypertension   . Hyperlipidemia   . Onychomycosis of right great toe 06/01/2015  . Pneumonia   . 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.  . Tobacco abuse 03/20/2007  . Type 2 diabetes mellitus with stage 4 chronic kidney disease (HCC)   . 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 NO PAST SURGERIES      Prior to Admission medications   Medication Sig Start Date End Date Taking? Authorizing Provider  amLODipine (NORVASC) 10 MG tablet Take 1 tablet (10 mg total) daily by mouth. 11/16/16  Yes Doneen Poisson,  MD  aspirin 81 MG tablet Take 1 tablet (81 mg total) by mouth daily. 04/30/13  Yes Emokpae, Ejiroghene E, MD  atorvastatin (LIPITOR) 80 MG tablet Take 1 tablet (80 mg total) by mouth daily at 6 PM. 03/04/16  Yes Doneen Poisson, MD  carvedilol (COREG) 25 MG tablet Take 1 tablet (25 mg total) by mouth 2 (two) times daily with a meal. 12/09/15  Yes Doneen Poisson, MD  cloNIDine (CATAPRES) 0.1 MG tablet Take 1 tablet (0.1 mg total) by mouth every 12 (twelve) hours as needed. Patient taking differently: Take 0.1 mg by mouth every 12 (twelve) hours as needed (hypertension).  03/04/16  Yes Doneen Poisson, MD  furosemide (LASIX) 40 MG tablet Take 1 tablet (40 mg total) by mouth daily. 05/29/15  Yes Courtney Paris, MD  glipiZIDE (GLUCOTROL) 10 MG tablet Take 1 tablet (10 mg total) by mouth 2 (two) times daily before a meal. 03/04/16  Yes Doneen Poisson, MD  terazosin (HYTRIN) 2 MG capsule Take 1 capsule (2 mg total) by mouth at bedtime. Patient not taking: Reported on 01/02/2017 09/30/16   Doneen Poisson, MD    Scheduled Meds: . atorvastatin  80 mg Oral q1800  . docusate sodium  100 mg Oral BID  . insulin aspart  0-9 Units Subcutaneous Q4H  . insulin glargine  10 Units Subcutaneous Daily  . levofloxacin  500 mg Oral Q48H  . mouth rinse  15 mL Mouth Rinse BID  . pantoprazole  40 mg Oral Daily  . protein supplement shake  11 oz Oral BID BM   Infusions: . sodium chloride     PRN Meds: acetaminophen **OR** acetaminophen, ondansetron **OR** ondansetron (ZOFRAN) IV   Allergies as of 01/02/2017 - Review Complete 01/02/2017  Allergen Reaction Noted  . Ace inhibitors      Family History  Problem Relation Age of Onset  . Cancer Mother        died in 55s.  . Cancer Father        died in 28s  . Healthy Sister   . Healthy Brother   . Unexplained death Brother   . Healthy Daughter   . Healthy Daughter     Social History   Socioeconomic History  . Marital status: Divorced    Spouse name: Not  on file  . Number of children: 2  . Years of education: Not on file  . Highest education level: Not on file  Social Needs  . Financial resource strain: Not on file  . Food insecurity - worry: Not on file  . Food insecurity - inability: Not on file  . Transportation needs - medical: Not on file  . Transportation needs - non-medical: Not on file  Occupational History  .  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 Smoking status: Current Every Day Smoker    Packs/day: 0.12    Years: 50.00    Pack years: 6.00    Types: Cigarettes  . Smokeless tobacco: Never Used  Substance and Sexual Activity  . Alcohol use: No    Alcohol/week: 0.0 oz  . Drug use: No  . Sexual activity: No  Other Topics Concern  . Not on file  Social History Narrative   Limited finances for which he frequently skips medications for a month or two.            REVIEW OF SYSTEMS: Constitutional:  Per HPI ENT:  No nose bleeds Pulm:  Per HPI CV:  No palpitations, no CP.   Slight LE swelling GU:  No hematuria, no frequency GI:  Per HPI Heme:  Per HPI   Transfusions:  Per HPI Neuro:  No headaches, no peripheral tingling or numbness Derm:  No itching, no rash or sores.  Endocrine:  No sweats or chills.  No polyuria or dysuria Immunization: Patient refuses all vaccinations. Travel:  None beyond local counties in last few months.    PHYSICAL EXAM: Vital signs in last 24 hours: Vitals:   01/06/17 0700 01/06/17 0800  BP: 114/62 123/62  Pulse: 95 87  Resp: (!) 24 (!) 28  Temp:    SpO2: 99% 97%   Wt Readings from Last 3 Encounters:  01/04/17 61.5 kg (135 lb 9.3 oz)  09/30/16 70.3 kg (154 lb 14.4 oz)  06/03/16 69.1 kg (152 lb 4.8 oz)    General: Chronically and acutely ill looking, cachectic AAM. Alert and conversational - short of breath. Head: No facial asymmetry or swelling.  No signs of head trauma. Eyes: No scleral icterus.   Erythematous slightly everted sclera Ears: Slight HOH Nose: No discharge or congestion Mouth: Edentulous.  Pink, moist, clear oral mucosa.  Tongue midline. Neck: No JVD.  No masses.  No thyromegaly. Lungs: Fine rales at the bases bilaterally.  No dyspnea at rest.  No cough..  Diminished breath sounds left base.  Bowel sounds transmitted to left chest Heart: RRR.  No MRG.  S1, S2 present Abdomen: Thin.  Not tender.  No masses.  No HSM.[name].   Rectal: Deferred rectal exam. Musc/Skeltl: No gross joint deformities.  Some arthritic changes in the fingers. Extremities: 1+ pedal edema.  Nonpitting in the upper extremities. Neurologic: Alert.  Appropriate.  Follows commands.   Skin: No obvious sores or rashes. Tattoos: None observed. Nodes: No cervical adenopathy. Psych: Pleasant, affect is subdued.  Calm.  Intake/Output from previous day: 12/27 0701 - 12/28 0700 In: 801.5 [P.O.:580; I.V.:221.5] Out: 1595 [Urine:1595] Intake/Output this shift: No intake/output data recorded.  LAB RESULTS: Recent Labs    01/04/17 1828 01/05/17 0249 01/06/17 0322  WBC 9.8 12.4* 8.6  HGB 5.1* 8.4* 7.2*  HCT 16.4* 25.8* 22.1*  PLT 221 217 206   BMET Lab Results  Component Value Date   NA 131 (L) 01/06/2017   NA 136 01/05/2017   NA 138 01/04/2017   K 3.8 01/06/2017   K 4.1 01/05/2017   K 4.0 01/04/2017   CL 107 01/06/2017   CL 111 01/05/2017   CL 115 (H) 01/04/2017   CO2 16 (L) 01/06/2017   CO2 18 (L) 01/05/2017   CO2 16 (L) 01/04/2017   GLUCOSE 227 (H) 01/06/2017   GLUCOSE 209 (  H) 01/05/2017   GLUCOSE 175 (H) 01/04/2017   BUN 74 (H) 01/06/2017   BUN 74 (H) 01/05/2017   BUN 82 (H) 01/04/2017   CREATININE 3.81 (H) 01/06/2017   CREATININE 3.58 (H) 01/05/2017   CREATININE 4.08 (H) 01/04/2017   CALCIUM 7.1 (L) 01/06/2017   CALCIUM 7.4 (L) 01/05/2017   CALCIUM 7.5 (L) 01/04/2017   LFT No results for input(s): PROT, ALBUMIN, AST, ALT, ALKPHOS, BILITOT, BILIDIR, IBILI in the last 72  hours. PT/INR Lab Results  Component Value Date   INR 1.27 01/04/2017   Hepatitis Panel No results for input(s): HEPBSAG, HCVAB, HEPAIGM, HEPBIGM in the last 72 hours. C-Diff No components found for: CDIFF Lipase  No results found for: LIPASE  Drugs of Abuse  No results found for: LABOPIA, COCAINSCRNUR, LABBENZ, AMPHETMU, THCU, LABBARB   RADIOLOGY STUDIES: Ct Abdomen Pelvis Wo Contrast  Result Date: 01/05/2017 CLINICAL DATA:  71 year old male with abdominal distention. Possible hemorrhagic shock. Possible complex fluid collection adjacent to the spleen on abdominal ultrasound. Admitted with altered mental status, weakness, hyperglycemia, acute on chronic renal insufficiency, dehydration. EXAM: CT ABDOMEN AND PELVIS WITHOUT CONTRAST TECHNIQUE: Multidetector CT imaging of the abdomen and pelvis was performed following the standard protocol without IV contrast. COMPARISON:  Ultrasound 01/04/2017 at 2121 hours. Portable chest radiograph 01/02/2017. FINDINGS: Lower chest: Small to moderate-sized layering pleural effusions. No pericardial effusion. Cystic lung disease at both lung bases with a background pattern of ground-glass opacity, but superimposed irregular bilateral lower lobe consolidation. Early involvement of the lateral segment of the right middle lobe. Air bronchograms common no definite cavitary changes. Hepatobiliary: The gallbladder is diminutive. Negative noncontrast liver aside from atherosclerotic appearing calcifications at the porta hepatis. Pancreas: Negative. Spleen: Negative spleen. No perisplenic fluid collection. Just superior to the spleen the abnormal left lung base fluid and consolidation is noted, and fluid plus loculated contrast is present in the gastric cardia medial to the spleen. Adrenals/Urinary Tract: Normal adrenal glands. Occasional small intrarenal vascular calcifications. No definite nephrolithiasis. No hydronephrosis. Mild bilateral perinephric stranding which  may be related to chronic kidney disease. Negative course of the left ureter. Negative course of the right ureter. The urinary bladder is mildly distended but otherwise negative. Several bilateral pelvic phleboliths are noted. Stomach/Bowel: Oral contrast throughout the large bowel to the rectum. Decompressed distal colon with no definite wall thickening. Occasional diverticula. Normal splenic flexure. Mild gaseous distension of the transverse colon which also contains contrast. Mild contrast distension of the right colon. Negative terminal ileum. Appendix is diminutive or absent. Most small bowel loops are un opacified with mild gaseous distension. There is some oral contrast in distal small bowel. In the left upper quadrant the stomach contains a combination of fluid, gas, and loculated contrast in the fundus near the spleen. No pneumoperitoneum. No free fluid identified. No extravasated contrast. The duodenum is mildly gas distended. Vascular/Lymphatic: Aortoiliac calcified atherosclerosis. Vascular patency is not evaluated in the absence of IV contrast. No lymphadenopathy. 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 The scrotum is not included. Other: No pelvic free fluid. Musculoskeletal: Heterogeneous bone mineralization with mild diffuse sclerosis. Probable superimposed degenerative subchondral cysts in lower lumbar vertebrae. Lower lumbar vacuum disc. No acute osseous abnormality identified. IMPRESSION: 1. Negative for hemorrhage in the abdomen or pelvis. No fluid collection, free fluid or free air. The fluid collection medial to the spleen on the recent ultrasound was likely a portion  of the stomach. 2. Bilateral lower lobe and early right middle lobe Pneumonia superimposed on bilateral cystic lung disease. Bilateral small to moderate layering pleural effusions. 3. Mild gaseous and contrast distention of small  bowel and the proximal colon might reflect ileus. Oral contrast has reached the rectum without evidence of obstruction. 4. 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 Recommend follow-up Scrotal Ultrasound when feasible. Preliminary report of #1 provided by Dr. Tonia GhentJeffrey Chang at [phone] to the charge [id number] Electronically Signed   By: Odessa FlemingH  Hall M.D.   On: 01/05/2017 06:54   [place] Abdomen Complete  Result Date: 01/04/2017 CLINICAL DATA:  Suspected hemorrhagic shock. Assess for bleeding. Assess for abdominal aortic aneurysm. EXAM: ABDOMEN ULTRASOUND COMPLETE COMPARISON:  Renal ultrasound performed 04/23/2012 FINDINGS: Gallbladder: No gallstones or wall thickening visualized. No sonographic Murphy sign noted by sonographer. Common bile duct: Diameter: 0.6 cm, within normal limits in caliber. Liver: No focal lesion identified. Mildly coarsened echotexture may reflect mild fatty infiltration. Portal vein is patent on color Doppler imaging with normal direction of blood flow towards the liver. IVC: No abnormality visualized. Pancreas: Not well characterized due to overlying bowel gas. Spleen: Size and appearance within normal limits. There appears to be a complex 8 cm collection of fluid superior and medial to the spleen, of uncertain significance. Right Kidney: Length: 10.2 cm. Not well characterized due to overlying bowel gas. Echogenicity within normal limits. A 1.0 cm right renal cyst is noted. No hydronephrosis visualized. Left Kidney: Length: 10.5 cm. Not well characterized due to overlying bowel gas. Echogenicity within normal limits. No mass or hydronephrosis visualized. Abdominal aorta: No aneurysm visualized. Measures up to 2.5 cm in AP dimension. The aortic bifurcation is not well seen. Other findings: Small bilateral pleural effusions are noted. IMPRESSION: 1. Complex 8 cm collection of fluid superior and medial to the spleen, of uncertain significance. This may simply reflect the stomach, though given clinical  concern, hemorrhage cannot be entirely excluded. CT of the abdomen and pelvis without contrast is recommended for further evaluation, given the patient's renal insufficiency. 2. Small bilateral pleural effusions noted. 3. Mild fatty infiltration within the liver. 4. Small right renal cyst. These results were called by telephone at the time of interpretation on 01/04/2017 at 10:49 pm to [facility], who verbally acknowledged these results. Electronically Signed   By: Roanna RaiderJeffery  Chang M.D.   On: 01/04/2017 22:51   Dg Swallowing Func-speech Pathology  Result Date: 01/04/2017 MBS-Modified Barium Swallow Study   CLINICAL IMPRESSIONS:Prolonged mastication with solid texture moving bolus posteriorally and anteriorally prior to transition to oropharynx. Once into pharynx, pharyngeal constriction, hyolaryngeal elevation and excursion, epiglottic deflection was functional without penetration or aspiration and no significant residue. Increased work of breathing present. SLP suspects pt's impaired/prolonged mastication leads to dyspnea/fatigue and eventual expectoration, therefore recommend he continue puree and thin liquids. Esophagus scanned which was unremarkable. Recommend pt take rest breaks during meals.   DIET RECOMMENDATION 01/04/2017 SLP Diet Recommendations Dysphagia 1 (Puree) solids;Thin liquid Liquid Administration via Straw;Cup Medication Administration Whole meds with liquid Compensations Slow rate;Small sips/bites Postural Changes Seated upright at 90 degrees. Breck CoonsLisa Willis FranklinLitaker M.Ed CCC-SLP Pager [phone]                IMPRESSION:   *  Acute on chronic anemia.  Suspect multifactorial:  Dilutional effect of IVF, baseline CKD, phlebotomy.    *  Dysphagia.  MBS mentions unremarkable scan of esophagus during MBS.  Wonder if source of PP vomiting is gastric?   *  CAP and Enterobacter UTI. Previous Rx: Zithromax, Unasyn, rocephin, IV Levaquin.  Now on oral levaquin  *  HHS, insulin drip  completed.     *  AKI, CKD previously stage 4.     PLAN:     *  EGD?, not urgent and may be best to delay this until further improvent in PNA  Pt not currently candidate for colonoscopy   Jennye Moccasin  01/06/2017, 10:13 AM Pager: [phone]  I have reviewed the entire case in detail with the above APP and discussed the plan in detail.  Therefore, I agree with the diagnoses recorded above. In addition,  I have personally interviewed and examined the patient and have personally reviewed any abdominal/pelvic CT scan images.  My additional thoughts are as follows:  Critically ill man with end stage COPD and now severe left lung PNA with respiratory distress who has acute on chronic anemia with no evidence of overt GI bleeding.  I personally reviewed the CT scan, and there is a fair amount of retained contrast in the stomach that is causing some beam hardening artifact which limits visualization.  There is no apparent gastric outlet obstruction, no gastric dilatation, no obvious mass.  No esophageal mass is seen and there is no esophageal dilatation.  I agree that his anemia appears to be multifactorial from the above-noted factors.  It should be considered that this complex appearing pneumonia could also have an underlying malignancy.  His family reports that he has had poor appetite and dysphagia since a fall a few weeks ago.  I suspect this is generalized weakness due to his acute illness.  It is possible he has an underlying mechanical lesion in the esophagus causing dysphagia, or some pathology in the stomach affecting his appetite.  However, he is in absolutely no condition for an endoscopic procedure at this point.  As such, we will check back on Monday to assess his clinical situation and revisit the need for any endoscopic procedures.  If he appears to have overt GI bleeding in the meantime, please call me.  His daughter and granddaughter are at the bedside, and they are asking at what  hemoglobin level he should be transfused.  It is not clear what transfusion parameters have been set by critical care, his hemoglobin this morning was 7.2.  Nursing indicates that the next plan was to recheck it tomorrow morning.  I have asked her to contact the critical care physician just to be certain that this is the plan.  Charlie Pitter III Pager [phone]  Mon-Fri 8a-5p [phone] after 5p, weekends, holidays

## 2017-01-06 NOTE — Progress Notes (Signed)
PULMONARY / CRITICAL CARE MEDICINE   Name: Ross Bauer MRN: [id number] DOB: 12/16/1945    ADMISSION DATE:  01/02/2017 CONSULTATION DATE: 12/26  REFERRING MD: Dr. Carlynn PurlErik Hoffman IMTS  CHIEF COMPLAINT: Syncope  HISTORY OF PRESENT ILLNESS: 71 year old male with past medical history of CKD stage IV, diabetes, hypertension, hyperlipidemia, anemia, and BPH.  He was admitted to [facility]  on 12/24 after presenting with 2 weeks of URI symptoms and progressive weakness.  He was diagnosed with what was felt to be a community-acquired pneumonia and admitted to the internal medicine teaching service.  There was also some concern for urinary tract infection.  He was treated with Levaquin.  He was also felt to have HHS secondary to his diabetes mellitus in the setting of pneumonia. He had been improving with treatment on the floor, until the evening hours of 12/26 when he suffered a syncopal episode upon returning from the rest room. HR dropped into 30s and he was minimally responsive. This improved with atropine, calcium, bicarb. He was transferred to the ICU for further monitoring.   SUBJECTIVE:  Pt reports he is "miserable".  States he used to smoke heavily (up to 1.5 ppd x40-50 years) and also drank heavily approximately 40 years ago.  He is asking for fried fish.  Denies SOB, pain, known dark / tarry stools.   VITAL SIGNS: BP (!) 113/48   Pulse 89   Temp 99.9 F (37.7 C) (Oral)   Resp (!) 27   Ht 5\' 6"  (1.676 m)   Wt 135 lb 9.3 oz (61.5 kg)   SpO2 96%   BMI 21.88 kg/m   HEMODYNAMICS:    VENTILATOR SETTINGS:    INTAKE / OUTPUT: I/O last 3 completed shifts: In: 1778 [P.O.:580; I.V.:321; Blood:877] Out: 2395 [Urine:2395]  PHYSICAL EXAMINATION: General: frail elderly male in NAD lying in bed HEENT: MM pink/moist, temporal wasting, no  jvd Neuro: AAOx4, speech clear, MAE  CV: s1s2 rrr, no m/r/g PULM: even/non-labored, lungs bilaterally distant but clear anterior, diminished  lower , barrel chest noted  WU:JWJXGI:soft, non-tender, bsx4 active  Extremities: warm/dry, no edema  Skin: no rashes or lesions  LABS:  BMET Recent Labs  Lab 01/04/17 0224 01/05/17 0249 01/06/17 0322  NA 138 136 131*  K 4.0 4.1 3.8  CL 115* 111 107  CO2 16* 18* 16*  BUN 82* 74* 74*  CREATININE 4.08* 3.58* 3.81*  GLUCOSE 175* 209* 227*    Electrolytes Recent Labs  Lab 01/02/17 0655  01/04/17 0224 01/05/17 0249 01/06/17 0322  CALCIUM 7.9*   < > 7.5* 7.4* 7.1*  MG 2.1  --   --   --   --   PHOS 4.0  --   --   --   --    < > = values in this interval not displayed.    CBC Recent Labs  Lab 01/04/17 1828 01/05/17 0249 01/06/17 0322  WBC 9.8 12.4* 8.6  HGB 5.1* 8.4* 7.2*  HCT 16.4* 25.8* 22.1*  PLT 221 217 206    Coag's Recent Labs  Lab 01/04/17 1850  INR 1.27    Sepsis Markers Recent Labs  Lab 01/02/17 0655 01/04/17 1850 01/05/17 1340  LATICACIDVEN  --  4.7* 1.4  PROCALCITON 1.55  --   --     ABG Recent Labs  Lab 01/04/17 1815  PHART 7.227*  PCO2ART 31.7*  PO2ART 62.9*    Liver Enzymes Recent Labs  Lab 01/02/17 0343  AST 29  ALT 34  ALKPHOS 116  BILITOT 1.0  ALBUMIN 1.8*    Cardiac Enzymes No results for input(s): TROPONINI, PROBNP in the last 168 hours.  Glucose Recent Labs  Lab 01/05/17 1139 01/05/17 1544 01/05/17 1923 01/05/17 2330 01/06/17 0317 01/06/17 0822  GLUCAP 111* 190* 188* 261* 220* 223*    Imaging No results found.  STUDIES:  MBS 12/26 >> oral phase dysphagia, mild aspiration risk, rec: puree and thin US ABD 12/26 >> complex 8 cm collection of fluid superior and medial to the spleen, may reflect the stomach, small bilateral effusions, mild fatty infiltration within the liver, small right renal cyst CT ABD/Pelvis 12/27 >> Negative for hemorrhage in the abdomen or pelvis. No fluid collection, free fluid or free air. The fluid collection medial to the spleen on the recent ultrasound was likely a portion of the  stomach.  Bilateral lower lobe and early right middle lobe Pneumonia superimposed on bilateral cystic lung disease. Bilateral small to moderate layering pleural effusions. Mild gaseous and contrast distention of small bowel and the proximal colon might reflect ileus. Oral contrast has reached the rectum without evidence of obstruction. 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 Recommend follow-up scrotal Ultrasound when feasible.  CULTURES: Blood culture 12/24 >>  Urine 12/24 > enterobacter >> S- rocephin, cipro imipenem  ANTIBIOTICS: Azithromycin 12/24 >12/25 Rocephin 12/24 >> 12/24  Unasyn 12/25 >> 12/25 Levaquin 12/26 >>  SIGNIFICANT EVENTS: 12/24  Admit 12/26  Syncope, icu transfer hgb drop to 5.1  LINES/TUBES:   DISCUSSION: 71 year old male admitted for CAP, Enterobacter UTI, and HHS. Was improving with ABX, insulin, hydration. 12/26 had syncopal episode with bradycardia. Hemoglobin found to be 5.1. No evidence of bleeding. He reports no clear GIB symptoms. Will give blood. DDx includes GIB and AAA.   ASSESSMENT / PLAN:  PULMONARY A: Acute hypoxemic respiratory failure CAP - LLL Severe COPD - based on imaging, no PFT's  Emphysema / Cystic Lung Disease  Tobacco Abuse  P:   O2 as needed to support sats 88-95% ABX as above  Follow CXR  Pulmonary hygiene - IS, mobilize  Will need outpatient COPD evaluation  CARDIOVASCULAR A:  Shock, suspected hypovolemic/hemorhagic  H/o HTN, HLD P:  ICU monitoring  Lactate cleared  Monitor off vasopressors  Await ECHO  Continue lipitor  RENAL A:   CKD IV - some element acute renal injury as well. Creat improving.  Hyperkalemia - improved   Hyponatremia P:   Trend BMP / urinary output Replace electrolytes as indicated Avoid nephrotoxic agents, ensure adequate renal perfusion  GASTROINTESTINAL A:   Possible GI bleeding - CKD + acute illness may explain anemia but not acute drop Dysphagia P:   Dysphagia diet per SLP  Protonix  QD  Assess hemoccult  Change colace to BID PRN given loose stools Protein supplement  GI consulted, appreciate input   HEMATOLOGIC A:   ABLA Anemia of chronic illness - baseline ~ 11, Hx of prior heavy ETOH abuse P:  Monitor CBC  Assess FOBT as above   INFECTIOUS A:   CAP UTI - enterococcus P:   Cultures as above  ABX as above  Monitor fever curve / WBC trend   ENDOCRINE A:   DM HHS vs DKA resolved P:   CBG Q4 with SSI  Lantus 10 units QD  NEUROLOGIC A:   Acute metabolic encephalopathy - resolved P:   Monitor / supportive care   FAMILY  - Updates: Patient and granddaughter updated at bedside.   - Inter-disciplinary family meet  or Palliative Care meeting due by:  12/30  Canary Brim, NP-C [facility] Pgr: [phone] or if no answer [phone] 01/06/2017, 9:20 AM  Attending Note:  71 year old male with PMH above presenting with PNA and HONK and was being treated and improving.  He went to the bathroom and had a syncopal episode.  Patient was transferred to the ICU for further monitoring.  Patient was noted to have a Hg of 5 with some melena.  On exam, distant BS.  I reviewed abdominal CT myself, severe emphysema noted.  Discussed with PCCM-NP.  Acute on chronic hypoxemic respiratory failure:  - Titrate O2 for sat of 88-92%  - Will need an ambulatory desaturation study once able to ambulate to determine home O2 need  GI Bleed:  - GI evaluated, no interventions  - No anti-coagulation  - Transfuse as needed  - CBC in AM  Hemorrhagic anemia:  - Hold anti-coagulation  - GI states not a candidate for procedures  COPD:  - Bronchodilators as ordered  - Likely ES-COPD at this point, will need discussion regarding code status.  GOC discussion:  - Full code for now, patient can not decide without speaking with his daughter.  Transfer to SDU and to IMTS with PCCM off 12/29.  Patient seen and examined, agree with above note.  I dictated  the care and orders written for this patient under my direction.  Alyson Reedy, MD [phone]

## 2017-01-07 ENCOUNTER — Inpatient Hospital Stay (HOSPITAL_COMMUNITY): Payer: Medicare HMO

## 2017-01-07 DIAGNOSIS — E43 Unspecified severe protein-calorie malnutrition: Secondary | ICD-10-CM

## 2017-01-07 DIAGNOSIS — N189 Chronic kidney disease, unspecified: Secondary | ICD-10-CM

## 2017-01-07 DIAGNOSIS — N3000 Acute cystitis without hematuria: Secondary | ICD-10-CM

## 2017-01-07 DIAGNOSIS — R739 Hyperglycemia, unspecified: Secondary | ICD-10-CM

## 2017-01-07 DIAGNOSIS — E1101 Type 2 diabetes mellitus with hyperosmolarity with coma: Secondary | ICD-10-CM

## 2017-01-07 DIAGNOSIS — N289 Disorder of kidney and ureter, unspecified: Secondary | ICD-10-CM

## 2017-01-07 DIAGNOSIS — D500 Iron deficiency anemia secondary to blood loss (chronic): Secondary | ICD-10-CM

## 2017-01-07 DIAGNOSIS — N185 Chronic kidney disease, stage 5: Secondary | ICD-10-CM

## 2017-01-07 DIAGNOSIS — R001 Bradycardia, unspecified: Secondary | ICD-10-CM

## 2017-01-07 DIAGNOSIS — R579 Shock, unspecified: Secondary | ICD-10-CM

## 2017-01-07 LAB — CBC
HEMATOCRIT: 22.3 % — AB (ref 39.0–52.0)
HEMATOCRIT: 19.2 % — AB (ref 39.0–52.0)
HEMOGLOBIN: 7.3 g/dL — AB (ref 13.0–17.0)
HEMOGLOBIN: 6.4 g/dL — AB (ref 13.0–17.0)
MCH: 29.1 pg (ref 26.0–34.0)
MCH: 29.2 pg (ref 26.0–34.0)
MCHC: 32.7 g/dL (ref 30.0–36.0)
MCHC: 33.3 g/dL (ref 30.0–36.0)
MCV: 88.8 fL (ref 78.0–100.0)
MCV: 87.7 fL (ref 78.0–100.0)
Platelets: 223 10*3/uL (ref 150–400)
Platelets: 210 10*3/uL (ref 150–400)
RBC: 2.51 MIL/uL — ABNORMAL LOW (ref 4.22–5.81)
RBC: 2.19 MIL/uL — ABNORMAL LOW (ref 4.22–5.81)
RDW: 16 % — AB (ref 11.5–15.5)
RDW: 15.6 % — AB (ref 11.5–15.5)
WBC: 8.9 10*3/uL (ref 4.0–10.5)
WBC: 7.7 10*3/uL (ref 4.0–10.5)

## 2017-01-07 LAB — GLUCOSE, CAPILLARY
GLUCOSE-CAPILLARY: 186 mg/dL — AB (ref 65–99)
GLUCOSE-CAPILLARY: 238 mg/dL — AB (ref 65–99)
GLUCOSE-CAPILLARY: 263 mg/dL — AB (ref 65–99)
Glucose-Capillary: 193 mg/dL — ABNORMAL HIGH (ref 65–99)
Glucose-Capillary: 204 mg/dL — ABNORMAL HIGH (ref 65–99)
Glucose-Capillary: 249 mg/dL — ABNORMAL HIGH (ref 65–99)

## 2017-01-07 LAB — BASIC METABOLIC PANEL
Anion gap: 8 (ref 5–15)
BUN: 66 mg/dL — AB (ref 6–20)
CHLORIDE: 107 mmol/L (ref 101–111)
CO2: 16 mmol/L — AB (ref 22–32)
CREATININE: 3.89 mg/dL — AB (ref 0.61–1.24)
Calcium: 7.3 mg/dL — ABNORMAL LOW (ref 8.9–10.3)
GFR calc Af Amer: 16 mL/min — ABNORMAL LOW (ref >60)
GFR calc non Af Amer: 14 mL/min — ABNORMAL LOW (ref >60)
GLUCOSE: 188 mg/dL — AB (ref 65–99)
POTASSIUM: 3.6 mmol/L (ref 3.5–5.1)
Sodium: 131 mmol/L — ABNORMAL LOW (ref 135–145)

## 2017-01-07 LAB — PREPARE RBC (CROSSMATCH)

## 2017-01-07 LAB — CULTURE, BLOOD (ROUTINE X 2)
Culture: NO GROWTH
Culture: NO GROWTH
Special Requests: ADEQUATE
Special Requests: ADEQUATE

## 2017-01-07 LAB — MAGNESIUM: MAGNESIUM: 1.2 mg/dL — AB (ref 1.7–2.4)

## 2017-01-07 LAB — PHOSPHORUS: Phosphorus: 2.9 mg/dL (ref 2.5–4.6)

## 2017-01-07 MED ORDER — SODIUM CHLORIDE 0.9 % IV SOLN
Freq: Once | INTRAVENOUS | Status: AC
  Administered 2017-01-07: 21:00:00 via INTRAVENOUS

## 2017-01-07 MED ORDER — INSULIN ASPART 100 UNIT/ML ~~LOC~~ SOLN
0.0000 [IU] | SUBCUTANEOUS | Status: DC
  Administered 2017-01-07: 5 [IU] via SUBCUTANEOUS
  Administered 2017-01-07: 8 [IU] via SUBCUTANEOUS
  Administered 2017-01-07 – 2017-01-08 (×3): 5 [IU] via SUBCUTANEOUS
  Administered 2017-01-08 (×2): 3 [IU] via SUBCUTANEOUS
  Administered 2017-01-08: 5 [IU] via SUBCUTANEOUS
  Administered 2017-01-08: 2 [IU] via SUBCUTANEOUS
  Administered 2017-01-09 (×2): 3 [IU] via SUBCUTANEOUS
  Administered 2017-01-09: 2 [IU] via SUBCUTANEOUS
  Administered 2017-01-09: 3 [IU] via SUBCUTANEOUS
  Administered 2017-01-09: 5 [IU] via SUBCUTANEOUS
  Administered 2017-01-10: 3 [IU] via SUBCUTANEOUS
  Administered 2017-01-10: 5 [IU] via SUBCUTANEOUS
  Administered 2017-01-10: 3 [IU] via SUBCUTANEOUS
  Administered 2017-01-10: 5 [IU] via SUBCUTANEOUS
  Administered 2017-01-10: 3 [IU] via SUBCUTANEOUS
  Administered 2017-01-10 – 2017-01-11 (×2): 2 [IU] via SUBCUTANEOUS
  Administered 2017-01-11 (×2): 3 [IU] via SUBCUTANEOUS
  Administered 2017-01-12: 2 [IU] via SUBCUTANEOUS
  Administered 2017-01-12: 5 [IU] via SUBCUTANEOUS
  Administered 2017-01-12: 2 [IU] via SUBCUTANEOUS
  Administered 2017-01-13: 5 [IU] via SUBCUTANEOUS
  Administered 2017-01-13 (×2): 3 [IU] via SUBCUTANEOUS
  Administered 2017-01-13 (×2): 2 [IU] via SUBCUTANEOUS
  Administered 2017-01-14: 8 [IU] via SUBCUTANEOUS
  Administered 2017-01-14: 2 [IU] via SUBCUTANEOUS
  Administered 2017-01-14: 3 [IU] via SUBCUTANEOUS
  Administered 2017-01-15: 8 [IU] via SUBCUTANEOUS
  Administered 2017-01-15 (×2): 2 [IU] via SUBCUTANEOUS
  Administered 2017-01-15: 3 [IU] via SUBCUTANEOUS
  Administered 2017-01-16 (×2): 5 [IU] via SUBCUTANEOUS

## 2017-01-07 MED ORDER — GUAIFENESIN-DM 100-10 MG/5ML PO SYRP
5.0000 mL | ORAL_SOLUTION | ORAL | Status: DC | PRN
  Administered 2017-01-07 – 2017-01-16 (×18): 5 mL via ORAL
  Filled 2017-01-07 (×19): qty 5

## 2017-01-07 MED ORDER — INSULIN GLARGINE 100 UNIT/ML ~~LOC~~ SOLN
15.0000 [IU] | Freq: Every day | SUBCUTANEOUS | Status: DC
  Administered 2017-01-08 – 2017-01-16 (×9): 15 [IU] via SUBCUTANEOUS
  Filled 2017-01-07 (×10): qty 0.15

## 2017-01-07 MED ORDER — MAGNESIUM SULFATE 2 GM/50ML IV SOLN
2.0000 g | Freq: Once | INTRAVENOUS | Status: AC
  Administered 2017-01-07: 2 g via INTRAVENOUS
  Filled 2017-01-07: qty 50

## 2017-01-07 NOTE — Progress Notes (Signed)
Chaplain Clifton Custardaron asked me to visit and have prayer with this family and patient.  Patient relaxed after bath and ready to rest.  Had brief visit and prayer for healing and for family to come together per request. Phebe Collaonna S Cassie Henkels, Chaplain   01/07/17 1100  Clinical Encounter Type  Visited With Patient and family together  Visit Type Initial;Spiritual support  Referral From Chaplain  Consult/Referral To Chaplain  Spiritual Encounters  Spiritual Needs Prayer  Stress Factors  Patient Stress Factors None identified  Family Stress Factors None identified

## 2017-01-07 NOTE — Progress Notes (Signed)
INTERNAL MEDICINE INTERVAL PROGRESS NOTE  Received from PCCM NP-C that patient is ready for return to IMTS for further management. In brief, this is a 71 y/o M with end-stage COPD, DM2, CKD-4, HTN, HLD and chronic anemia, admitted 01/02/17 with multifocal pneumonia, Enterobacter UTI and nonketotic hyperosmolar hyperglycemia, both of which were being treated and improving appropriately on Ceftriaxone and Azithromycin. During admission he had a syncopal episode while ambulating back to bed from rest room. Family noted his unresponsiveness and was found to be bradycardic to the 30's. Code blue was called and he received Atropine, Calcium and Bicarb with successful resuscitation. He was not intubated. He was subsequently transferred to the ICU for further care.   Post-code labs showed a significant non-anion gap metabolic acidosis and poor renal function. CBC showed an acute 3g hemoglobin drop from 8 to 5.1. He was transfused 2 U PRB and repeat H&H 7.1. Acute blood loss anemia was suspected and GI was consulted however acute on chronic anemia was felt to be multifactorial (dilutional effect from IVF, baseline CKD and phlebotomy). He was not felt to be a candidate for endoscopy and GI is to check back in next week. FOBT collected 01/06/17 positive. Further care is being transferred to IMTS 01/07/17 at 7am.

## 2017-01-07 NOTE — Progress Notes (Signed)
Transfer Summary: [name] was transferred to the ICU on 12/27 after being found unresponsive, bradycardic with HR 30s, and with agonal breathing. He received atropine, calcium, and bicarb. He was not intubated. In the ICU, he was found to be profoundly anemic with Hgb 5 (from 8) with no obvious source of bleeding.  He received 2 units of pRBCs on 12/26 with adequate response in Hgb, 8.4. CT A/P 12/27 was negative for intra-abdominal hemorrhage and showed mild small bowel and proximal colon distention (?ileus). FOBT positive 12/28. GI consulted and anemia thought to be multifactorial secondary to dilution from IVF, baseline CKD, and phlebotomy.  He was not felt to be a good candidate for endoscopy per GI given acute illness, but they will continue to follow patient and revisit need for EGD on 12/31. His Hgb today is 7.3.  Azithromycin 12/24 >12/25 Rocephin 12/24 >> 12/24  Unasyn 12/25 >> 12/25 Levaquin 12/26 >>  Subjective:  No acute events overnight. States his legs are bothering him and he feels weak. Otherwise no complaints this morning. Discussed plan to continue to monitor blood count, continuing antibiotics for infection, and PT/OT consult. Family at bedside. Patient voiced understanding and is in agreement with plan.   Objective:  Vital signs in last 24 hours: Vitals:   01/07/17 0735 01/07/17 0800 01/07/17 0900 01/07/17 1000  BP:  (!) 121/56  (!) 125/55  Pulse:  88 88 96  Resp:  (!) 27 (!) 28 (!) 31  Temp: 98.7 F (37.1 C)     TempSrc: Oral     SpO2:  96% 96% 98%  Weight:      Height:       General: pleasant male, well-developed, lying in bed sleeping in no acute distress  CV: regular rate and rhythm, nl S1/S2, no murmurs, rubs or gallops  Pulm: CTAB, no wheezes or crackles, no increased work of breathing on 2L O2 Abd: soft, NTND, normoactive bowel sounds Neuro: A&Ox3, overall weak but no focal deficits noted  Ext: warm and well perfused, no peripheral edema, 2+ DP  pulses bilaterally, wearing SCDs    Assessment/Plan:  Ross Bauer is a 71 y.o. male with history of COPD (no PFTs), CKD IV, chronic anemia, T2DM, HTN, HLD, and BPH who presented with altered mental status and found to have multifocal PNA, enterobacter UTI, and nonketotic hyperosmolar hyperglycemia.Transfer to ICU 12/27 after respiratory arrest. Now IMTS resuming care.   # Multifocal PNA: thought to be secondary to aspiration given history of dysphagia. Flu negative. No RVP ordered.  Currently satting well on 2L of oxygen. Blood cx negative at 5 days. Has received 5 days of antibiotics including azithromycin, CTX, Unasyn and now on levofloxacin.  - Wean to room air as tolerated - Continue levofloxacin 500 mg q48h, last dose today  - Will need outpatient PFTs  # Weakness: likely multifactorial in the setting of acute illness and poor PO intake for the past 2 weeks. Mg 1.2 this AM.  - PT/OT consult  - Monitoring electrolytes and repleting PRN  - Meal supplements  - BMP and Mg in AM   # Enterobacter UTI: Ucx with >100K colonies of Enterobacter on admission. Completed 5 days of antibiotics. Resolved.   # Acute on chronic anemia: thought to be secondary to GI blood loss given positive FOBT. Evaluated by GI on 12/28, but not a candidate for EGD given acute illness. GI plans to follow up Monday and determine if  EGD needed.  - CT  A/P negative for intra-abdominal blood loss - Iron 35, TIBC 157, ferritin 593 - Hgb 5.1, post-transfusion 8.4-> 7.2-> 7.3  - Continue PO protonix 40 mg QD  - CBC in PM - Transfusion goal Hgb 7 point  # Dysphagia: Swallow evaluation performed 12/25 and patient noted to cough and possibly aspirate during ingestion of liquids. He was started on dysphagia 1 diet. Per GI note 12/28, there is also concern of underlying esophageal mechanical issue vs malignancy.  - Continue Dysphagia 3 diet   # Acute on CKD IV: Baseline Cr ~3 and Cr 6 on presentation though to be  secondary dehydration in the setting of poor PO intake x1-2 weeks prior to presentation. Renal function now improving. Cr 3.8 this AM.  - Continue to monitor renal function  - Avoid nephrotoxic medications   # T2DM: A1c 11.3 12/24 from 6.4 3 months ago. Presented with nonketotic hyperosmolar hyperglycemia requiring insulin gtt. Now on SQ insulin. CBG 193 this AM.  Time is 1 - Increase Lantus 10--> 15U QHS   - M-SSI + CBG monitoring   # COPD (per imaging, no PFTs) # Tobacco abuse:  Currently on 2L [place]. Not on oxygen at home.  - Maintain O2 sats 88-92% - Wean to room air as tolerated  - Encourage smoking cessation   # HTN: On amlodipine 10, Coreg 25 BID and clonidine 0.1 BID. Currently normotensive.  - Continue to hold home antihypertensive medications   #: HLD:  - Continue home Lipitor 80 mg QD   Dispo: Anticipated discharge in approximately 2-4 day(s) pending anemia workup and improvement in clinical status.   [name], Babacar Haycraft, MD 01/07/2017, 11:59 AM Pager: [phone]

## 2017-01-07 NOTE — Progress Notes (Signed)
Medicine attending: I personally interviewed and examined this patient today and reviewed pertinent clinical laboratory and x-ray data.  I concur with the evaluation and management plan as recorded in the transfer summary note by resident physician Dr. Burna CashIdalys Santos-Sanchez.  71 year old man admitted on December 24 with a 2-week history of upper respiratory symptoms, progressive generalized weakness, cough, and confusion.  He was hyperglycemic.  X-ray showed multifocal bilateral pulmonary infiltrates consistent with pneumonia.  Treatment initiated with antibiotics.  Glucose control.  He developed an episode of symptomatic bradycardia and an acute fall in his hemoglobin to 5 g from admission hemoglobin of 8.6 in the context of stage V renal insufficiency.  He was transferred to the intensive care unit.  No gross signs of active bleeding.  He did have a positive fecal occult blood test.  He was transfused.  Cardiac and management plan stabilized.  Antibiotics continued.  Endoscopy deferred until medical condition more stable. Urine grew enterococcus covered by his antibiotic regimen. Condition stable enough to return to the general medical service.  Current exam: Blood pressure (!) 100/46, pulse 90, temperature 98 F (36.7 C), temperature source Oral, resp. rate (!) 27, height 5\' 6"  (1.676 m), weight 135 lb 9.3 oz (61.5 kg), SpO2 97 %. Elderly African-American man who is awake alert and cooperative Pharynx no erythema or exudate.  Lungs with minimal bibasilar rales.  Distant cardiac sounds.  Regular rhythm.  No obvious murmur.  No JVD.  Abdomen soft and nontender.  No mass or organomegaly.  Extremities no edema.  No calf tenderness.  Neurologic is grossly normal.  Problem list and plan accurate as recorded by Dr. Lovenia KimSantos-Sanchez in her transfer note. Given his degree of renal insufficiency I believe he would benefit from the use of a ESA.  Patient's son is present.  Status and management plan  reviewed.

## 2017-01-07 NOTE — Progress Notes (Signed)
CRITICAL VALUE ALERT  Critical Value:  hgb 6.4  Date & Time Notied:  01/07/17 7:43 PM  Provider Notified: Dr. Dan HumphreysWalker (IM residency)  Orders Received/Actions taken: MD to place orders

## 2017-01-08 DIAGNOSIS — D649 Anemia, unspecified: Secondary | ICD-10-CM

## 2017-01-08 LAB — BPAM RBC
BLOOD PRODUCT EXPIRATION DATE: 201901052359
Blood Product Expiration Date: 201812272359
Blood Product Expiration Date: 201901092359
Blood Product Expiration Date: 201901092359
Blood Product Expiration Date: 201901212359
ISSUE DATE / TIME: 201812261833
ISSUE DATE / TIME: 201812262146
ISSUE DATE / TIME: 201812292036
ISSUE DATE / TIME: 201812261833
ISSUE DATE / TIME: 201812281329
UNIT TYPE AND RH: 7300
Unit Type and Rh: 7300
Unit Type and Rh: 7300
Unit Type and Rh: 9500
Unit Type and Rh: 9500

## 2017-01-08 LAB — TYPE AND SCREEN
ABO/RH(D): B POS
ANTIBODY SCREEN: NEGATIVE
UNIT DIVISION: 0
UNIT DIVISION: 0
Unit division: 0
Unit division: 0
Unit division: 0

## 2017-01-08 LAB — CBC
HCT: 26.4 % — ABNORMAL LOW (ref 39.0–52.0)
Hemoglobin: 8.6 g/dL — ABNORMAL LOW (ref 13.0–17.0)
MCH: 28.7 pg (ref 26.0–34.0)
MCHC: 32.6 g/dL (ref 30.0–36.0)
MCV: 88 fL (ref 78.0–100.0)
PLATELETS: 240 10*3/uL (ref 150–400)
RBC: 3 MIL/uL — ABNORMAL LOW (ref 4.22–5.81)
RDW: 15.2 % (ref 11.5–15.5)
WBC: 8.7 10*3/uL (ref 4.0–10.5)

## 2017-01-08 LAB — BASIC METABOLIC PANEL WITH GFR
Anion gap: 7 (ref 5–15)
BUN: 53 mg/dL — ABNORMAL HIGH (ref 6–20)
CO2: 17 mmol/L — ABNORMAL LOW (ref 22–32)
Calcium: 7.4 mg/dL — ABNORMAL LOW (ref 8.9–10.3)
Chloride: 107 mmol/L (ref 101–111)
Creatinine, Ser: 3.52 mg/dL — ABNORMAL HIGH (ref 0.61–1.24)
GFR calc Af Amer: 18 mL/min — ABNORMAL LOW
GFR calc non Af Amer: 16 mL/min — ABNORMAL LOW
Glucose, Bld: 169 mg/dL — ABNORMAL HIGH (ref 65–99)
Potassium: 3.5 mmol/L (ref 3.5–5.1)
Sodium: 131 mmol/L — ABNORMAL LOW (ref 135–145)

## 2017-01-08 LAB — GLUCOSE, CAPILLARY
GLUCOSE-CAPILLARY: 157 mg/dL — AB (ref 65–99)
GLUCOSE-CAPILLARY: 201 mg/dL — AB (ref 65–99)
GLUCOSE-CAPILLARY: 186 mg/dL — AB (ref 65–99)
GLUCOSE-CAPILLARY: 150 mg/dL — AB (ref 65–99)
GLUCOSE-CAPILLARY: 247 mg/dL — AB (ref 65–99)

## 2017-01-08 LAB — MAGNESIUM: MAGNESIUM: 1.5 mg/dL — AB (ref 1.7–2.4)

## 2017-01-08 MED ORDER — CLONIDINE HCL 0.1 MG PO TABS: 0.1000 mg | ORAL_TABLET | Freq: Two times a day (BID) | ORAL | Status: DC | PRN

## 2017-01-08 MED ORDER — AMLODIPINE BESYLATE 5 MG PO TABS
5.0000 mg | ORAL_TABLET | Freq: Every day | ORAL | Status: DC
  Administered 2017-01-08 – 2017-01-16 (×8): 5 mg via ORAL
  Filled 2017-01-08 (×8): qty 1

## 2017-01-08 MED ORDER — DARBEPOETIN ALFA 200 MCG/0.4ML IJ SOSY
200.0000 ug | PREFILLED_SYRINGE | INTRAMUSCULAR | Status: DC
  Administered 2017-01-08 – 2017-01-15 (×2): 200 ug via SUBCUTANEOUS
  Filled 2017-01-08 (×2): qty 0.4

## 2017-01-08 MED ORDER — MAGNESIUM SULFATE 2 GM/50ML IV SOLN: 2.0000 g | Freq: Once | INTRAVENOUS | Status: DC

## 2017-01-08 MED ORDER — FERROUS SULFATE 325 (65 FE) MG PO TABS
325.0000 mg | ORAL_TABLET | Freq: Every day | ORAL | Status: DC
  Administered 2017-01-09: 325 mg via ORAL
  Filled 2017-01-08: qty 1

## 2017-01-08 NOTE — Progress Notes (Signed)
Patient arrived to 6N18 A&Ox4, VSS, IV intact.  Patient denies pain at this time.  Son at bedside.  Patient and son oriented to room and equipment.  Will continue to monitor.

## 2017-01-08 NOTE — Progress Notes (Signed)
Medicine attending: I examined this patient today together with resident physician Dr. Lovenia KimSantos-Sanchez and I concur with her evaluation and management plan which we discussed together. He remains afebrile off antibiotics.  Additional transfusion given for fall in hemoglobin to 6.4. General exam stable.  Lungs clear.  No cardiac murmur.  Abdomen soft and nontender. Impression: Initial multi focal pneumonia.  Complicated by respiratory arrest, acute fall in hemoglobin, and progressive renal dysfunction now stage V, requiring temporary transfer to intensive care unit.  Condition stabilized with parenteral antibiotics and transfusion. Now in recovery phase.  Transfer to general medical ward today.  Involve physical therapy.  Ongoing attention to renal function and electrolytes.  Begin ESA. Patient's son present.  Status and plan reviewed.

## 2017-01-08 NOTE — Consult Note (Signed)
[facility] Consult Note     Date: 01/08/2017                  Patient Name:  Ross Bauer  MRN: [id number]  DOB: 04-Nov-1945  Age / Sex: 71 y.o., male         PCP: Ross Linsey, MD                 Service Requesting Consult: IMTS, Dr. Beryle Bauer                 Reason for Consult: Anemia management in CKD and management of CKD            Chief Complaint: dyspnea  HPI: Pt is a [name] with a PMH significant for CKD IV, HTN, HLD, DM II, BPH who is now seen in consultation at the request of Dr. Beryle Bauer for eval and recs re: anemia of CKD and management of CKD.  Briefly, pt was admitted 12/24 to [facility] with 2 weeks of viral URI symtpoms and was found to have multifocal pneumonia.  Also had HHS felt to be secondary to infection.  Initially had an AKI with Cr peaking at 6, is now 3.5.  Baseline creatinine appears to be 3.1, last known 05/2016 according to [facility] records (follows with Dr. Florene Bauer).  He was found to have an enterobacter UTI and was treated.  Pt was initially treated with Levaquin for CAP and was doing well until he suffered a syncopal event after going to the bathroom 12/26.  He was bradycardic as well, got atropine, bicarb, ca and was transferred to ICU.  Hgb was initially in the 8s and was found to be 5.1 after syncopal event.  FOBT positive.  He has been evaluated by GI and there is possibly a plan to scope next week. He has been transfused.    He reports SOB.  He has severe COPD by imaging.   Past Medical History:  Diagnosis Date  . Anemia in chronic kidney disease 06/19/2009  . Background diabetic retinopathy associated with type 2 diabetes mellitus ([place]) 11/13/2013   Bilateral. Followed by ophthalmologist Dr. Clent Bauer.  [name] BPH (benign prostatic hyperplasia) 09/30/2016  . Chronic back pain 05/09/2007   "all over" (01/02/2017)  . Erectile dysfunction associated with type 2 diabetes mellitus ([place]) 2001  . Essential hypertension   . Hyperlipidemia   .  Onychomycosis of right great toe 06/01/2015  . Pneumonia 12/2016   CAP LLL 12/2016  . 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 Florene Bauer.  . Tobacco abuse 03/20/2007  . Type 2 diabetes mellitus with stage 4 chronic kidney disease ([place])     Past Surgical History:  Procedure Laterality Date  . NO PAST SURGERIES      Family History  Problem Relation Age of Onset  . Cancer Mother        died in 26s.  . Cancer Father        died in 61s  . Healthy Sister   . Healthy Brother   . Unexplained death Brother   . Healthy Daughter   . Healthy Daughter    Social History:  reports that he has been smoking cigarettes.  He has a 6.00 pack-year smoking history. he has never used smokeless tobacco. He reports that he does not drink alcohol or use drugs.  Allergies:  Allergies  Allergen Reactions  . Ace Inhibitors     REACTION:  intolerance    Medications Prior to Admission  Medication Sig Dispense Refill  . amLODipine (NORVASC) 10 MG tablet Take 1 tablet (10 mg total) daily by mouth. 90 tablet 3  . aspirin 81 MG tablet Take 1 tablet (81 mg total) by mouth daily. 90 tablet 3  . atorvastatin (LIPITOR) 80 MG tablet Take 1 tablet (80 mg total) by mouth daily at 6 PM. 90 tablet 3  . carvedilol (COREG) 25 MG tablet Take 1 tablet (25 mg total) by mouth 2 (two) times daily with a meal. 180 tablet 3  . cloNIDine (CATAPRES) 0.1 MG tablet Take 1 tablet (0.1 mg total) by mouth every 12 (twelve) hours as needed. (Patient taking differently: Take 0.1 mg by mouth every 12 (twelve) hours as needed (hypertension). ) 90 tablet 0  . furosemide (LASIX) 40 MG tablet Take 1 tablet (40 mg total) by mouth daily. 90 tablet 1  . glipiZIDE (GLUCOTROL) 10 MG tablet Take 1 tablet (10 mg total) by mouth 2 (two) times daily before a meal. 180 tablet 3  . terazosin (HYTRIN) 2 MG capsule Take 1 capsule (2 mg total) by mouth at bedtime. (Patient not taking:  Reported on 01/02/2017) 90 capsule 3    Results for orders placed or performed during the hospital encounter of 01/02/17 (from the past 48 hour(s))  Glucose, capillary     Status: Abnormal   Collection Time: 01/06/17 11:48 AM  Result Value Ref Range   Glucose-Capillary 240 (H) 65 - 99 mg/dL   Comment 1 Capillary Specimen    Comment 2 Notify RN   MRSA PCR Screening     Status: None   Collection Time: 01/06/17  1:26 PM  Result Value Ref Range   MRSA by PCR NEGATIVE NEGATIVE    Comment:        The GeneXpert MRSA Assay (FDA approved for NASAL specimens only), is one component of a comprehensive MRSA colonization surveillance program. It is not intended to diagnose MRSA infection nor to guide or monitor treatment for MRSA infections.   Glucose, capillary     Status: Abnormal   Collection Time: 01/06/17  3:48 PM  Result Value Ref Range   Glucose-Capillary 243 (H) 65 - 99 mg/dL   Comment 1 Capillary Specimen    Comment 2 Notify RN   Occult blood card to lab, stool     Status: Abnormal   Collection Time: 01/06/17  5:51 PM  Result Value Ref Range   Fecal Occult Bld POSITIVE (A) NEGATIVE  Glucose, capillary     Status: Abnormal   Collection Time: 01/06/17  7:33 PM  Result Value Ref Range   Glucose-Capillary 211 (H) 65 - 99 mg/dL   Comment 1 Capillary Specimen    Comment 2 Notify RN   Glucose, capillary     Status: Abnormal   Collection Time: 01/06/17 10:48 PM  Result Value Ref Range   Glucose-Capillary 197 (H) 65 - 99 mg/dL   Comment 1 Capillary Specimen    Comment 2 Notify RN   Basic metabolic panel     Status: Abnormal   Collection Time: 01/07/17  2:26 AM  Result Value Ref Range   Sodium 131 (L) 135 - 145 mmol/L   Potassium 3.6 3.5 - 5.1 mmol/L   Chloride 107 101 - 111 mmol/L   CO2 16 (L) 22 - 32 mmol/L   Glucose, Bld 188 (H) 65 - 99 mg/dL   BUN 66 (H) 6 - 20 mg/dL   Creatinine, Ser 3.89 (  H) 0.61 - 1.24 mg/dL   Calcium 7.3 (L) 8.9 - 10.3 mg/dL   GFR calc non Af Amer  14 (L) >60 mL/min   GFR calc Af Amer 16 (L) >60 mL/min    Comment: (NOTE) The eGFR has been calculated using the CKD EPI equation. This calculation has not been validated in all clinical situations. eGFR's persistently <60 mL/min signify possible Chronic Kidney Disease.    Anion gap 8 5 - 15  Magnesium     Status: Abnormal   Collection Time: 01/07/17  2:26 AM  Result Value Ref Range   Magnesium 1.2 (L) 1.7 - 2.4 mg/dL  CBC     Status: Abnormal   Collection Time: 01/07/17  2:26 AM  Result Value Ref Range   WBC 8.9 4.0 - 10.5 K/uL   RBC 2.51 (L) 4.22 - 5.81 MIL/uL   Hemoglobin 7.3 (L) 13.0 - 17.0 g/dL   HCT 22.3 (L) 39.0 - 52.0 %   MCV 88.8 78.0 - 100.0 fL   MCH 29.1 26.0 - 34.0 pg   MCHC 32.7 30.0 - 36.0 g/dL   RDW 16.0 (H) 11.5 - 15.5 %   Platelets 223 150 - 400 K/uL  Phosphorus     Status: None   Collection Time: 01/07/17  2:26 AM  Result Value Ref Range   Phosphorus 2.9 2.5 - 4.6 mg/dL  Glucose, capillary     Status: Abnormal   Collection Time: 01/07/17  2:59 AM  Result Value Ref Range   Glucose-Capillary 186 (H) 65 - 99 mg/dL   Comment 1 Capillary Specimen    Comment 2 Notify RN   Glucose, capillary     Status: Abnormal   Collection Time: 01/07/17  7:37 AM  Result Value Ref Range   Glucose-Capillary 193 (H) 65 - 99 mg/dL   Comment 1 Capillary Specimen    Comment 2 Notify RN   Glucose, capillary     Status: Abnormal   Collection Time: 01/07/17 12:02 PM  Result Value Ref Range   Glucose-Capillary 204 (H) 65 - 99 mg/dL   Comment 1 Capillary Specimen    Comment 2 Notify RN   Glucose, capillary     Status: Abnormal   Collection Time: 01/07/17  4:23 PM  Result Value Ref Range   Glucose-Capillary 249 (H) 65 - 99 mg/dL   Comment 1 Capillary Specimen    Comment 2 Notify RN   CBC     Status: Abnormal   Collection Time: 01/07/17  6:59 PM  Result Value Ref Range   WBC 7.7 4.0 - 10.5 K/uL   RBC 2.19 (L) 4.22 - 5.81 MIL/uL   Hemoglobin 6.4 (LL) 13.0 - 17.0 g/dL     Comment: REPEATED TO VERIFY CRITICAL RESULT CALLED TO, READ BACK BY AND VERIFIED WITH: RN R PROCTOR AT [id number] MARTINB    HCT 19.2 (L) 39.0 - 52.0 %   MCV 87.7 78.0 - 100.0 fL   MCH 29.2 26.0 - 34.0 pg   MCHC 33.3 30.0 - 36.0 g/dL   RDW 15.6 (H) 11.5 - 15.5 %   Platelets 210 150 - 400 K/uL  Glucose, capillary     Status: Abnormal   Collection Time: 01/07/17  7:35 PM  Result Value Ref Range   Glucose-Capillary 238 (H) 65 - 99 mg/dL   Comment 1 Capillary Specimen    Comment 2 Notify RN   Prepare RBC     Status: None   Collection Time: 01/07/17  8:00 PM  Result  Value Ref Range   Order Confirmation ORDER PROCESSED BY BLOOD BANK   Glucose, capillary     Status: Abnormal   Collection Time: 01/07/17 11:23 PM  Result Value Ref Range   Glucose-Capillary 263 (H) 65 - 99 mg/dL   Comment 1 Notify RN   Glucose, capillary     Status: Abnormal   Collection Time: 01/08/17  3:48 AM  Result Value Ref Range   Glucose-Capillary 157 (H) 65 - 99 mg/dL   Comment 1 Capillary Specimen    Comment 2 Notify RN   CBC     Status: Abnormal   Collection Time: 01/08/17  6:37 AM  Result Value Ref Range   WBC 8.7 4.0 - 10.5 K/uL   RBC 3.00 (L) 4.22 - 5.81 MIL/uL   Hemoglobin 8.6 (L) 13.0 - 17.0 g/dL    Comment: DELTA CHECK NOTED REPEATED TO VERIFY    HCT 26.4 (L) 39.0 - 52.0 %   MCV 88.0 78.0 - 100.0 fL   MCH 28.7 26.0 - 34.0 pg   MCHC 32.6 30.0 - 36.0 g/dL   RDW 15.2 11.5 - 15.5 %   Platelets 240 150 - 400 K/uL  Basic metabolic panel     Status: Abnormal   Collection Time: 01/08/17  6:37 AM  Result Value Ref Range   Sodium 131 (L) 135 - 145 mmol/L   Potassium 3.5 3.5 - 5.1 mmol/L   Chloride 107 101 - 111 mmol/L   CO2 17 (L) 22 - 32 mmol/L   Glucose, Bld 169 (H) 65 - 99 mg/dL   BUN 53 (H) 6 - 20 mg/dL   Creatinine, Ser 3.52 (H) 0.61 - 1.24 mg/dL   Calcium 7.4 (L) 8.9 - 10.3 mg/dL   GFR calc non Af Amer 16 (L) >60 mL/min   GFR calc Af Amer 18 (L) >60 mL/min    Comment: (NOTE) The eGFR has  been calculated using the CKD EPI equation. This calculation has not been validated in all clinical situations. eGFR's persistently <60 mL/min signify possible Chronic Kidney Disease.    Anion gap 7 5 - 15  Magnesium     Status: Abnormal   Collection Time: 01/08/17  6:37 AM  Result Value Ref Range   Magnesium 1.5 (L) 1.7 - 2.4 mg/dL  Glucose, capillary     Status: Abnormal   Collection Time: 01/08/17  7:45 AM  Result Value Ref Range   Glucose-Capillary 201 (H) 65 - 99 mg/dL   Comment 1 Capillary Specimen    Comment 2 Notify RN    Dg Chest Port 1 View  Result Date: 01/07/2017 CLINICAL DATA:  Acute respiratory failure with hypoxia. EXAM: PORTABLE CHEST 1 VIEW COMPARISON:  01/02/2017 FINDINGS: Heart size is within normal limits. Mixed interstitial and airspace disease is seen in both lungs, with focal consolidation in the right lower lung. These findings show no significant change. This is superimposed on pulmonary emphysema. No evidence of pneumothorax. IMPRESSION: No significant change in diffuse interstitial and airspace disease, with focal consolidation in the right lower lung. Electronically Signed   By: Earle Gell M.D.   On: 01/07/2017 10:21    ROS: all other systems reviewed and are negative  Blood pressure (!) 174/67, pulse 95, temperature 99.1 F (37.3 C), temperature source Oral, resp. rate (!) 29, height '5\' 6"'$  (1.676 m), weight 61.5 kg (135 lb 9.3 oz), SpO2 92 %. Physical Exam  GEN thin, NAD, lying flat in bed HEENT EOMI, PERRL NECK flat neck veins PULM  poor air movement bilaterally, some expiratory rhonchi, no wheezes CV RRR soft systolic murmur ABD thin EXT trace LE edema NEURO AAO x 3   Assessment/Plan  1.  AKI on CKD IV: appears to be resolving.  Baseline cr 3.1.  Likely hypoperfusion injury from sepsis.  Making good urine.  Doesn't need dialysis.  Was supposed to have seen Dr. Florene Bauer tomorrow.  2.  Acute on chronic anemia: likely CKD + ABLA.  Iron studies with  ferritin of 593, Tsat 22% (pre-transfusion).  Will start Aranesp 200 mcg now, PO iron, and follow.  Gi has seen, will follow up tomorrow to determine necessity of scopes.  3.  BMMD: will add on PTH  4.  CAP: s/p antibiotics.  5.  HTN: pressures a little high here, would start amlodipine 5 and follow, avoid all nodal blocking agents for now given bradycardia previously in hospitalization.  6.  Enterobacter UTI: has been treated  7.  Syncope/bradycardia: seems likely due to acute blood loss anemia, CT abd without intra-abdominal blood loss  8.  Dispo: pending   Madelon Lips, MD [facility] pgr [phone] 01/08/2017, 11:45 AM

## 2017-01-08 NOTE — Evaluation (Signed)
Physical Therapy Evaluation Patient Details Name: Ross Bauer MRN: [id number] DOB: 05/13/1945 Today's Date: 01/08/2017   History of Present Illness  71 year old man with h/o HTN, anemia, hyperlipidemia, and DM admitted on December 24 with a 2-week history of upper respiratory symptoms, progressive generalized weakness, cough, and confusion.  He was hyperglycemic.  X-ray showed multifocal bilateral pulmonary infiltrates consistent with pneumonia.  He developed an episode of symptomatic bradycardia and an acute fall in his hemoglobin to 5 g from admission hemoglobin of 8.6 in the context of stage V renal insufficiency.  Clinical Impression  Patient presents with decreased independence with mobility due to weakness, poor activity tolerance and deconditioning.  Currently needs +2 A for ambulation for safety in the room.  Previously was independent and able to traverse steps for home entry.  Feel he will need SNF level rehab at d/c as family works.  PT to follow acutely as well.     Follow Up Recommendations SNF    Equipment Recommendations  Other (comment)(TBA)    Recommendations for Other Services       Precautions / Restrictions Precautions Precautions: Fall Precaution Comments: fell at home about 1-2 weeks PTA per daughter with vertigo episode      Mobility  Bed Mobility Overal bed mobility: Needs Assistance Bed Mobility: Supine to Sit     Supine to sit: Mod assist;HOB elevated;Max assist     General bed mobility comments: increased time, assist for legs off EOB and to lift trunk upright  Transfers Overall transfer level: Needs assistance Equipment used: Rolling walker (2 wheeled) Transfers: Sit to/from Stand Sit to Stand: Mod assist;+2 safety/equipment         General transfer comment: Patient pulling up on walker from bed, cues and assist for pushing up from armrests on recliner twice due to incontinent episodes in bed, then in chair.  Placed pillows on seat for  improved comfort, then placed feet on stool for safety.  Ambulation/Gait Ambulation/Gait assistance: +2 safety/equipment;Max assist Ambulation Distance (Feet): 12 Feet Assistive device: Rolling walker (2 wheeled) Gait Pattern/deviations: Shuffle;Decreased stride length;Step-to pattern;Trunk flexed;Wide base of support     General Gait Details: max cues for feet up in walker, for balance and for managing lines with chair following, HR up to 121 and pt fatigued  Stairs            Wheelchair Mobility    Modified Rankin (Stroke Patients Only)       Balance Overall balance assessment: Needs assistance Sitting-balance support: Feet supported Sitting balance-Leahy Scale: Poor Sitting balance - Comments: grabbing on bed at footboard in sitting   Standing balance support: Bilateral upper extremity supported Standing balance-Leahy Scale: Poor Standing balance comment: UE support and mod A for balance                             Pertinent Vitals/Pain Pain Assessment: 0-10 Pain Score: 7  Pain Location: sore on bottom  Pain Descriptors / Indicators: Sore Pain Intervention(s): Monitored during session;Repositioned    Home Living Family/patient expects to be discharged to:: Skilled nursing facility Living Arrangements: Other relatives Available Help at Discharge: Family Type of Home: Apartment Home Access: Stairs to enter Entrance Stairs-Rails: Left Entrance Stairs-Number of Steps: 15 Home Layout: One level   Additional Comments: grandson lives with pt, but works    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               Hand  Dominance        Extremity/Trunk Assessment   Upper Extremity Assessment Upper Extremity Assessment: RUE deficits/detail;LUE deficits/detail RUE Deficits / Details: AROM WFL, strength grossly 4/5 except grip weaker LUE Deficits / Details: AROM WFL, strength grossly 4/5 except grip weaker    Lower Extremity  Assessment Lower Extremity Assessment: RLE deficits/detail;LLE deficits/detail RLE Deficits / Details: AROM WFL, strength hip flexion 3+/5, knee extension 4/5, ankle DF 4/5 LLE Deficits / Details: AROM WFL, strength hip flexion 3+/5, knee extension 4/5, ankle DF 4/5    Cervical / Trunk Assessment Cervical / Trunk Assessment: Kyphotic  Communication   Communication: HOH  Cognition Arousal/Alertness: Awake/alert Behavior During Therapy: Flat affect Overall Cognitive Status: Impaired/Different from baseline Area of Impairment: Problem solving                             Problem Solving: Slow processing General Comments: somewhat slow to respond (also HOH)      General Comments General comments (skin integrity, edema, etc.): Pt incontinent of stool in bed, stood for hygiene, then rested, then walkerd, then sat in chair and then stood to place pillows under hips due to pain on bottom and pt was incontinent of stool again so cleaned and replaced pads and pillows in chair prior to sitting    Exercises     Assessment/Plan    PT Assessment Patient needs continued PT services  PT Problem List Decreased strength;Decreased knowledge of use of DME;Decreased activity tolerance;Decreased balance;Decreased knowledge of precautions;Decreased mobility;Decreased coordination       PT Treatment Interventions DME instruction;Balance training;Gait training;Stair training;Functional mobility training;Patient/family education;Therapeutic activities;Therapeutic exercise    PT Goals (Current goals can be found in the Care Plan section)  Acute Rehab PT Goals Patient Stated Goal: To go home PT Goal Formulation: With patient/family Time For Goal Achievement: 01/22/17 Potential to Achieve Goals: Good    Frequency Min 3X/week   Barriers to discharge        Co-evaluation               AM-PAC PT "6 Clicks" Daily Activity  Outcome Measure Difficulty turning over in bed (including  adjusting bedclothes, sheets and blankets)?: Unable Difficulty moving from lying on back to sitting on the side of the bed? : Unable Difficulty sitting down on and standing up from a chair with arms (e.g., wheelchair, bedside commode, etc,.)?: Unable Help needed moving to and from a bed to chair (including a wheelchair)?: A Lot Help needed walking in hospital room?: A Lot Help needed climbing 3-5 steps with a railing? : Total 6 Click Score: 8    End of Session Equipment Utilized During Treatment: Gait belt Activity Tolerance: Patient limited by fatigue Patient left: in chair;with call bell/phone within reach;with family/visitor present   PT Visit Diagnosis: Other abnormalities of gait and mobility (R26.89);Muscle weakness (generalized) (M62.81)    Time: [id number] PT Time Calculation (min) (ACUTE ONLY): 35 min   Charges:   PT Evaluation $PT Eval Moderate Complexity: 1 Mod PT Treatments $Gait Training: 8-22 mins   PT G CodesSheran Lawless:        Cyndi Bauer, [place]PT [phone] 01/08/2017   Ross Bauer 01/08/2017, 10:48 AM

## 2017-01-08 NOTE — Progress Notes (Signed)
Subjective:  Hgb 6.4 overnight now s/p 1U pRBCs. When seen on rounds he was notably short of breath while sitting in bed with RR 22-30s and satting 88-92%. HR 100-105.  Patient will continue to monitor blood counts.  Discussed need to involve nephrology in his care given worsening chronic kidney disease.  Also discussed starting EPO.  He voiced understanding and is in agreement with plan.   Objective:  Vital signs in last 24 hours: Vitals:   01/08/17 0700 01/08/17 0743 01/08/17 0800 01/08/17 0900  BP:   134/64 (!) 137/104  Pulse: 97  95 97  Resp: (!) 24  (!) 27 (!) 29  Temp:  (!) 97.4 F (36.3 C)    TempSrc:  Oral    SpO2: 91%  90% 90%  Weight:      Height:       General: Elderly male who appears chronically ill, thin, lying in bed short of breath and able to speak in full sentences Cardiac: regular rate and rhythm, nl S1/S2, no murmurs, rubs or gallops  Pulm: Rhonchorous breath sounds improved from yesterday, increased work of breathing on room air, unable to speak in full sentences while resting Abd: soft, NTND, bowel sounds present Neuro: A&Ox3, weak and deconditioned   Ext: warm and well perfused, no peripheral edema   Assessment/Plan:  Ross Bauer is a 71 y.o. male with history of COPD (no PFTs), CKD IV, chronic anemia, T2DM, HTN, HLD, and BPH who presented with altered mental status and found to have multifocal PNA, enterobacter UTI, and nonketotic hyperosmolar hyperglycemia.Transfer to ICU 12/27 after respiratory arrest.   # Acute on chronic anemia: Hgb 6.4 overnight and now s/p 1u pRBCs. Post transfusion Hgb 8.6.  Currently hemodynamically stable.  Thought to be secondary to GI blood loss given positive FOBT vs worsening CKD. Evaluated by GI on 12/28, but not a candidate for EGD given acute illness. GI plans to follow up tomorrow and determine if  EGD needed. Will also consult nephrology. Could benefit from EPO.  - CT A/P negative for intra-abdominal blood loss - Iron  35, TIBC 157, ferritin 593 - Hgb 6.4--> 8.6 post-transfusion  - Continue PO protonix 40 mg QD  - Transfusion goal Hgb 7 point - Nephrology consulted, appreciate recommendations   # Multifocal PNA: thought to be secondary to aspiration given history of dysphagia. Increased work of breathing when seen this AM while on room air. Bcx negative at 5 days. Completed 7 day course of antibiotics.  - Will need outpatient PFTs - Will continue to monitor respiratory status   # Weakness: likely multifactorial in the setting of acute illness and poor PO intake for the past 2 weeks. Mg 1.5 this AM.  - PT recommended SNF   - Monitoring electrolytes and repleting PRN  - Meal supplements   # Enterobacter UTI: Resolved. Completed antibiotic therapy.   # Dysphagia: Swallow evaluation performed 12/25 and patient noted to cough and possibly aspirate during ingestion of liquids. He was started on dysphagia 1 diet. Per GI note 12/28, there is also concern of underlying esophageal mechanical issue vs malignancy.  - Continue Dysphagia 3 diet   # Acute on CKD IV: Baseline Cr ~3 and Cr 6 on presentation though to be secondary dehydration in the setting of poor PO intake x1-2 weeks prior to presentation. Renal function continues to improve. Cr 3.5 this AM.  - Continue to monitor renal function  - Nephrology consulted, appreciate recommendations  - Avoid nephrotoxic medications   #  T2DM: A1c 11.3 12/24 from 6.4 3 months ago. Presented with nonketotic hyperosmolar hyperglycemia requiring insulin gtt. Now on SQ insulin. CBG 193 this AM.  Time is 1 - Lantus 15U QHS   - M-SSI + CBG monitoring   # COPD (per imaging, no PFTs) # Tobacco abuse:  On room air this morning with notable increased work of breathing.  Oxygenation 88-92%. Placed back on nasal cannula. - Maintain O2 sats 88-92% - Wean to room air as tolerated  - Encourage smoking cessation   # HTN: On amlodipine 10, Coreg 25 BID and clonidine 0.1 BID.  Currently normotensive.  - Continue to hold home antihypertensive medications   #: HLD:  - Continue home Lipitor 80 mg QD     Dispo: Anticipated discharge in approximately 3-4 day(s).   [name], Ross Bacigalupi, MD 01/08/2017, 10:55 AM Pager: [phone]

## 2017-01-09 DIAGNOSIS — R1013 Epigastric pain: Secondary | ICD-10-CM

## 2017-01-09 DIAGNOSIS — D500 Iron deficiency anemia secondary to blood loss (chronic): Secondary | ICD-10-CM

## 2017-01-09 LAB — MAGNESIUM: MAGNESIUM: 1.3 mg/dL — AB (ref 1.7–2.4)

## 2017-01-09 LAB — CBC
HEMATOCRIT: 27 % — AB (ref 39.0–52.0)
HEMOGLOBIN: 8.7 g/dL — AB (ref 13.0–17.0)
MCH: 28.9 pg (ref 26.0–34.0)
MCHC: 32.2 g/dL (ref 30.0–36.0)
MCV: 89.7 fL (ref 78.0–100.0)
Platelets: 272 10*3/uL (ref 150–400)
RBC: 3.01 MIL/uL — AB (ref 4.22–5.81)
RDW: 15.7 % — ABNORMAL HIGH (ref 11.5–15.5)
WBC: 9 10*3/uL (ref 4.0–10.5)

## 2017-01-09 LAB — BASIC METABOLIC PANEL
Anion gap: 7 (ref 5–15)
BUN: 52 mg/dL — AB (ref 6–20)
CHLORIDE: 105 mmol/L (ref 101–111)
CO2: 17 mmol/L — AB (ref 22–32)
Calcium: 7.4 mg/dL — ABNORMAL LOW (ref 8.9–10.3)
Creatinine, Ser: 3.53 mg/dL — ABNORMAL HIGH (ref 0.61–1.24)
GFR calc Af Amer: 18 mL/min — ABNORMAL LOW (ref >60)
GFR calc non Af Amer: 16 mL/min — ABNORMAL LOW (ref >60)
GLUCOSE: 157 mg/dL — AB (ref 65–99)
POTASSIUM: 3.5 mmol/L (ref 3.5–5.1)
SODIUM: 129 mmol/L — AB (ref 135–145)

## 2017-01-09 LAB — GLUCOSE, CAPILLARY
GLUCOSE-CAPILLARY: 102 mg/dL — AB (ref 65–99)
GLUCOSE-CAPILLARY: 147 mg/dL — AB (ref 65–99)
Glucose-Capillary: 153 mg/dL — ABNORMAL HIGH (ref 65–99)
Glucose-Capillary: 189 mg/dL — ABNORMAL HIGH (ref 65–99)
Glucose-Capillary: 193 mg/dL — ABNORMAL HIGH (ref 65–99)
Glucose-Capillary: 201 mg/dL — ABNORMAL HIGH (ref 65–99)

## 2017-01-09 MED ORDER — FERROUS SULFATE 325 (65 FE) MG PO TABS
325.0000 mg | ORAL_TABLET | Freq: Every day | ORAL | Status: DC
  Administered 2017-01-12 – 2017-01-16 (×5): 325 mg via ORAL
  Filled 2017-01-09 (×5): qty 1

## 2017-01-09 MED ORDER — SODIUM BICARBONATE 650 MG PO TABS
650.0000 mg | ORAL_TABLET | Freq: Three times a day (TID) | ORAL | Status: DC
  Administered 2017-01-09 – 2017-01-16 (×21): 650 mg via ORAL
  Filled 2017-01-09 (×21): qty 1

## 2017-01-09 NOTE — Progress Notes (Signed)
Competed AD froms and placed one on file with unit secretary and original and 2 copies with the patient. Phebe CollaDonna S Jakylan Ron, Chaplain   01/09/17 1200  Clinical Encounter Type  Visited With Patient and family together  Visit Type Follow-up;Other (Comment)  Referral From Chaplain  Consult/Referral To Chaplain  Recommendations completed AD forms  Spiritual Encounters  Spiritual Needs Other (Comment) (Completed AD forms)  Advance Directives (For Healthcare)  Does Patient Have a Medical Advance Directive? Yes  Type of Estate agentAdvance Directive Healthcare Power of [place];Living will  Copy of Healthcare Power of Attorney in Chart? Yes  Copy of Living Will in Chart? Yes

## 2017-01-09 NOTE — Progress Notes (Signed)
Subjective:  No acute events overnight.  Patient bowel movement this morning.  Covered him with the blanket and refused to talk to me, but did state he was doing well and no complaints this morning.  Discuss plan for GI evaluation and changes of medications per nephrology.  Family not present at bedside when seen.  Patient voiced understanding and is in agreement with plan.  Objective:  Vital signs in last 24 hours: Vitals:   01/08/17 0900 01/08/17 1132 01/08/17 2012 01/09/17 0500  BP: (!) 137/104 (!) 174/67 (!) 137/46 (!) 154/60  Pulse: 97 95 (!) 104 100  Resp: (!) 29   16  Temp:  99.1 F (37.3 C) 100.1 F (37.8 C) 100.2 F (37.9 C)  TempSrc:  Oral Oral Oral  SpO2: 90% 92% 94% 94%  Weight:      Height:       General: Thin and chronically ill male, covered himself with blanket and did not want to talk this morning but in no acute distress Cardiac: regular rate and rhythm, nl S1/S2, no murmurs, rubs or gallops  Pulm: CTAB, no wheezes or crackles, no increased work of breathing  Abd: soft, NTND, bowel sounds present  Neuro: A&Ox3, able to move all 4 extremities but limited by weakness otherwise no focal deficits noted Ext: warm and well perfused, no peripheral edema, 2+ DP pulses bilaterally    Assessment/Plan:  Ross Bauer is a 71 y.o. male with history ofCOPD (no PFTs),CKD IV,chronic anemia,T2DM, HTN, HLD, and BPH who presented with altered mental status and found to have multifocal PNA,enterobacter UTI, and nonketotic hyperosmolar hyperglycemia.Transfer to ICU 12/27 after respiratory arrest.   # Acute on chronic anemia: Hgb stable in mid 8s. Currently hemodynamically stable.  Thought to be secondary to GI blood loss given positive FOBT vs worsening CKD. Evaluated by GI on 12/28, but not a candidate for EGD given acute illness. Now possible EGD on 1/2.  Nephrology now following and started Aranesp yesterday. - CT A/P negative for intra-abdominal blood loss - Iron 35,  TIBC 157, ferritin 593 - Continue PO protonix 40 mg QD  - Transfusion goal Hgb 7point - Nephrology consulted, appreciate recommendations: Aranesp started 12/30, bicarb started today, will need outpatient follow-up once discharge   #Multifocal PNA:  Completed 7 day course of antibiotics.  - Will need outpatient PFTs - Will continue to monitor respiratory status   # Weakness: likely multifactorial in the setting of acute illness and poor PO intake for the past 2 weeks. Mg 1.5 this AM.  - PT recommended SNF   - Monitoring electrolytes and repleting PRN  - Meal supplements   # Dysphagia: Swallow evaluation performed 12/25 and patient noted to cough and possibly aspirate during ingestion of liquids. He was started on dysphagia 1 diet. Per GI note 12/28, there is also concern of underlying esophageal mechanical issue vs malignancy.  - Continue Dysphagia 3 diet   # Acute on CKD IV: Baseline Cr ~3 and Cr 6 on presentation though to be secondary dehydration in the setting of poor PO intake x1-2 weeks prior to presentation. Renal function continues to improve. Cr stable at 3.5. Had appt with Dr. Lowell GuitarPowell on 12/30. Will need outpatient follow up once discharged.  - Continue to monitor renal function  - Nephrology consulted, appreciate recommendations - Aranesp 200 mcg started 12/30 - Bicarb today  - Avoid nephrotoxic medications   # T2DM: A1c 11.3 12/24 from 6.4 3 months ago. Presented with nonketotic hyperosmolar hyperglycemia requiring  insulin gtt. Now on SQ insulin. CBG 193 this AM.Time is 1 - Lantus 15U QHS  - M-SSI + CBG monitoring   # COPD (per imaging, no PFTs) # Tobacco abuse:  Appears comfortable this morning and able to speak in full sentences on room air. - Maintain O2 sats 88-92% - Encourage smoking cessation   # HTN: On amlodipine 10, Coreg 25 BID and clonidine 0.1 BID.  Mildly hypertensive. - Resume home amlodipine 5 mg daily per nephrology - Avoid beta  blockers  #: HLD:  - Continue home Lipitor 80 mg QD   Dispo: Anticipated discharge in approximately 2-4 day(s) pending GI evaluation.   [name], Ross Therrell, MD 01/09/2017, 7:53 AM Pager: [phone]

## 2017-01-09 NOTE — Progress Notes (Signed)
  Speech Language Pathology Treatment: Dysphagia  Patient Details Name: Ross ManifoldJulius L Annas MRN: [id number] DOB: 10/14/1945 Today's Date: 01/09/2017 Time: [id number] SLP Time Calculation (min) (ACUTE ONLY): 13 min  Assessment / Plan / Recommendation Clinical Impression  Pt required Min cues for smaller bites and slower pacing, with mastication moderately prolonged with regular textures. He was mildly short of breath at baseline with no overt changes in respirations across intake. He coughed x1 prior to PO intake, with no further coughing observed while SLP was present. Recommend to continue current diet and precautions. SLP will f/u briefly for continued tolerance.   HPI HPI: 71 yo [facility] type 2 DM with hypersomolar nonketotic hyperglycemia and found to have pna.  PMH + for DM, confusion, poor po intake, weakness, rapid response called with pt with RR 30s to 40s, 90s oxygen saturation on 5 liters oxygen.  Per family member in room, pt with poor intake prior to admit and episode of vertigo a few weeks ago.  Per daughter, pt refused to go to MD with vertigo episode.  He also had dysphagia prior to admit with symptoms including sensation of residuals in pharynx, oral holding and bringing food/drink up after intake.  Today pt is fully alert with clear voice. [name].        SLP Plan  Continue with current plan of care       Recommendations  Diet recommendations: Dysphagia 3 (mechanical soft);Thin liquid Liquids provided via: Cup;Straw Medication Administration: Whole meds with puree Supervision: Patient able to self feed;Full supervision/cueing for compensatory strategies Compensations: Slow rate;Small sips/bites Postural Changes and/or Swallow Maneuvers: Seated upright 90 degrees                Oral Care Recommendations: Oral care BID Follow up Recommendations: Skilled Nursing facility SLP Visit Diagnosis: Dysphagia, oral phase (R13.11) Plan: Continue with current plan of  care       GO                Ross Bauer, Ross Bauer 01/09/2017, 2:50 PM  Ross HamLaura Bauer, M.A. CCC-SLP [phone]

## 2017-01-09 NOTE — Progress Notes (Signed)
Medicine attending: Clinical status and database reviewed with resident physician Dr. Lovenia KimSantos-Sanchez and I concur with her evaluation and management plan which we discussed together. Low-grade temperature.  Not currently on antibiotics.  Hemoglobin stable after most recent transfusion given yesterday morning.  We appreciate ongoing GI follow-up.  EGD tentatively scheduled for January 3. We also appreciate nephrology input.  Renal function has stabilized back to his baseline and dialysis not a consideration at this time.  He was been started on ESA's (Aranesp) yesterday which will be continued intermittently to support his hemoglobin.

## 2017-01-09 NOTE — Progress Notes (Signed)
Daily Rounding Note  01/09/2017, 8:37 AM  LOS: 7 days   SUBJECTIVE:   Anorexia persists.  Denies vomiting, regurge, nausea, dysphagia.  On D3 diet.  BM yesterday.  .   Breathing better.  Has barely walked in last few days.    OBJECTIVE:         Vital signs in last 24 hours:    Temp:  [99.1 F (37.3 C)-100.2 F (37.9 C)] 100.2 F (37.9 C) (12/31 0500) Pulse Rate:  [95-104] 100 (12/31 0500) Resp:  [16-29] 16 (12/31 0500) BP: (137-174)/(46-104) 154/60 (12/31 0500) SpO2:  [90 %-94 %] 94 % (12/31 0500) Last BM Date: 01/08/17 Filed Weights   01/03/17 2019 01/04/17 0435  Weight: 61.5 kg (135 lb 9.3 oz) 61.5 kg (135 lb 9.3 oz)   General: frail, chronically ill, cachectic   Heart: RRR Chest: diminished BS on left.  No cough.  No dyspnea at rest Abdomen: soft, thin, NT.  Active BS  Extremities: slight non-pitting pedal edema L > R.   Neuro/Psych:  Alert, appropriate, laconic.  Affect flat but did get irritated with his dtr at which time was more animated  Intake/Output from previous day: 12/30 0701 - 12/31 0700 In: 90 [P.O.:90] Out: 1600 [Urine:1600]  Intake/Output this shift: No intake/output data recorded.  Lab Results: Recent Labs    01/07/17 1859 01/08/17 0637 01/09/17 0358  WBC 7.7 8.7 9.0  HGB 6.4* 8.6* 8.7*  HCT 19.2* 26.4* 27.0*  PLT 210 240 272   BMET Recent Labs    01/07/17 0226 01/08/17 0637 01/09/17 0358  NA 131* 131* 129*  K 3.6 3.5 3.5  CL 107 107 105  CO2 16* 17* 17*  GLUCOSE 188* 169* 157*  BUN 66* 53* 52*  CREATININE 3.89* 3.52* 3.53*  CALCIUM 7.3* 7.4* 7.4*   LFT No results for input(s): PROT, ALBUMIN, AST, ALT, ALKPHOS, BILITOT, BILIDIR, IBILI in the last 72 hours. PT/INR No results for input(s): LABPROT, INR in the last 72 hours. Hepatitis Panel No results for input(s): HEPBSAG, HCVAB, HEPAIGM, HEPBIGM in the last 72 hours.  Studies/Results: No results found.   Scheduled  Meds: . amLODipine  5 mg Oral Daily  . atorvastatin  80 mg Oral q1800  . darbepoetin (ARANESP) injection - NON-DIALYSIS  200 mcg Subcutaneous Q Sun-1800  . ferrous sulfate  325 mg Oral Q breakfast  . insulin aspart  0-15 Units Subcutaneous Q4H  . insulin glargine  15 Units Subcutaneous Daily  . mouth rinse  15 mL Mouth Rinse BID  . pantoprazole  40 mg Oral Daily  . protein supplement shake  11 oz Oral BID BM  . sodium bicarbonate  650 mg Oral TID   Continuous Infusions: . magnesium sulfate 1 - 4 g bolus IVPB     PRN Meds:.acetaminophen **OR** acetaminophen, docusate sodium, guaiFENesin-dextromethorphan, ondansetron **OR** ondansetron (ZOFRAN) IV   ASSESMENT:   *  Acute on chronic anemia.  Suspect multifactorial:  Dilutional effect of IVF, baseline CKD, phlebotomy.   s/pn 3 U PRBC 12/27 - 12/29.  Aranesp initiated.  1 x daily po iron starts today per renal Dr Signe ColtUpton.      *  Dysphagia.  MBS mentions unremarkable scan of esophagus during MBS.  Wonder if source of PP vomiting is gastric?   *  multifocal CAP and Enterobacter UTI. Previous Rx: Zithromax, Unasyn, rocephin, IV Levaquin >> oral levaquin.  Has completed all abx  *  HHS, insulin drip completed.     *  AKI, CKD stage 4.  *  deconditioning     PLAN   *   EGD on 01/11/17?  Discussed possibility of this with pt and his dtr.  Pt reluctantly agreeable.   If EGD happens, will stop po iron until after EGD as it stains mucosal lining and can be confused for blood at endo.  At this point seems to high risk for colonoscopy and ? Could he even complete bowel prep? Will make him NPO post midnight 01/11/17 in case he looks good for EGD that day.  Will reassess that AM and make final decision re EGD.      Jennye MoccasinSarah Takumi Din  01/09/2017, 8:37 AM Pager: [phone]

## 2017-01-09 NOTE — Progress Notes (Signed)
[facility] Rounding Note  Background: 51M with a PMH significant for CKD IV, HTN, HLD, DM II, BPH. Admitted 12/24 w/URI symtpoms, found to have multifocal PNA.  Had AKI with Cr peak at 6, improved to 3.5. rx'd Levaquin for CAP, had syncopal event after going to the bathroom 12/26 w/bradycardia (atropine/bicarb/Ca), Hb drop to 5.1 w/GIB. Seen in consultation  By us 12/30 at request of Dr. Cyndie ChimeGranfortuna for eval and recs re: anemia of CKD and management of CKD.  Baseline creatinine appears to be 3.1, last known 05/2016 according to [facility] records (follows with Dr. Lowell GuitarPowell).   Assessment/Recommendations  1. AKI on CKD IV:  Baseline cr 3.1 05/2016.  AKI w/peak creatinine 6, now at plateau 3.5 past 24 hours. Likely hypoperfusion injury from sepsis/bradycardic syncope.  Making good urine. No dialysis indications.  (Was supposed to have seen Dr. Lowell GuitarPowell today as outpt).  2. Acute on chronic anemia: CKD + ABLA.  Iron studies with ferritin of 593, Tsat 22% (pre-transfusion).  Rec'd Aranesp 200 12/30. Will not be due another dose for a week and we will arrange for this to be given in outpt setting at time of discharge,  3. Metabolic acidosis - mild and persistent despite correction of blood sugars. Start po sodium bicarb. Goal CO2 for CKD  20-22. 4. GIB - GI has seen, to see in F/U determine necessity of scopes. Transfused total of 3 units this admission (nadir Hb 5.1)  with Hb stable mid 8's following 3rd unit. 5. DM2 - per primary service. HHS on admission. Back on Lantus.  6. BMMD: PTH pending. Last phos fine at 2.9. Recheck now that eating 7. CAP: s/p antibiotics. 8. HTN: Amlodipine 5. Avoid nodal blocking agents for now given bradycardia previously in hospitalization. 9. Enterobacter UTI: has been treated 10. Syncope/bradycardia: 2/2 acute blood loss anemia, CT abd without intra-abdominal blood loss 11. Dispo: pending   Subjective/Interval History:  Low grade temp this AM to 100.2 BP labile/sinus tach with first  degree AVB on tele  Asked me to tell Dr. Lowell GuitarPowell he was going to miss his appt today!!  Objective Vital signs in last 24 hours: Vitals:   01/08/17 0900 01/08/17 1132 01/08/17 2012 01/09/17 0500  BP: (!) 137/104 (!) 174/67 (!) 137/46 (!) 154/60  Pulse: 97 95 (!) 104 100  Resp: (!) 29   16  Temp:  99.1 F (37.3 C) 100.1 F (37.8 C) 100.2 F (37.9 C)  TempSrc:  Oral Oral Oral  SpO2: 90% 92% 94% 94%  Weight:      Height:       Weight change:   Intake/Output Summary (Last 24 hours) at 01/09/2017 0805 Last data filed at 01/09/2017 0700 Gross per 24 hour  Intake 60 ml  Output 1600 ml  Net -1540 ml   Physical Exam:  Blood pressure (!) 154/60, pulse 100, temperature 100.2 F (37.9 C), temperature source Oral, resp. rate 16, height 5\' 6"  (1.676 m), weight 61.5 kg (135 lb 9.3 oz), SpO2 94 %.  Very nice older gentleman Thin and frail, lights off, no breakfast yet NAD UOP as noted above Low grade temp Bilateral posterior crackles No wheezes Mildly tachy around 100 S1S2 No S3 Soft abdomen Trace if any LE edema Alert, oriented (just a little cranky because I woke him up)   Recent Labs  Lab 01/03/17 0249 01/04/17 0224 01/05/17 0249 01/06/17 0322 01/07/17 0226 01/08/17 0637 01/09/17 0358  NA 142  142 138 136 131* 131* 131* 129*  K 4.9  5.0 4.0 4.1 3.8 3.6 3.5 3.5  CL 118*  117* 115* 111 107 107 107 105  CO2 15*  16* 16* 18* 16* 16* 17* 17*  GLUCOSE 354*  353* 175* 209* 227* 188* 169* 157*  BUN 96*  96* 82* 74* 74* 66* 53* 52*  CREATININE 4.54*  4.52* 4.08* 3.58* 3.81* 3.89* 3.52* 3.53*  CALCIUM 8.1*  8.1* 7.5* 7.4* 7.1* 7.3* 7.4* 7.4*  PHOS  --   --   --   --  2.9  --   --     Recent Labs  Lab 01/07/17 0226 01/07/17 1859 01/08/17 0637 01/09/17 0358  WBC 8.9 7.7 8.7 9.0  HGB 7.3* 6.4* 8.6* 8.7*  HCT 22.3* 19.2* 26.4* 27.0*  MCV 88.8 87.7 88.0 89.7  PLT 223 210 240 272    Recent Labs  Lab 01/08/17 1145 01/08/17 1617 01/08/17 1956 01/09/17 0046  01/09/17 0459  GLUCAP 186* 150* 247* 193* 153*     Iron/TIBC/Ferritin/ %Sat    Component Value Date/Time   IRON 35 (L) 01/02/2017 1200   TIBC 157 (L) 01/02/2017 1200   FERRITIN 593 (H) 01/02/2017 1200   IRONPCTSAT 22 01/02/2017 1200   Studies/Results: Dg Chest Port 1 View  Result Date: 01/07/2017 CLINICAL DATA:  Acute respiratory failure with hypoxia. EXAM: PORTABLE CHEST 1 VIEW COMPARISON:  01/02/2017 FINDINGS: Heart size is within normal limits. Mixed interstitial and airspace disease is seen in both lungs, with focal consolidation in the right lower lung. These findings show no significant change. This is superimposed on pulmonary emphysema. No evidence of pneumothorax. IMPRESSION: No significant change in diffuse interstitial and airspace disease, with focal consolidation in the right lower lung. Electronically Signed   By: Myles RosenthalJohn  Stahl M.D.   On: 01/07/2017 10:21   Medications: . magnesium sulfate 1 - 4 g bolus IVPB     . amLODipine  5 mg Oral Daily  . atorvastatin  80 mg Oral q1800  . darbepoetin (ARANESP) injection - NON-DIALYSIS  200 mcg Subcutaneous Q Sun-1800  . ferrous sulfate  325 mg Oral Q breakfast  . insulin aspart  0-15 Units Subcutaneous Q4H  . insulin glargine  15 Units Subcutaneous Daily  . mouth rinse  15 mL Mouth Rinse BID  . pantoprazole  40 mg Oral Daily  . protein supplement shake  11 oz Oral BID BM    Camille Balynthia Mckale Haffey, MD [facility] [phone] pager 01/09/2017, 8:05 AM

## 2017-01-10 LAB — RENAL FUNCTION PANEL
ANION GAP: 7 (ref 5–15)
Albumin: 1.2 g/dL — ABNORMAL LOW (ref 3.5–5.0)
BUN: 47 mg/dL — ABNORMAL HIGH (ref 6–20)
CALCIUM: 7.3 mg/dL — AB (ref 8.9–10.3)
CO2: 19 mmol/L — AB (ref 22–32)
CREATININE: 3.6 mg/dL — AB (ref 0.61–1.24)
Chloride: 106 mmol/L (ref 101–111)
GFR, EST AFRICAN AMERICAN: 18 mL/min — AB (ref >60)
GFR, EST NON AFRICAN AMERICAN: 15 mL/min — AB (ref >60)
Glucose, Bld: 217 mg/dL — ABNORMAL HIGH (ref 65–99)
PHOSPHORUS: 3.4 mg/dL (ref 2.5–4.6)
Potassium: 3.6 mmol/L (ref 3.5–5.1)
SODIUM: 132 mmol/L — AB (ref 135–145)

## 2017-01-10 LAB — PARATHYROID HORMONE, INTACT (NO CA): PTH: 52 pg/mL (ref 15–65)

## 2017-01-10 LAB — CBC
HCT: 23.9 % — ABNORMAL LOW (ref 39.0–52.0)
HEMOGLOBIN: 7.9 g/dL — AB (ref 13.0–17.0)
MCH: 29.4 pg (ref 26.0–34.0)
MCHC: 33.1 g/dL (ref 30.0–36.0)
MCV: 88.8 fL (ref 78.0–100.0)
Platelets: 261 10*3/uL (ref 150–400)
RBC: 2.69 MIL/uL — AB (ref 4.22–5.81)
RDW: 15.8 % — ABNORMAL HIGH (ref 11.5–15.5)
WBC: 7.8 10*3/uL (ref 4.0–10.5)

## 2017-01-10 LAB — GLUCOSE, CAPILLARY
GLUCOSE-CAPILLARY: 147 mg/dL — AB (ref 65–99)
GLUCOSE-CAPILLARY: 200 mg/dL — AB (ref 65–99)
GLUCOSE-CAPILLARY: 207 mg/dL — AB (ref 65–99)
GLUCOSE-CAPILLARY: 229 mg/dL — AB (ref 65–99)
Glucose-Capillary: 153 mg/dL — ABNORMAL HIGH (ref 65–99)
Glucose-Capillary: 169 mg/dL — ABNORMAL HIGH (ref 65–99)

## 2017-01-10 LAB — MAGNESIUM: Magnesium: 1.3 mg/dL — ABNORMAL LOW (ref 1.7–2.4)

## 2017-01-10 NOTE — Progress Notes (Signed)
Subjective:  No acute events overnight. Patient in better spirits this morning. Stated he was tired and wanted to sleep. Doing well otherwise. No other complaints this AM. Discussed plan for possible EGD tomorrow for evaluation of worsening anemia. Patient voiced understanding and is in agreement with plan.   Objective:  Vital signs in last 24 hours: Vitals:   01/09/17 0500 01/09/17 1503 01/09/17 2039 01/10/17 0414  BP: (!) 154/60 (!) 115/46 (!) 142/52 (!) 156/60  Pulse: 100 95 91 91  Resp: 16 18 18 19   Temp: 100.2 F (37.9 C) 98.1 F (36.7 C) 98.4 F (36.9 C) 98.9 F (37.2 C)  TempSrc: Oral Oral Oral   SpO2: 94% 93% 98% 95%  Weight:      Height:       General: pleasant male, thin, well-developed, lying in bed in no acute distress  Cardiac: regular rate and rhythm, nl S1/S2, no murmurs, rubs or gallops  Pulm: CTAB, no wheezes or crackles, no increased work of breathing while on room air   Abd: soft, NTND, bowel sounds present Neuro: A&Ox3, able to move all 4 extremities spontaneously   Ext: warm and well perfused, no peripheral edema   Assessment/Plan:  Ross Bauer is a 72 y.o. male with history ofCOPD (no PFTs),CKD IV,chronic anemia,T2DM, HTN, HLD, and BPH who presented with altered mental status and found to have multifocal PNA,enterobacter UTI, and nonketotic hyperosmolar hyperglycemia.Transfer to ICU 12/27 after respiratory arrest.   # Acute on chronic anemia:Hgb stable, 7.9 this AM. Currently HDS. Thought to be secondary to GI blood loss given positive FOBTvs worsening CKD. Evaluated by GI on 12/28, but not a candidate for EGD given acute illness. Now possible EGD on 1/2.  Nephrology following and started Aranesp 12/30. - Nephrology consulted, appreciate recommendations: Aranesp started 12/30, will need outpatient follow-up once discharge  - Possible EGD tomorrow  - CT A/P negative for intra-abdominal blood loss - Iron 35, TIBC 157, ferritin 593 - Continue  PO protonix 40 mg QD  - Transfusion goal Hgb 7point  #Multifocal PNA: Resolved   # Weakness: likely multifactorial in the setting of acute illness and poor PO intake for the past 2 weeks. Mg 1.5this AM.  - PTrecommended SNF - Monitoring electrolytes and repleting PRN  - Meal supplements   # Dysphagia: Swallow evaluation performed 12/25 and patient noted to cough and possibly aspirate during ingestion of liquids. He was started on dysphagia 1 diet. Per GI note 12/28, there is also concern of underlying esophageal mechanical issue vs malignancy.  - Continue Dysphagia 3 diet    # Acute on CKD IV: Baseline Cr ~3 and Cr 6 on presentation though to be secondary dehydration in the setting of poor PO intake x1-2 weeks prior to presentation. Renal functioncontinues to improve. Cr stable at 3.6. Will need outpatient follow up once discharged.  - Continue to monitor renal function - Nephrology consulted, appreciate recommendations - Aranesp 200 mcg started 12/30 - Bicarb started 12/31  - Avoid nephrotoxic medications   # T2DM: A1c 11.3 12/24 from 6.4 3 months ago. Presented with nonketotic hyperosmolar hyperglycemia requiring insulin gtt. Now on SQ insulin. CBG 193 this AM.Time is 1 - Lantus 15U QHS  - M-SSI + CBG monitoring   # COPD (per imaging, no PFTs) # Tobacco abuse: Appears comfortable this morning and able to speak in full sentences on room air. - Maintain O2 sats 88-92% - Encourage smoking cessation  - Will need outpatient PFTs  # HTN: On  amlodipine 10, Coreg 25 BID and clonidine 0.1 BID.  Mildly hypertensive. - Continue home amlodipine 5 mg daily per nephrology - Avoid beta blockers  #: HLD:  - Continue home Lipitor 80 mg QD   Dispo: Anticipated discharge in approximately 2-3 day(s) pending anemia workup and SNF placement.   Burna Cash, MD 01/10/2017, 10:07 AM Pager: [phone]

## 2017-01-10 NOTE — Progress Notes (Signed)
Medicine attending: I examined this patient today together with resident physician Dr. Lovenia KimSantos-Sanchez and I concur with her evaluation and management plan which we discussed together. The patient is much more interactive today.  Hemoglobin drifting down again after most recent transfusion.  He has a recently initiated and will take time to show an effect.  Still under consideration for endoscopy in view of initial occult blood positive stool.  Patient agreeable to proceed with the study which will likely be done tomorrow. Impression: 1.  Initial treatment for multifocal pneumonia with associated sepsis complicated by near respiratory arrest.  Condition stabilized with antibiotics and supportive care. 2.  Acute on chronic kidney injury from stage IV to stage V.  Slow improvement back to stage IV.  Nephrology now following and will continue to follow after discharge. 3.  Acute on chronic anemia Most of this related to bone marrow suppression from sepsis and renal failure.  No gross GI blood loss but positive occult blood in the stool.  But endoscopy planned. 4.  Deconditioning Discharge plans in progress

## 2017-01-10 NOTE — Social Work (Signed)
CSW has [facility] clinicals [facility]. CSW awaiting authorization.   CSW continuing to follow to support discharge when medically appropriate.   Doy HutchingIsabel H Branch Pacitti, LCSWA [facility] Clinical Social Work [phone]

## 2017-01-10 NOTE — Progress Notes (Signed)
[facility] Rounding Note  Background: 70M with a PMH significant for CKD IV, HTN, HLD, DM II, BPH. Admitted 12/24 w/URI symtpoms, found to have multifocal PNA.  Had AKI with Cr peak at 6, improved to 3.5. rx'd Levaquin for CAP, had syncopal event after going to the bathroom 12/26 w/bradycardia (atropine/bicarb/Ca), Hb drop to 5.1 w/GIB. Seen in consultation  By us 12/30 at request of Dr. Cyndie ChimeGranfortuna for eval and recs re: anemia of CKD and management of CKD.  Baseline creatinine appears to be 3.1, last known 05/2016 according to [facility] records (follows with Dr. Lowell GuitarPowell).   Assessment/Recommendations  1. AKI on CKD IV:  Baseline cr 3.1 05/2016.  AKI w/peak creatinine 6, now at plateau 3.5-3.6  past 48 hours. Likely hypoperfusion injury from sepsis/bradycardic syncope.  Making good urine. No dialysis indications. Will reschedule appt with Dr. Lowell GuitarPowell at discharge 2. Acute on chronic anemia: CKD + ABLA.  Iron studies with ferritin of 593, Tsat 22% (pre-transfusion).  Rec'd Aranesp 200 12/30. Will not be due another dose for a week and we will arrange for this to be given in outpt setting at time of discharge,  3. Metabolic acidosis - mild and persistent despite correction of blood sugars. Started po sodium bicarb. Goal CO2 for CKD  20-22. 4. GIB - GI has seen, for EGD on Wednesday. Transfused total of 3 units this admission (nadir Hb 5.1) down to 7.9 this AM and primary/GI following for additional transfusion need.  5. DM2 - per primary service. HHS on admission. Back on Lantus.  6. 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 No intervention needed. Phos looks fine 7. CAP: s/p antibiotics. 8. HTN: Amlodipine 5. Avoid nodal blocking agents for now given bradycardia previously in hospitalization. 9. Enterobacter UTI: has been treated 10. Syncope/bradycardia: 2/2 acute blood loss anemia, CT abd without intra-abdominal blood loss 11. Dispo: pending   Renal function stable, renal related issues including anemia and acidosis management as above. We  will arrange for pt to continue Aranesp as outpt at short stay center, and reschedule outpt f/u with Dr. Lowell GuitarPowell. Renal will sign off at this time but please call if other issues come up that we can assist with.  Camille Balynthia Aidah Forquer, MD [facility] [phone] Pager 01/10/2017, 10:56 AM   Subjective/Interval History:   For EGD tomorrow Seems in better spirits Hb trending down again  Objective Vital signs in last 24 hours: Vitals:   01/09/17 0500 01/09/17 1503 01/09/17 2039 01/10/17 0414  BP: (!) 154/60 (!) 115/46 (!) 142/52 (!) 156/60  Pulse: 100 95 91 91  Resp: 16 18 18 19   Temp: 100.2 F (37.9 C) 98.1 F (36.7 C) 98.4 F (36.9 C) 98.9 F (37.2 C)  TempSrc: Oral Oral Oral   SpO2: 94% 93% 98% 95%  Weight:      Height:       Weight change:   Intake/Output Summary (Last 24 hours) at 01/10/2017 1050 Last data filed at 01/10/2017 0846 Gross per 24 hour  Intake 1050 ml  Output 1725 ml  Net -675 ml   Physical Exam:  Blood pressure (!) 156/60, pulse 91, temperature 98.9 F (37.2 C), resp. rate 19, height 5\' 6"  (1.676 m), weight 61.5 kg (135 lb 9.3 oz), SpO2 95 %.  Very nice older gentleman Thin and frail NAD UOP as noted above Few posterior crackles No wheezes S1S2 No S3 Soft abdomen Trace if any LE edema Alert, oriented    Recent Labs  Lab 01/04/17 0224 01/05/17 0249 01/06/17 0322 01/07/17 0226  01/08/17 0637 01/09/17 0358 01/10/17 0357  NA 138 136 131* 131* 131* 129* 132*  K 4.0 4.1 3.8 3.6 3.5 3.5 3.6  CL 115* 111 107 107 107 105 106  CO2 16* 18* 16* 16* 17* 17* 19*  GLUCOSE 175* 209* 227* 188* 169* 157* 217*  BUN 82* 74* 74* 66* 53* 52* 47*  CREATININE 4.08* 3.58* 3.81* 3.89* 3.52* 3.53* 3.60*  CALCIUM 7.5* 7.4* 7.1* 7.3* 7.4* 7.4* 7.3*  PHOS  --   --   --  2.9  --   --  3.4    Recent Labs  Lab 01/07/17 1859 01/08/17 0637 01/09/17 0358 01/10/17 0357  WBC 7.7 8.7 9.0 7.8  HGB 6.4* 8.6* 8.7* 7.9*  HCT 19.2* 26.4* 27.0* 23.9*  MCV 87.7 88.0  89.7 88.8  PLT 210 240 272 261    Recent Labs  Lab 01/09/17 1634 01/09/17 2003 01/10/17 0005 01/10/17 0408 01/10/17 0743  GLUCAP 147* 189* 200* 229* 147*     Iron/TIBC/Ferritin/ %Sat    Component Value Date/Time   IRON 35 (L) 01/02/2017 1200   TIBC 157 (L) 01/02/2017 1200   FERRITIN 593 (H) 01/02/2017 1200   IRONPCTSAT 22 01/02/2017 1200   Medications: . magnesium sulfate 1 - 4 g bolus IVPB     . amLODipine  5 mg Oral Daily  . atorvastatin  80 mg Oral q1800  . darbepoetin (ARANESP) injection - NON-DIALYSIS  200 mcg Subcutaneous Q Sun-1800  . [START ON 01/12/2017] ferrous sulfate  325 mg Oral Q breakfast  . insulin aspart  0-15 Units Subcutaneous Q4H  . insulin glargine  15 Units Subcutaneous Daily  . mouth rinse  15 mL Mouth Rinse BID  . pantoprazole  40 mg Oral Daily  . protein supplement shake  11 oz Oral BID BM  . sodium bicarbonate  650 mg Oral TID    Camille Bal, MD [facility] [phone] pager 01/10/2017, 10:50 AM

## 2017-01-11 ENCOUNTER — Encounter (HOSPITAL_COMMUNITY)
Admission: EM | Disposition: A | Discharge status: Skilled Nursing Facility | Payer: Self-pay | Source: Home / Self Care | Attending: Oncology

## 2017-01-11 ENCOUNTER — Inpatient Hospital Stay (HOSPITAL_COMMUNITY): Payer: Medicare HMO | Admitting: Certified Registered Nurse Anesthetist

## 2017-01-11 ENCOUNTER — Encounter (HOSPITAL_COMMUNITY): Payer: Self-pay | Admitting: *Deleted

## 2017-01-11 DIAGNOSIS — K31819 Angiodysplasia of stomach and duodenum without bleeding: Secondary | ICD-10-CM

## 2017-01-11 HISTORY — PX: ESOPHAGOGASTRODUODENOSCOPY: SHX5428

## 2017-01-11 LAB — CBC
HCT: 25.8 % — ABNORMAL LOW (ref 39.0–52.0)
Hemoglobin: 8.3 g/dL — ABNORMAL LOW (ref 13.0–17.0)
MCH: 28.9 pg (ref 26.0–34.0)
MCHC: 32.2 g/dL (ref 30.0–36.0)
MCV: 89.9 fL (ref 78.0–100.0)
PLATELETS: 289 10*3/uL (ref 150–400)
RBC: 2.87 MIL/uL — ABNORMAL LOW (ref 4.22–5.81)
RDW: 16.1 % — AB (ref 11.5–15.5)
WBC: 8 10*3/uL (ref 4.0–10.5)

## 2017-01-11 LAB — BASIC METABOLIC PANEL
ANION GAP: 6 (ref 5–15)
BUN: 35 mg/dL — ABNORMAL HIGH (ref 6–20)
CALCIUM: 7.4 mg/dL — AB (ref 8.9–10.3)
CO2: 19 mmol/L — ABNORMAL LOW (ref 22–32)
CREATININE: 3.24 mg/dL — AB (ref 0.61–1.24)
Chloride: 107 mmol/L (ref 101–111)
GFR, EST AFRICAN AMERICAN: 20 mL/min — AB (ref >60)
GFR, EST NON AFRICAN AMERICAN: 18 mL/min — AB (ref >60)
Glucose, Bld: 128 mg/dL — ABNORMAL HIGH (ref 65–99)
Potassium: 3.3 mmol/L — ABNORMAL LOW (ref 3.5–5.1)
Sodium: 132 mmol/L — ABNORMAL LOW (ref 135–145)

## 2017-01-11 LAB — GLUCOSE, CAPILLARY
GLUCOSE-CAPILLARY: 187 mg/dL — AB (ref 65–99)
GLUCOSE-CAPILLARY: 118 mg/dL — AB (ref 65–99)
GLUCOSE-CAPILLARY: 127 mg/dL — AB (ref 65–99)
GLUCOSE-CAPILLARY: 188 mg/dL — AB (ref 65–99)
Glucose-Capillary: 103 mg/dL — ABNORMAL HIGH (ref 65–99)
Glucose-Capillary: 104 mg/dL — ABNORMAL HIGH (ref 65–99)
Glucose-Capillary: 110 mg/dL — ABNORMAL HIGH (ref 65–99)

## 2017-01-11 SURGERY — EGD (ESOPHAGOGASTRODUODENOSCOPY)
Anesthesia: Monitor Anesthesia Care

## 2017-01-11 MED ORDER — EPINEPHRINE PF 1 MG/10ML IJ SOSY
PREFILLED_SYRINGE | INTRAMUSCULAR | Status: AC
  Filled 2017-01-11: qty 10

## 2017-01-11 MED ORDER — SODIUM CHLORIDE 0.9 % IJ SOLN
PREFILLED_SYRINGE | INTRAMUSCULAR | Status: DC | PRN
  Administered 2017-01-11: 1.5 mL

## 2017-01-11 MED ORDER — PRO-STAT SUGAR FREE PO LIQD
30.0000 mL | Freq: Two times a day (BID) | ORAL | Status: DC
  Administered 2017-01-12 – 2017-01-16 (×9): 30 mL via ORAL
  Filled 2017-01-11 (×9): qty 30

## 2017-01-11 MED ORDER — SODIUM CHLORIDE 0.9 % IV SOLN
INTRAVENOUS | Status: DC | PRN
  Administered 2017-01-11: 15:00:00 via INTRAVENOUS

## 2017-01-11 MED ORDER — PROPOFOL 500 MG/50ML IV EMUL
INTRAVENOUS | Status: DC | PRN
  Administered 2017-01-11: 100 ug/kg/min via INTRAVENOUS

## 2017-01-11 NOTE — Progress Notes (Signed)
Subjective:  Patient seen laying in bed in no acute distress. Patient frustrated that he has to be NPO for study later today and inquires when he will be able to eat. No other acute complaints.   Objective:  Vital signs in last 24 hours: Vitals:   01/10/17 1142 01/10/17 1252 01/10/17 1950 01/11/17 0408  BP: (!) 119/52 (!) 141/68 (!) 178/62 (!) 153/49  Pulse: 84 84 93 88  Resp:  20 18 18   Temp:  98.2 F (36.8 C) 98.4 F (36.9 C) 99.2 F (37.3 C)  TempSrc:  Axillary Oral Oral  SpO2:  98% 92% 94%  Weight:      Height:       Physical Exam  Constitutional:  Elderly gentleman laying flat in bed comfortably in no acute distress  HENT:  Mouth/Throat: Oropharynx is clear and moist.  Cardiovascular: Normal rate, regular rhythm and intact distal pulses.  No murmur heard. Respiratory: Effort normal and breath sounds normal. No respiratory distress. He has no wheezes.  No crackles appreciated.  GI: Soft. Bowel sounds are normal. He exhibits no distension. There is no tenderness. There is no rebound.  Musculoskeletal: He exhibits no edema (of bilateral lower extremities).  Skin: Skin is warm and dry. No rash noted. No erythema.  Psychiatric:  Patient intermittently agitated with examiner. Affect and behavior appropriate.   Assessment/Plan:  Ross Bauer is a 72 y.o. male with history ofCOPD (no PFTs),CKD IV,chronic anemia,T2DM, HTN, HLD, and BPH who presented with altered mental status and found to have multifocal PNA,enterobacter UTI, and nonketotic hyperosmolar hyperglycemia.Transfer to ICU 12/27 after respiratory arrest.   Acute on chronic anemia:Hgb stable at 8.3 this AM from 7.9 yesterday. Patient NPO today for EGD to evaluate anemia and dysphagia. -Nephrology consulted, appreciate recommendations: Aranesp started 12/30, will have outpatient follow-up  -Gastroenterology consulted, recommendations appreciated -Continue PO protonix 40 mg QD  -Iron 325 with  breakfast -Transfusion goal Hgb 7point  Dysphagia: Swallow evaluation performed 12/25 and patient noted to cough and possibly aspirate during ingestion of liquids. He was started on dysphagia 1 diet. Per GI note 12/28, there is also concern of underlying esophageal mechanical issue vs malignancy.  -Continue Dysphagia 3 diet   Weakness: likely multifactorial in the setting of acute illness and poor PO intake for the past 2 weeks -PTrecommended SNF, patient ammenable  Acute on CKD IV: Baseline Cr ~3 and Cr 6 on presentation though to be secondary dehydration in the setting of poor PO intake x1-2 weeks prior to presentation. Renal functioncontinues to improve. Cr stable at 3.6. Will need outpatient follow up as above -Nephrology consulted, appreciate recommendations -Bicarb started 12/31  -Avoid nephrotoxic medications, daily BMP  T2DM: A1c 11.3 12/24 from 6.4 3 months ago. Presented with nonketotic hyperosmolar hyperglycemia requiring insulin gtt. Now on SQ insulin with appropriate CBG -Lantus 15U QHS  -M-SSI + CBG monitoring   COPD with Hx of tobacco abuse:Appears comfortable this morning and able to speak in full sentences on room air. Patient currently stable on 2L [place]. -Continue supplemental oxygen, wean as tolerated   HTN: On amlodipine 10, Coreg 25 BID and clonidine 0.1 BID.  Mildly hypertensive. -Continue home amlodipine 5 mg daily per nephrology  HLD:  - Continue home Lipitor 80 mg QD  FEN/GI: -NPO pending EGD -Protein supplements per nutrition -No IVF, replace electrolytes as needed  VTE Prophylaxis: SCD, ambulate as tolerated Code Status: Full  Dispo: Anticipated discharge in approximately 2-3 day(s) pending anemia workup and SNF placement.  Rozann Lesches, MD 01/11/2017, 9:17 AM Pager: [phone]

## 2017-01-11 NOTE — Progress Notes (Signed)
Nutrition Follow-up  DOCUMENTATION CODES:   Severe malnutrition in context of chronic illness  INTERVENTION:   -D/c Premier Protein due to poor acceptance -30 ml Prostat BID, each supplement provides 100 kcals and 15 grams protein  NUTRITION DIAGNOSIS:   Severe Malnutrition related to chronic illness as evidenced by moderate fat depletion, severe muscle depletion.  Ongoing  GOAL:   Patient will meet greater than or equal to 90% of their needs  Progressing  MONITOR:   PO intake, Supplement acceptance, I & O's, Labs, Weight trends  REASON FOR ASSESSMENT:   Malnutrition Screening Tool    ASSESSMENT:   Ross Bauer is a 72 yo M with a past medical history of CKD IV, DM, HTN, HLD, chronic anemia, BPH who presented to the ED with complaints of altered mental status. Found to have UTI, hyperosmolar nonketotic hyperglycemia and PNA. Now on NDD1/thin liquids per SLP.  12/26- s/p MBSS- advanced to dysphagia 1 diet with thin liquids 12/27- transferred to ICU due to respiratory distress 12/28- s/p BSE- advanced to dysphagia 3 diet with thin liquids 12/30- transferred from ICU to surgical floor   Pt NPO for EGD today due to heme positive stools.   Pt was previously on a dysphagia 3 diet with thin liquids. Intake has improved; noted meal completion 25-100%. Pt had been refusing Premier Protein supplements.   Medications reviewed and include ferrous sulfate and sodium bicarbonate.   Labs reviewed: Na: 132, K: 3.3, CBGS: 107-207 (inpatient orders for glucemic control are 0-15 units insulin aspart every 4 hours and 15 units insulin glargine daily).   Diet Order:  Diet NPO time specified  EDUCATION NEEDS:   Education needs have been addressed  Skin:  Skin Assessment: Reviewed RN Assessment  Last BM:  01/03/2017  Height:   Ht Readings from Last 1 Encounters:  01/03/17 5\' 6"  (1.676 m)    Weight:   Wt Readings from Last 1 Encounters:  01/04/17 135 lb 9.3 oz (61.5 kg)     Ideal Body Weight:  64.54 kg  BMI:  Body mass index is 21.88 kg/m.  Estimated Nutritional Needs:   Kcal:  1600-1900 calories  Protein:  92-104 grams (1.5g-1.7g/kg)  Fluid:  1.6-1.9L    Ross Vanderpol A. Mayford KnifeWilliams, RD, LDN, CDE Pager: [phone] After hours Pager: [phone]

## 2017-01-11 NOTE — Progress Notes (Signed)
     [facility] Progress Note  Chief Complaint:   Worsening anemia, dysphagia  Subjective: Hungry. No other complaints  Objective:  Vital signs in last 24 hours: Temp:  [98.2 F (36.8 C)-99.2 F (37.3 C)] 99.2 F (37.3 C) (01/02 0408) Pulse Rate:  [84-93] 88 (01/02 0408) Resp:  [18-20] 18 (01/02 0408) BP: (119-178)/(49-68) 153/49 (01/02 0408) SpO2:  [92 %-98 %] 94 % (01/02 0408) Last BM Date: 01/10/17 General:   Alert, well-developed, black male  in NAD EENT:  Normal hearing, non icteric sclera, conjunctive pink.  Heart:  Regular rate and rhythm Pulm: Normal respiratory effort on 02 per [place]. [name]. Abdomen:  Soft, nondistended, nontender.  Normal bowel sounds, no masses felt. No hepatomegaly.    Neurologic:  Alert and  oriented x4;  grossly normal neurologically. Psych:  Agitated about being NPO.    Intake/Output from previous day: 01/01 0701 - 01/02 0700 In: 591 [P.O.:591] Out: 1200 [Urine:1200] Intake/Output this shift: No intake/output data recorded.  Lab Results: Recent Labs    01/09/17 0358 01/10/17 0357 01/11/17 0612  WBC 9.0 7.8 8.0  HGB 8.7* 7.9* 8.3*  HCT 27.0* 23.9* 25.8*  PLT 272 261 289   BMET Recent Labs    01/09/17 0358 01/10/17 0357 01/11/17 0612  NA 129* 132* 132*  K 3.5 3.6 3.3*  CL 105 106 107  CO2 17* 19* 19*  GLUCOSE 157* 217* 128*  BUN 52* 47* 35*  CREATININE 3.53* 3.60* 3.24*  CALCIUM 7.4* 7.3* 7.4*   LFT Recent Labs    01/10/17 0357  ALBUMIN 1.2*    ASSESSMENT / PLAN:   1. Acute on chronic anemia in setting of sepsis and CKD / heme positive stools. Hgb mid 8, down from baseline of mid 11 (but that was in 2016). This admit hgb declined further to 5.1, back up to 8.3 after 3 units of blood. -Patient appears stable. Being treated for PNA. He is breathing fine on 02 per [place]. VSS. Has been NPO. Will proceed with EGD today.   2. AKI on CKD IV, felt to be from hypoperfusion (bradycardic episode) Cr improving. Nephrology  following.    3. PNA with associated sepsis. Stable  4. ? Dysphagia. One of the original complaints. Patient adamantly denies it to me. He is edentulous and can't chew food well enough to swallow it. MBSS supports prolonged mastication. Esophogram unremarkable but will further assess at time of EGD    Principal Problem:   Anemia in chronic kidney disease Active Problems:   Urinary tract infection without hematuria   Type 2 diabetes mellitus with hyperosmolar nonketotic hyperglycemia (HCC)   Multifocal pneumonia   Protein-calorie malnutrition, severe   Respiratory arrest (HCC)   Bradycardia   Acute respiratory failure with hypoxia (HCC)   Shock (HCC)   Acute on chronic renal insufficiency   CKD (chronic kidney disease) stage 5, GFR less than 15 ml/min (HCC)   Occult blood positive stool    LOS: 9 days   Willette ClusterPaula Avaleen Brownley ,NP 01/11/2017, 9:33 AM  Pager number [phone]

## 2017-01-11 NOTE — Discharge Instructions (Signed)
YOU HAD AN ENDOSCOPIC PROCEDURE TODAY: Refer to the procedure report and other information in the discharge instructions given to you for any specific questions about what was found during the examination. If this information does not answer your questions, please call [place] office at [phone] to clarify.  ° °YOU SHOULD EXPECT: Some feelings of bloating in the abdomen. Passage of more gas than usual. Walking can help get rid of the air that was put into your GI tract during the procedure and reduce the bloating. If you had a lower endoscopy (such as a colonoscopy or flexible sigmoidoscopy) you may notice spotting of blood in your stool or on the toilet paper. Some abdominal soreness may be present for a day or two, also. ° °DIET: Your first meal following the procedure should be a light meal and then it is ok to progress to your normal diet. A half-sandwich or bowl of soup is an example of a good first meal. Heavy or fried foods are harder to digest and may make you feel nauseous or bloated. Drink plenty of fluids but you should avoid alcoholic beverages for 24 hours. If you had a esophageal dilation, please see attached instructions for diet.   ° °ACTIVITY: Your care partner should take you home directly after the procedure. You should plan to take it easy, moving slowly for the rest of the day. You can resume normal activity the day after the procedure however YOU SHOULD NOT DRIVE, use power tools, machinery or perform tasks that involve climbing or major physical exertion for 24 hours (because of the sedation medicines used during the test).  ° °SYMPTOMS TO REPORT IMMEDIATELY: °A gastroenterologist can be reached at any hour. Please call [phone]  for any of the following symptoms:  °Following lower endoscopy (colonoscopy, flexible sigmoidoscopy) °Excessive amounts of blood in the stool  °Significant tenderness, worsening of abdominal pains  °Swelling of the abdomen that is new, acute  °Fever of 100° or  higher  °Following upper endoscopy (EGD, EUS, ERCP, esophageal dilation) °Vomiting of blood or coffee ground material  °New, significant abdominal pain  °New, significant chest pain or pain under the shoulder blades  °Painful or persistently difficult swallowing  °New shortness of breath  °Black, tarry-looking or red, bloody stools ° °FOLLOW UP:  °If any biopsies were taken you will be contacted by phone or by letter within the next 1-3 weeks. Call [phone]  if you have not heard about the biopsies in 3 weeks.  °Please also call with any specific questions about appointments or follow up tests. ° °

## 2017-01-11 NOTE — Transfer of Care (Signed)
Immediate Anesthesia Transfer of Care Note  Patient: Ross Bauer  Procedure(s) Performed: ESOPHAGOGASTRODUODENOSCOPY (EGD) (N/A )  Patient Location: Endoscopy Unit  Anesthesia Type:MAC  Level of Consciousness: awake, alert  and oriented  Airway & Oxygen Therapy: Patient Spontanous Breathing and Patient connected to nasal cannula oxygen  Post-op Assessment: Report given to RN and Post -op Vital signs reviewed and stable  Post vital signs: Reviewed and stable  Last Vitals:  Vitals:   01/11/17 1420 01/11/17 1541  BP: (!) 178/71 (!) 126/30  Pulse:  87  Resp: (!) 22 (!) 24  Temp: 36.9 C   SpO2: 92% 90%    Last Pain:  Vitals:   01/11/17 1420  TempSrc: Oral  PainSc:       Patients Stated Pain Goal: 2 (26/94/85 4627)  Complications: No apparent anesthesia complications

## 2017-01-11 NOTE — Progress Notes (Signed)
Medicine attending: I examined this patient today together with resident physician Dr. Shon HaleMary Beth Nedrud and I concur with her evaluation and management plan which we discussed together. Patient irate since he had to be n.p.o. for planned endoscopy.  No acute clinical change.  Hemoglobin stable over the last 24 hours. Endoscopic findings was a single nonbleeding angiectasia in the stomach which was laser coagulated. Patient will be observed overnight and if otherwise stable and bed available, discharged to skilled nursing facility within the next 24-48 hours. Renal function back to his previous baseline, creatinine 3.2, estimated GFR 20, stage IV renal insufficiency.

## 2017-01-11 NOTE — Progress Notes (Signed)
Physical Therapy Treatment Note  Patient is making gradual progress toward mobility goals and tolerated slight increase in gait distance this session. Pt with SOB and SpO2 desat to 82% on 2L O2 via [place]. RN notified of O2 desat. Continue to progress as tolerated with anticipated d/c to SNF for further skilled PT services.     01/11/17 1406  PT Visit Information  Last PT Received On 01/11/17  Assistance Needed +2  History of Present Illness 72 year old man with h/o HTN, anemia, hyperlipidemia, and DM admitted on December 24 with a 2-week history of upper respiratory symptoms, progressive generalized weakness, cough, and confusion.  He was hyperglycemic.  X-ray showed multifocal bilateral pulmonary infiltrates consistent with pneumonia.  He developed an episode of symptomatic bradycardia and an acute fall in his hemoglobin to 5 g from admission hemoglobin of 8.6 in the context of stage V renal insufficiency.  Precautions  Precautions Fall  Precaution Comments fell at home about 1-2 weeks PTA per daughter with vertigo episode  Restrictions  Weight Bearing Restrictions No  Pain Assessment  Pain Assessment Faces  Faces Pain Scale 6  Pain Location sore on bottom   Pain Descriptors / Indicators Sore;Grimacing;Guarding  Pain Intervention(s) Monitored during session;Repositioned  Cognition  Arousal/Alertness Awake/alert  Behavior During Therapy Flat affect  Overall Cognitive Status No family/caregiver present to determine baseline cognitive functioning  Area of Impairment Problem solving  Problem Solving Slow processing  General Comments HOH  Bed Mobility  Overal bed mobility Needs Assistance  Bed Mobility Supine to Sit  Supine to sit Mod assist;HOB elevated  General bed mobility comments cues for sequencing; assist to elevate trunk into sitting  Transfers  Overall transfer level Needs assistance  Equipment used Rolling walker (2 wheeled)  Transfers Sit to/from Stand  Sit to Stand +2  safety/equipment;Min assist;Mod assist  General transfer comment mod A +2 to power up into standing from EOB and min A +2 from recliner; cues for safe hand placement  Ambulation/Gait  Ambulation/Gait assistance Mod assist;+2 safety/equipment (chair follow)  Ambulation Distance (Feet) 40 Feet  Assistive device Rolling walker (2 wheeled)  Gait Pattern/deviations Shuffle;Step-to pattern;Trunk flexed;Wide base of support;Decreased step length - right;Decreased step length - left;Decreased dorsiflexion - right;Decreased dorsiflexion - left  General Gait Details multimodal cues for posture and safe use of AD; pt tends to keep RW too far forward and required assist to manage RW safely; pt fatigued quickly  Balance  Overall balance assessment Needs assistance  Sitting-balance support Feet supported;Bilateral upper extremity supported  Sitting balance-Leahy Scale Fair  Standing balance support Bilateral upper extremity supported  Standing balance-Leahy Scale Poor  General Comments  General comments (skin integrity, edema, etc.) pt on 2L O2 via [place] upon arrival. in supine SpO2 84% on 2L and with mobility desat to 82% on 2L and up to 87% on 4L with cues for PLB. pt left on 3L O2 via  end of session and RN notified  PT - End of Session  Equipment Utilized During Treatment Gait belt  Activity Tolerance Patient limited by fatigue  Patient left in chair;with call bell/phone within reach;with chair alarm set  Nurse Communication Mobility status;Other (comment) (SpO2 desat)  PT - Assessment/Plan  PT Plan Current plan remains appropriate  PT Visit Diagnosis Other abnormalities of gait and mobility (R26.89);Muscle weakness (generalized) (M62.81)  PT Frequency (ACUTE ONLY) Min 3X/week  Follow Up Recommendations SNF  PT equipment Other (comment) (TBA)  AM-PAC PT "6 Clicks" Daily Activity Outcome Measure  Difficulty turning over  in bed (including adjusting bedclothes, sheets and blankets)? 2  Difficulty  moving from lying on back to sitting on the side of the bed?  1  Difficulty sitting down on and standing up from a chair with arms (e.g., wheelchair, bedside commode, etc,.)? 1  Help needed moving to and from a bed to chair (including a wheelchair)? 2  Help needed walking in hospital room? 2  Help needed climbing 3-5 steps with a railing?  1  6 Click Score 9  Mobility G Code  CL  PT Goal Progression  Progress towards PT goals Progressing toward goals  Acute Rehab PT Goals  PT Goal Formulation With patient/family  Time For Goal Achievement 01/22/17  Potential to Achieve Goals Good  PT Time Calculation  PT Start Time (ACUTE ONLY) 1145  PT Stop Time (ACUTE ONLY) 1210  PT Time Calculation (min) (ACUTE ONLY) 25 min  PT General Charges  $$ ACUTE PT VISIT 1 Visit  PT Treatments  $Gait Training 8-22 mins  $Therapeutic Activity 8-22 mins   Erline LevineKellyn Krishan Mcbreen, PTA Pager: [phone]

## 2017-01-11 NOTE — Care Management Important Message (Signed)
Important Message  Patient Details  Name: Ross Bauer MRN: [id number] Date of Birth: 09/01/1945   Medicare Important Message Given:  Yes    [name] 01/11/2017, 7:50 AM

## 2017-01-11 NOTE — Anesthesia Preprocedure Evaluation (Signed)
Anesthesia Evaluation  Patient identified by MRN, date of birth, ID band Patient awake    Reviewed: Allergy & Precautions, H&P , NPO status , Patient's Chart, lab work & pertinent test results  Airway Mallampati: II   Neck ROM: full    Dental   Pulmonary Current Smoker,    breath sounds clear to auscultation       Cardiovascular hypertension, + Peripheral Vascular Disease   Rhythm:regular Rate:Normal     Neuro/Psych    GI/Hepatic   Endo/Other  diabetes, Type 2  Renal/GU Renal InsufficiencyRenal disease     Musculoskeletal   Abdominal   Peds  Hematology  (+) Blood dyscrasia, anemia ,   Anesthesia Other Findings   Reproductive/Obstetrics                             Anesthesia Physical Anesthesia Plan  ASA: IV  Anesthesia Plan: MAC   Post-op Pain Management:    Induction: Intravenous  PONV Risk Score and Plan: 0 and Treatment may vary due to age or medical condition and Propofol infusion  Airway Management Planned: Nasal Cannula  Additional Equipment:   Intra-op Plan:   Post-operative Plan:   Informed Consent: I have reviewed the patients History and Physical, chart, labs and discussed the procedure including the risks, benefits and alternatives for the proposed anesthesia with the patient or authorized representative who has indicated his/her understanding and acceptance.     Plan Discussed with: CRNA, Anesthesiologist and Surgeon  Anesthesia Plan Comments:         Anesthesia Quick Evaluation

## 2017-01-11 NOTE — Social Work (Signed)
CSW met with pt at bedside, pt was heading to scheduled procedure.   CSW provided pt with options for SNF, pt would like to look over options and for CSW to call daughter April to discuss options as well.   5:33pm-  CSW contacted [facility] and was told pt is no longer managed by [facility], will continue to explore pt insurance coverage in the morning.  CSW continuing to follow to support discharge to SNF when medically appropriate.   Alexander Mt, Nora Work [phone]

## 2017-01-11 NOTE — Consult Note (Signed)
[facility] CM Primary Care Navigator  01/11/2017  Ross Bauer 1945/07/22 [id number]    Went to see patientin the roomto identify possible discharge needs but he is off the unit for a procedure in Endo per RN report.     (ESOPHAGOGASTRODUODENOSCOPY)  Will attempt to see patient at another time when he is available in the room.    Addendum (01/12/17):  Went back to see patient in the room to identify possible discharge needs and met with daughter (Ross Bauer). Daughter expressed willingness to discuss health issues on patient's behalf.   Daughterreports that patient had couple of weeks history of upper respiratory symptoms, progressive generalized weakness, cough, and confusion. He was very hyperglycemic on admission with chest x-ray that showed multifocal bilateral pulmonary infiltrates consistent with pneumoniawhich all resulted to this admission.  Patient's daughter [facility].   [facility] [address] without anyproblem so far.  Daughter reportsthat patient has been managinghis ownmedicationsat home but will be taken over by daughter- Ross Bauer and grandson Ross Bauer) when he returns back home.  Patienthas been driving prior to admission but daughter verbalized that she will be going with patient and providing transportation to hisdoctors'appointments once discharged.  [employer] transportation benefits was also discussed with daughter.  Patient lives with grandson prior to admission. Daughter reports that patient had been "very stubborn" in managing his health- just doing what he wants or wishes. Daughter states that she will serve as the primary caregiver for patient after discharge with the help of patient's grandson.   Anticipated discharge plan is skilled nursing facility (SNF- in process)for rehabilitationper  therapy recommendation prior to returning home.   Patient's daughtervoiced understanding to callprimary care provider's officewhen patientgets backhometo schedulea post discharge follow-upvisit within 1-2 weeks or sooner if needed. Patient letter (with PCP's contact number) was provided as areminder.  Explained to patient's daughter about[facility] CM services available for health management at home(mainly DM/ COPD) and hadexpressed great interest about it.  Patient's daughter had voiced understandingof need to seek referral from primary care provider to [facility] care management asdeemed necessary and appropriatefor services in the near future-on his next follow-up visit or when hereturns back home.  [facility] care management information provided for future needs that may arise.   For additional questions please contact:  Edwena Felty A. Bhavin Monjaraz, BSN, RN-BC [facility] PRIMARY CARE Navigator Cell: [phone]

## 2017-01-11 NOTE — Op Note (Signed)
[facility] Patient Name: Ross MileJulius Reeb Procedure Date : 01/11/2017 MRN: [id number] Attending MD: Beverley FiedlerJay M Chani Ghanem , MD Date of Birth: 02/25/1945 CSN: [id number] Age: 72 Admit Type: Inpatient Procedure:                Upper GI endoscopy Indications:              Heme positive stool; multifactorial anemia in                            setting of CKD Providers:                Carie CaddyJay M. Rhea BeltonPyrtle, MD, Jacquiline DoeJennifer Zhu, RN, Harrington ChallengerHope Parker,                            Technician Referring MD:             [facility] Medicines:                Monitored Anesthesia Care Complications:            No immediate complications. Estimated Blood Loss:     Estimated blood loss: none. Procedure:                Pre-Anesthesia Assessment:                           - Prior to the procedure, a History and Physical                            was performed, and patient medications and                            allergies were reviewed. The patient's tolerance of                            previous anesthesia was also reviewed. The risks                            and benefits of the procedure and the sedation                            options and risks were discussed with the patient.                            All questions were answered, and informed consent                            was obtained. Prior Anticoagulants: The patient has                            taken no previous anticoagulant or antiplatelet                            agents. ASA Grade Assessment: III - A patient with  severe systemic disease. After reviewing the risks                            and benefits, the patient was deemed in                            satisfactory condition to undergo the procedure.                           After obtaining informed consent, the endoscope was                            passed under direct vision. Throughout the                            procedure, the  patient's blood pressure, pulse, and                            oxygen saturations were monitored continuously. The                            EG-[id number] ([id number]) scope was introduced through the                            mouth, and advanced to the second part of duodenum.                            The upper GI endoscopy was accomplished without                            difficulty. The patient tolerated the procedure                            well. Scope In: Scope Out: Findings:      The examined esophagus was normal.      A single 5 mm no bleeding angioectasia was found in the cardia versus       proximal gastric body. Area around the lesion was successfully injected       with 2 mL of a 1:10,000 solution of epinephrine for improved access (by       lifting the lesion prior to destruction and too prevent bleeding with       APC treatment). Fulguration to ablate the lesion to prevent future       bleeding by argon plasma at 1 liter/minute and 20 watts was successful.      The exam of the stomach was otherwise normal.      The examined duodenum was normal. Impression:               - Normal esophagus.                           - A single non-bleeding angioectasia in the                            stomach. Injected. Ablated with argon plasma  coagulation (APC).                           - Normal examined duodenum.                           - No specimens collected. Moderate Sedation:      N/A Recommendation:           - Patient has a contact number available for                            emergencies. The signs and symptoms of potential                            delayed complications were discussed with the                            patient. Return to normal activities tomorrow.                            Written discharge instructions were provided to the                            patient.                           - Full liquid diet. If tolerated  advance to soft                            diet.                           - Continue present medications. Daily PPI.                           - Monitor Hbg to ensure improvement. Procedure Code(s):        --- Professional ---                           [phone], Esophagogastroduodenoscopy, flexible,                            transoral; with control of bleeding, any method                           43236, 59, Esophagogastroduodenoscopy, flexible,                            transoral; with directed submucosal injection(s),                            any substance Diagnosis Code(s):        --- Professional ---                           K31.819, Angiodysplasia of stomach and duodenum  without bleeding                           R19.5, Other fecal abnormalities CPT copyright 2016 American Medical Association. All rights reserved. The codes documented in this report are preliminary and upon coder review may  be revised to meet current compliance requirements. Beverley Fiedler, MD 01/11/2017 3:44:25 PM This report has been signed electronically. Number of Addenda: 0

## 2017-01-12 ENCOUNTER — Encounter: Payer: Self-pay | Admitting: Nurse Practitioner

## 2017-01-12 ENCOUNTER — Inpatient Hospital Stay (HOSPITAL_COMMUNITY): Payer: Medicare HMO

## 2017-01-12 DIAGNOSIS — E8809 Other disorders of plasma-protein metabolism, not elsewhere classified: Secondary | ICD-10-CM

## 2017-01-12 DIAGNOSIS — D631 Anemia in chronic kidney disease: Secondary | ICD-10-CM

## 2017-01-12 DIAGNOSIS — R195 Other fecal abnormalities: Secondary | ICD-10-CM

## 2017-01-12 DIAGNOSIS — N184 Chronic kidney disease, stage 4 (severe): Secondary | ICD-10-CM

## 2017-01-12 LAB — CBC
HCT: 28.1 % — ABNORMAL LOW (ref 39.0–52.0)
Hemoglobin: 9.1 g/dL — ABNORMAL LOW (ref 13.0–17.0)
MCH: 29.6 pg (ref 26.0–34.0)
MCHC: 32.4 g/dL (ref 30.0–36.0)
MCV: 91.5 fL (ref 78.0–100.0)
Platelets: 331 10*3/uL (ref 150–400)
RBC: 3.07 MIL/uL — ABNORMAL LOW (ref 4.22–5.81)
RDW: 17.1 % — AB (ref 11.5–15.5)
WBC: 10.3 10*3/uL (ref 4.0–10.5)

## 2017-01-12 LAB — GLUCOSE, CAPILLARY
GLUCOSE-CAPILLARY: 84 mg/dL (ref 65–99)
Glucose-Capillary: 112 mg/dL — ABNORMAL HIGH (ref 65–99)
Glucose-Capillary: 133 mg/dL — ABNORMAL HIGH (ref 65–99)
Glucose-Capillary: 141 mg/dL — ABNORMAL HIGH (ref 65–99)
Glucose-Capillary: 147 mg/dL — ABNORMAL HIGH (ref 65–99)
Glucose-Capillary: 247 mg/dL — ABNORMAL HIGH (ref 65–99)

## 2017-01-12 MED ORDER — FUROSEMIDE 80 MG PO TABS
80.0000 mg | ORAL_TABLET | Freq: Once | ORAL | Status: AC
  Administered 2017-01-12: 80 mg via ORAL
  Filled 2017-01-12: qty 1

## 2017-01-12 NOTE — Social Work (Addendum)
CSW spoke with pt daughter Babette Relicammy, who is a Child psychotherapistsocial worker. Pt daughter states that pt still has [employer] Medicare.  Tammy stated that she and her sister April were still okay with pt going to SNF placement. They would like to discuss options together, are aware that two options are best and they will have those available for CSW tomorrow.   April will be in this evening, CSW will leave another packet of SNF options for pt daughters.   Doy HutchingIsabel H Tashena Ibach, LCSWA [facility] Clinical Social Work [phone]

## 2017-01-12 NOTE — Anesthesia Postprocedure Evaluation (Signed)
Anesthesia Post Note  Patient: Ross Bauer  Procedure(s) Performed: ESOPHAGOGASTRODUODENOSCOPY (EGD) (N/A )     Patient location during evaluation: PACU Anesthesia Type: MAC Level of consciousness: awake and alert Pain management: pain level controlled Vital Signs Assessment: post-procedure vital signs reviewed and stable Respiratory status: spontaneous breathing, nonlabored ventilation, respiratory function stable and patient connected to nasal cannula oxygen Cardiovascular status: stable and blood pressure returned to baseline Postop Assessment: no apparent nausea or vomiting Anesthetic complications: no    Last Vitals:  Vitals:   01/12/17 0219 01/12/17 0544  BP: (!) 186/81 (!) 135/42  Pulse: 100 97  Resp: 20 18  Temp: 37.1 C 37.3 C  SpO2: 97% (!) 5%    Last Pain:  Vitals:   01/12/17 0544  TempSrc: Oral  PainSc:                  [place]

## 2017-01-12 NOTE — Progress Notes (Signed)
     [facility] Progress Note  Chief Complaint:    Worsening anemia   Subjective: Thirsty, otherwise no complaints  Objective:  Vital signs in last 24 hours: Temp:  [98.2 F (36.8 C)-99.2 F (37.3 C)] 99.2 F (37.3 C) (01/03 0544) Pulse Rate:  [87-100] 97 (01/03 0544) Resp:  [18-24] 18 (01/03 0544) BP: (126-186)/(30-81) 135/42 (01/03 0544) SpO2:  [5 %-98 %] 5 % (01/03 0544) Last BM Date: 01/10/17 General:   Alert, well-developed, black male in NAD Heart:  Regular rate and rhythm, no lower extremity edema Pulm: Breathing slightly labored, on 02 per [place] Abdomen:  Soft, nondistended, nontender.  Normal bowel sounds, no masses felt. No hepatomegaly.    Neurologic:  Alert and  oriented x4;  grossly normal neurologically. Psych:  cooperative.  Agitated   Intake/Output from previous day: 01/02 0701 - 01/03 0700 In: 682 [P.O.:582; I.V.:100] Out: 1735 [Urine:1735] Intake/Output this shift: No intake/output data recorded.  Lab Results: Recent Labs    01/10/17 0357 01/11/17 0612  WBC 7.8 8.0  HGB 7.9* 8.3*  HCT 23.9* 25.8*  PLT 261 289   BMET Recent Labs    01/10/17 0357 01/11/17 0612  NA 132* 132*  K 3.6 3.3*  CL 106 107  CO2 19* 19*  GLUCOSE 217* 128*  BUN 47* 35*  CREATININE 3.60* 3.24*  CALCIUM 7.3* 7.4*   LFT Recent Labs    01/10/17 0357  ALBUMIN 1.2*    ASSESSMENT / PLAN:   1. 72 yo male with heme positive stools and acute on chronic anemia in setting of sepsis and CKD. He is s/p 3 units of blood. Hgb stable at 8.3 since transfusion.   EGD yesterday >> one non-bleeding angioectasia s/p epi and APC -continue daily PPI -follow up with me 3 weeks - appt in Epic  2. AKI on CKD IV. Cr slowly improving.   3. PNA with associated sepsis  4. Severe hypoalbuminemia  Discharge Planning Diet: no GI restrictions Anticoagulation and antiplatelets:  N/A Discharge Medications: Daily PPI   Principal Problem:   Anemia in chronic kidney  disease Active Problems:   Urinary tract infection without hematuria   Type 2 diabetes mellitus with hyperosmolar nonketotic hyperglycemia (HCC)   Multifocal pneumonia   Protein-calorie malnutrition, severe   Respiratory arrest (HCC)   Bradycardia   Acute respiratory failure with hypoxia (HCC)   Shock (HCC)   Acute on chronic renal insufficiency   CKD (chronic kidney disease) stage 5, GFR less than 15 ml/min (HCC)   Occult blood positive stool   Gastric and duodenal angiodysplasia     LOS: 10 days   Willette ClusterPaula Jaylnn Ullery ,NP 01/12/2017, 11:15 AM  Pager number [phone]

## 2017-01-12 NOTE — Progress Notes (Signed)
  Speech Language Pathology Treatment: Dysphagia  Patient Details Name: Ross Bauer MRN: [id number] DOB: 08/16/1945 Today's Date: 01/12/2017 Time: [id number] SLP Time Calculation (min) (ACUTE ONLY): 10 min  Assessment / Plan / Recommendation Clinical Impression  Pt would not tolerate sitting fully upright today because he said it felt like he could not breathe. He did try to drink some water while lying partially reclined, and although he did have some delayed coughing noted, he is coughing at baseline today as well. SLP provided education about the importance of sitting fully upright and taking additional rest breaks given his respiratory status today, and encouraged him to take a break for now since he felt like he needed to lay down. SLP will continue to follow given change in respirations today.   HPI HPI: 72 [facility] DM with hypersomolar nonketotic hyperglycemia and found to have pna.  PMH + for DM, confusion, poor po intake, weakness, rapid response called with pt with RR 30s to 40s, 90s oxygen saturation on 5 liters oxygen.  Per family member in room, pt with poor intake prior to admit and episode of vertigo a few weeks ago.  Per daughter, pt refused to go to MD with vertigo episode.  He also had dysphagia prior to admit with symptoms including sensation of residuals in pharynx, oral holding and bringing food/drink up after intake.  Today pt is fully alert with clear voice. [name].        SLP Plan  Continue with current plan of care       Recommendations  Diet recommendations: Dysphagia 3 (mechanical soft);Thin liquid Liquids provided via: Cup;Straw Medication Administration: Whole meds with puree Supervision: Patient able to self feed;Full supervision/cueing for compensatory strategies Compensations: Slow rate;Small sips/bites Postural Changes and/or Swallow Maneuvers: Seated upright 90 degrees                Oral Care Recommendations: Oral care BID Follow  up Recommendations: Skilled Nursing facility SLP Visit Diagnosis: Dysphagia, oral phase (R13.11) Plan: Continue with current plan of care       GO                Maxcine Hamaiewonsky, Westen Dinino 01/12/2017, 3:00 PM  Maxcine HamLaura Paiewonsky, M.A. CCC-SLP [phone]

## 2017-01-12 NOTE — Progress Notes (Signed)
SATURATION QUALIFICATIONS: (This note is used to comply with regulatory documentation for home oxygen)  Patient Saturations on Room Air at Rest = 83%  Patient Saturations on Room Air while Ambulating = unable to get accurate reading%  Patient Saturations on  4 Liters of oxygen while Ambulating = 88%  Please briefly explain why patient needs home oxygen: Patient is unable to maintain SpO2 sats 90% or > on RA.   Erline LevineKellyn Dior Stepter, PTA Pager: [phone]

## 2017-01-12 NOTE — Progress Notes (Signed)
Subjective:  Patient seen working with PT staff and sitting comfortably in bedside chair in no acute distress. Patient wanted providers to speak with his daughter today over the phone regarding his care.  Objective:  Vital signs in last 24 hours: Vitals:   01/11/17 1810 01/11/17 2118 01/12/17 0219 01/12/17 0544  BP: (!) 142/58 (!) 147/57 (!) 186/81 (!) 135/42  Pulse: 89 96 100 97  Resp: 18 18 20 18   Temp: 98.4 F (36.9 C) 98.2 F (36.8 C) 98.8 F (37.1 C) 99.2 F (37.3 C)  TempSrc: Oral Other (Comment) Oral Oral  SpO2: 96% 98% 97% (!) 5%  Weight:      Height:       Physical Exam  Constitutional:  Elderly, sick appearing gentleman sitting in chair in no acute distress  HENT:  Mouth/Throat: Oropharynx is clear and moist.  Cardiovascular: Normal rate, regular rhythm and intact distal pulses.  No murmur heard. Respiratory: Effort normal and breath sounds normal. No respiratory distress. He has no wheezes.  Increased respiratory rate without evidence of nasal flaring or accessory muscle use. Crackles throughout bilateral lung fields.  Musculoskeletal: He exhibits no edema (of bilateral lower extremities) or tenderness (of bilateral lower extremities).  Skin: Skin is warm and dry. No rash noted. No erythema.  Psychiatric:  Patient intermittently agitated with examiner. Affect and behavior appropriate.   Assessment/Plan:  Ross Bauer is a 72 y.o. male with history ofCOPD (no PFTs),CKD IV,chronic anemia,T2DM, HTN, HLD, and BPH who presented with altered mental status and found to have multifocal PNA,enterobacter UTI, and nonketotic hyperosmolar hyperglycemia.Transfer to ICU 12/27 after respiratory arrest.   Acute on chronic anemia:Patient had EGD yesterday with ligation of small angioectasia. Patient has no complaints today. -Nephrology consulted, appreciate recommendations: Aranesp started 12/30, will have outpatient follow-up  -Continue PO protonix 40 mg QD  -Iron  325 with breakfast -Transfusion goal Hgb 7point  Dysphagia: Swallow evaluation performed 12/25 and patient noted to cough and possibly aspirate during ingestion of liquids. Evaluated by speech for dysphagia. EGD without structural abnormalities that would explain this symptom -Continue Dysphagia 3 diet   Worsening SOB in setting of COPD, Hx of tobacco abuse:Patient appears more uncomfortably on exam today, per chart review patient's oxygen requirement has increased to 4L from 2L yesterday. Patient also had bilateral crackles today, which was not appreciated on yesterdays exam. Patient afebrile and hemodynamically stable. Patient has chronic lung disease which could explain hypoxia at baseline but not an acute change. Differential includes pneumonia, aspiration event given dysphagia and recent EGD, and/or pulmonary embolism (patient only on SCD's for VTE prophylaxis 2/2 anemia as above) -Continue supplemental oxygen, wean as tolerated  -2 view chest xray, to examine for new infiltrate (aspiration vs new pneumonia)  Acute on CKD IV: Baseline Cr ~3 and Cr 6 on presentation though to be secondary dehydration in the setting of poor PO intake x1-2 weeks prior to presentation. Renal functioncontinues to improve. Cr stable at baseline. -Nephrology consulted, appreciate recommendations -Bicarb started 12/31  -Avoid nephrotoxic medications, daily BMP  T2DM: A1c 11.3 12/24 from 6.4 3 months ago. Presented with nonketotic hyperosmolar hyperglycemia requiring insulin gtt. Now on SQ insulin with appropriate CBG -Lantus 15U QHS  -M-SSI + CBG monitoring   HTN: On amlodipine 10, Coreg 25 BID and clonidine 0.1 BID.  Mildly hypertensive. -Continue home amlodipine 5 mg daily per nephrology  HLD:  -Continue home Lipitor 80 mg QD  FEN/GI: -Dysphagia diet -Protein supplements per nutrition -No IVF, replace electrolytes  as needed  VTE Prophylaxis: SCD, ambulate as tolerated Code Status: Full  Dispo:  Anticipated discharge in approximately 1-2 day pending SNF placement.  Rozann Lesches, MD 01/12/2017, 2:15 PM Pager: [phone]

## 2017-01-12 NOTE — Progress Notes (Signed)
Physical Therapy Treatment Patient Details Name: Ross ManifoldJulius L Prout MRN: [id number] DOB: 07/05/1945 Today's Date: 01/12/2017    History of Present Illness 72 year old man with h/o HTN, anemia, hyperlipidemia, and DM admitted on December 24 with a 2-week history of upper respiratory symptoms, progressive generalized weakness, cough, and confusion.  He was hyperglycemic.  X-ray showed multifocal bilateral pulmonary infiltrates consistent with pneumonia.  He developed an episode of symptomatic bradycardia and an acute fall in his hemoglobin to 5 g from admission hemoglobin of 8.6 in the context of stage V renal insufficiency.    PT Comments    Patient is making progress toward mobility goals and tolerated ambulating 165ft X2. Pt with 2/4 DOE, HR elevated to 122, and SpO2 88% on 4L O2 via [place] with ambulation. Pt with SpO2 desat to 83% on RA at rest. Continue to progress as tolerated.    Follow Up Recommendations  SNF     Equipment Recommendations  Other (comment)(TBA)    Recommendations for Other Services       Precautions / Restrictions Precautions Precautions: Fall Precaution Comments: fell at home about 1-2 weeks PTA per daughter with vertigo episode    Mobility  Bed Mobility Overal bed mobility: Needs Assistance Bed Mobility: Supine to Sit     Supine to sit: Min assist     General bed mobility comments: assist to elevate trunk   Transfers Overall transfer level: Needs assistance Equipment used: Rolling walker (2 wheeled) Transfers: Sit to/from Stand Sit to Stand: Min assist         General transfer comment: assist to steady upon standing X2 trials; cues for safe hand placement  Ambulation/Gait Ambulation/Gait assistance: +2 safety/equipment;Min assist(chair follow) Ambulation Distance (Feet): (2865ft X2) Assistive device: Rolling walker (2 wheeled) Gait Pattern/deviations: Trunk flexed;Wide base of support;Decreased step length - right;Decreased step length -  left;Decreased dorsiflexion - right;Decreased dorsiflexion - left;Step-through pattern     General Gait Details: multimodal cues for posture and vc for safe use of AD and breathing technique; 2/4 DOE, HR up to 120s, and SpO2 88% on 4L O2 via [place] while ambulating; one seated rest break   Stairs            Wheelchair Mobility    Modified Rankin (Stroke Patients Only)       Balance Overall balance assessment: Needs assistance Sitting-balance support: Feet supported;Bilateral upper extremity supported Sitting balance-Leahy Scale: Fair     Standing balance support: Bilateral upper extremity supported Standing balance-Leahy Scale: Poor                              Cognition Arousal/Alertness: Awake/alert Behavior During Therapy: Flat affect Overall Cognitive Status: No family/caregiver present to determine baseline cognitive functioning Area of Impairment: Problem solving                             Problem Solving: Slow processing General Comments: HOH      Exercises      General Comments General comments (skin integrity, edema, etc.): SpO2 desat to 83% on RA at rest      Pertinent Vitals/Pain Pain Assessment: Faces Faces Pain Scale: No hurt Pain Intervention(s): Monitored during session    Home Living                      Prior Function  PT Goals (current goals can now be found in the care plan section) Acute Rehab PT Goals Patient Stated Goal: To go home PT Goal Formulation: With patient/family Time For Goal Achievement: 01/22/17 Potential to Achieve Goals: Good Progress towards PT goals: Progressing toward goals    Frequency    Min 3X/week      PT Plan Current plan remains appropriate    Co-evaluation              AM-PAC PT "6 Clicks" Daily Activity  Outcome Measure  Difficulty turning over in bed (including adjusting bedclothes, sheets and blankets)?: A Lot Difficulty moving from lying on  back to sitting on the side of the bed? : Unable Difficulty sitting down on and standing up from a chair with arms (e.g., wheelchair, bedside commode, etc,.)?: Unable Help needed moving to and from a bed to chair (including a wheelchair)?: A Little Help needed walking in hospital room?: A Little Help needed climbing 3-5 steps with a railing? : A Lot 6 Click Score: 12    End of Session Equipment Utilized During Treatment: Gait belt Activity Tolerance: Patient tolerated treatment well Patient left: in chair;with call bell/phone within reach;with chair alarm set;Other (comment)(MDs present) Nurse Communication: Mobility status;Other (comment)(SpO2 desat) PT Visit Diagnosis: Other abnormalities of gait and mobility (R26.89);Muscle weakness (generalized) (M62.81)     Time: 9604-5409 PT Time Calculation (min) (ACUTE ONLY): 23 min  Charges:  $Gait Training: 8-22 mins $Therapeutic Activity: 8-22 mins                    G Codes:       Erline Levine, PTA Pager: [phone]     Carolynne Edouard 01/12/2017, 11:17 AM

## 2017-01-13 DIAGNOSIS — E8779 Other fluid overload: Secondary | ICD-10-CM

## 2017-01-13 LAB — BASIC METABOLIC PANEL
Anion gap: 6 (ref 5–15)
BUN: 29 mg/dL — AB (ref 6–20)
CALCIUM: 7.2 mg/dL — AB (ref 8.9–10.3)
CO2: 21 mmol/L — AB (ref 22–32)
Chloride: 106 mmol/L (ref 101–111)
Creatinine, Ser: 2.97 mg/dL — ABNORMAL HIGH (ref 0.61–1.24)
GFR calc Af Amer: 23 mL/min — ABNORMAL LOW (ref >60)
GFR, EST NON AFRICAN AMERICAN: 19 mL/min — AB (ref >60)
GLUCOSE: 158 mg/dL — AB (ref 65–99)
POTASSIUM: 3.5 mmol/L (ref 3.5–5.1)
Sodium: 133 mmol/L — ABNORMAL LOW (ref 135–145)

## 2017-01-13 LAB — GLUCOSE, CAPILLARY
GLUCOSE-CAPILLARY: 200 mg/dL — AB (ref 65–99)
GLUCOSE-CAPILLARY: 231 mg/dL — AB (ref 65–99)
GLUCOSE-CAPILLARY: 165 mg/dL — AB (ref 65–99)
Glucose-Capillary: 65 mg/dL (ref 65–99)
Glucose-Capillary: 139 mg/dL — ABNORMAL HIGH (ref 65–99)
Glucose-Capillary: 144 mg/dL — ABNORMAL HIGH (ref 65–99)

## 2017-01-13 LAB — CBC
HEMATOCRIT: 25.9 % — AB (ref 39.0–52.0)
Hemoglobin: 8.1 g/dL — ABNORMAL LOW (ref 13.0–17.0)
MCH: 28.3 pg (ref 26.0–34.0)
MCHC: 31.3 g/dL (ref 30.0–36.0)
MCV: 90.6 fL (ref 78.0–100.0)
PLATELETS: 308 10*3/uL (ref 150–400)
RBC: 2.86 MIL/uL — ABNORMAL LOW (ref 4.22–5.81)
RDW: 16.9 % — AB (ref 11.5–15.5)
WBC: 8.8 10*3/uL (ref 4.0–10.5)

## 2017-01-13 LAB — BRAIN NATRIURETIC PEPTIDE: B Natriuretic Peptide: 148 pg/mL — ABNORMAL HIGH (ref 0.0–100.0)

## 2017-01-13 MED ORDER — IPRATROPIUM-ALBUTEROL 0.5-2.5 (3) MG/3ML IN SOLN: 3.0000 mL | RESPIRATORY_TRACT | Status: DC

## 2017-01-13 MED ORDER — POTASSIUM CHLORIDE CRYS ER 20 MEQ PO TBCR
20.0000 meq | EXTENDED_RELEASE_TABLET | Freq: Once | ORAL | Status: AC
  Administered 2017-01-13: 20 meq via ORAL
  Filled 2017-01-13: qty 1

## 2017-01-13 MED ORDER — IPRATROPIUM-ALBUTEROL 0.5-2.5 (3) MG/3ML IN SOLN: 3.0000 mL | RESPIRATORY_TRACT | Status: DC | PRN

## 2017-01-13 MED ORDER — FUROSEMIDE 80 MG PO TABS
80.0000 mg | ORAL_TABLET | Freq: Once | ORAL | Status: AC
  Administered 2017-01-13: 80 mg via ORAL
  Filled 2017-01-13: qty 1

## 2017-01-13 MED ORDER — IPRATROPIUM-ALBUTEROL 0.5-2.5 (3) MG/3ML IN SOLN
3.0000 mL | RESPIRATORY_TRACT | Status: DC
  Administered 2017-01-13: 3 mL via RESPIRATORY_TRACT
  Filled 2017-01-13: qty 3

## 2017-01-13 MED ORDER — IPRATROPIUM-ALBUTEROL 0.5-2.5 (3) MG/3ML IN SOLN
3.0000 mL | RESPIRATORY_TRACT | Status: DC | PRN
  Administered 2017-01-15: 3 mL via RESPIRATORY_TRACT
  Filled 2017-01-13: qty 3

## 2017-01-13 NOTE — Progress Notes (Signed)
Medicine attending discharge note: I personally examined this patient on the day of discharge and I attest to the accuracy of the discharge evaluation and plan as recorded in the final progress note by resident physician Dr. Rozann LeschesMarybeth Nedrud.  72 year old man admitted on January 02, 2017 with a 2-week history of an upper respiratory tract infection.  He developed worsening cough, generalized weakness, unsteady gait, and confusion.  He is a diabetic.  He was brought to the hospital where he was found to be hyperglycemic, hyperkalemic, and severely dehydrated.  Multifocal pneumonia with bilateral pulmonary infiltrates on x-ray.  Anemic with hemoglobin 8.6.  No leukocytosis.  He was acidotic with pH 7.19.  Lactic acid recorded on December 26: 4.7. Hydration and parenteral antibiotics initiated.  Condition initially stabilized until hospital day 2 when he developed a syncopal episode when returning from the bathroom.  He became bradycardic down in the 30s and was minimally responsive.  He was treated with atropine, calcium, and bicarbonate and transferred to the intensive care unit.  Laboratory studies showed an acute fall in his hemoglobin down to 5 g.  No active bleeding but stools were guaiac positive.  He was transfused to a stable hemoglobin.  Overall condition stabilized and he was transferred back to the general medical service on December 29.  Blood cultures remain sterile.  Urine grew Enterobacter species. He was deconditioned.  After initial transfusion to hemoglobin 8.4, hemoglobin drifted down to 6.4 and additional transfusions given.  Hemoglobin 8.1 at time of discharge.  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  The lesion was not bleeding at time of the procedure. Anemia felt to be multifactorial including contributions from sepsis, acute on chronic renal insufficiency, and chronic illness.   Baseline stage IV renal insufficiency with BUN 36, creatinine 2.5 in May 2017, increasing to stage V on admission with BUN 127 and creatinine 6 on December 24.  Slow improvement with treatment of infection and sepsis and on January 13, 2017 BUN 29 and creatinine 3.0. He developed mild respiratory distress at rest and required oxygen supplementation.  Chest radiograph on January 3 with borderline cardiomegaly, increased pulmonary vascular congestion compared with December 29, chronic interstitial prominence of the lungs with superimposed basilar airspace opacities left base greater than right and a small right pleural effusion.  There was no clinical suspicion that he developed a recurrent infection.  He was afebrile.  White count 8800.  He was treated with as needed parenteral diuretics with good diuretic response.  He will be discharged on a daily diuretic until follow-up.  Disposition: Condition stable enough for discharge to a skilled nursing facility Continue supplemental oxygen.  As needed diuretics. Clinical status discussed with 1 of his daughters who is also a Biomedical engineersocial worker Medical follow-up in our general medical clinic.  He will also follow-up with nephrology. Complications: Symptomatic bradycardia with near syncope requiring temporary intensive care unit management

## 2017-01-13 NOTE — Progress Notes (Addendum)
4:45pm-CSW received call from daughter, Babette Relicammy. She is accepting of patient's discharge plan [facility] and apologized for the confusion.    4:21pm-CSW received call from patient's daughter, April asking CSW who [facility] SN[facility]. CSW explained that the CSW on that floor has left for the day and CSW would have to contact her on her personal cell. She stated that she hadn't gotten to tour facilities yet. CSW explained that insurance is closed on the weekend and we are unable to change facilities at this point. CSW contacted floor CSW. She stated that patient is alert and oriented and provided permission for her to speak with both of his daughters about SNF choice and his other daughter, Babette Relicammy, made the decision to go [facility]. CSW updated April. [facility]. CSW explained to April that if patient does not like the SNF once he gets there, they are able to find a new facility and request new authorization once [employer] opens again.   Osborne Cascoadia Berlin Mokry LCSWA [phone]

## 2017-01-13 NOTE — Progress Notes (Signed)
Subjective:  Patient wants to go home today. He is having trouble breathing this AM and states that he feels like he isn't getting enough air. Otherwise no complaints.  Objective:  Vital signs in last 24 hours: Vitals:   01/12/17 1841 01/12/17 2152 01/13/17 0447 01/13/17 0657  BP:  (!) 128/43 (!) 127/41   Pulse:  79 84   Resp:  16 16   Temp:  100 F (37.8 C) (!) 100.6 F (38.1 C) 98.8 F (37.1 C)  TempSrc:  Oral Oral Oral  SpO2:  94% 97%   Weight: 150 lb 3.2 oz (68.1 kg)     Height:       Physical Exam  Constitutional:  Elderly, sick appearing gentleman laying in bed with 4L [place] in place breathing rapidly but in no acute distress  Cardiovascular: Normal rate, regular rhythm and intact distal pulses.  No murmur heard. Respiratory:  Poor effort. Increased respiratory rate without evidence of nasal flaring or accessory muscle use. Decreased breath sounds on right base with intermittent crackles throughout bilateral lung fields, although exam limited 2/2 patient effort. No wheezing appreciated.  Musculoskeletal: He exhibits no edema (of bilateral lower extremities) or tenderness (of bilateral lower extremities).  Skin: Skin is warm and dry. No rash noted. No erythema.   Assessment/Plan:  Ross Bauer is a 72 y.o. male with history ofCOPD (no PFTs),CKD IV,chronic anemia,T2DM, HTN, HLD, and BPH who presented with altered mental status and found to have multifocal PNA,enterobacter UTI, and nonketotic hyperosmolar hyperglycemia.Transfer to ICU 12/27 after respiratory arrest.   Acute on chronic anemia:Suspect multifactorial in origin and 2/2 iron deficiency in setting of CKD. Hgb stable around 8-9. Previous hemoccult +, patient s/p EGD with ablation of small, non bleeding angioectasia. No other abnormalities noted on EGD. Gastrology signed off. -Nephrology consulted, will have outpatient follow up -Continue Aranesp started 12/30 -Continue PO protonix 40 mg QD  -Iron 325  with breakfast -Transfusion goal Hgb 7  Worsening SOB in setting of COPD, Hx of tobacco abuse:Patient got dose of lasix 80 mg yesterday. Continues to be tachypneic with abnormal lung exam. Patient's chest X ray showed R pleural effusion with increased vascular congestion. Patient not getting daily weights while inpatient, but POC wt yesterday showed patient up 15 lbs since 12/26. Radiologist suggests possible pneumonia in R lung, however clinically patient afebrile without leukocytosis on labs. No cough. More likely that patient's recent increased oxygen requirement and tachypnea a result of volume overload given physical exam, weight changes, and radiographic findings. -PO lasix 80 mg  -Obtain BNP  -Scheduled DuoNebs for COPD treatment -Continue supplemental oxygen, wean as tolerated currently on 4L during exam  Acute on CKD IV: Baseline Cr ~3 and Cr 6 on presentation though to be secondary dehydration in the setting of poor PO intake x1-2 weeks prior to presentation. Cr currently 2.97 (baseline). -Nephrology consulted, appreciate recommendations -Bicarb started 12/31  -Avoid nephrotoxic medications, daily BMP  Dysphagia: Swallow evaluation performed 12/25 and patient noted to cough and possibly aspirate during ingestion of liquids. Evaluated by speech for dysphagia. EGD without structural abnormalities that would explain this symptom. Patient thinks it is 2/2 absence of teeth. -Continue Dysphagia 3 diet   T2DM: A1c 11.3 12/24 from 6.4 3 months ago. Presented with nonketotic hyperosmolar hyperglycemia requiring insulin gtt. Now on SQ insulin with appropriate CBG -Lantus 15U QHS  -M-SSI + CBG monitoring   HTN: Home regimen includes amlodipine 10 mg, Coreg 25 mg BID, and clonidine 0.1 BID at  home. Currently normotensive on amlodipine 5 mg daily while inpatient.  -Continue amlodipine 5 mg   HLD:  -Continue home Lipitor 80 mg QD  FEN/GI: -Dysphagia diet -Protein supplements per  nutrition -No IVF, replace electrolytes as needed  VTE Prophylaxis: SCD, ambulate as tolerated Code Status: Full  Dispo: Anticipated discharge in approximately 1-2 days pending SNF placement.  Rozann Lesches, MD 01/13/2017, 6:59 AM

## 2017-01-13 NOTE — Progress Notes (Signed)
Hypoglycemic Event  CBG: 65  Treatment: 15 GM carbohydrate snack  Symptoms: None  Follow-up CBG: Time: 0604 CBG Result:139  Possible Reasons for Event: Unknown  Comments/MD notified: MD notified via text page. No further orders given.    Ross Bauer

## 2017-01-14 ENCOUNTER — Encounter (HOSPITAL_COMMUNITY): Payer: Self-pay | Admitting: Internal Medicine

## 2017-01-14 DIAGNOSIS — J449 Chronic obstructive pulmonary disease, unspecified: Secondary | ICD-10-CM

## 2017-01-14 DIAGNOSIS — K08109 Complete loss of teeth, unspecified cause, unspecified class: Secondary | ICD-10-CM

## 2017-01-14 DIAGNOSIS — N400 Enlarged prostate without lower urinary tract symptoms: Secondary | ICD-10-CM

## 2017-01-14 LAB — CBC
HCT: 25 % — ABNORMAL LOW (ref 39.0–52.0)
Hemoglobin: 7.8 g/dL — ABNORMAL LOW (ref 13.0–17.0)
MCH: 28.2 pg (ref 26.0–34.0)
MCHC: 31.2 g/dL (ref 30.0–36.0)
MCV: 90.3 fL (ref 78.0–100.0)
PLATELETS: 296 10*3/uL (ref 150–400)
RBC: 2.77 MIL/uL — ABNORMAL LOW (ref 4.22–5.81)
RDW: 16.6 % — ABNORMAL HIGH (ref 11.5–15.5)
WBC: 8.1 10*3/uL (ref 4.0–10.5)

## 2017-01-14 LAB — BASIC METABOLIC PANEL
Anion gap: 7 (ref 5–15)
BUN: 31 mg/dL — AB (ref 6–20)
CALCIUM: 7.2 mg/dL — AB (ref 8.9–10.3)
CO2: 24 mmol/L (ref 22–32)
CREATININE: 2.79 mg/dL — AB (ref 0.61–1.24)
Chloride: 101 mmol/L (ref 101–111)
GFR calc Af Amer: 24 mL/min — ABNORMAL LOW (ref >60)
GFR, EST NON AFRICAN AMERICAN: 21 mL/min — AB (ref >60)
Glucose, Bld: 84 mg/dL (ref 65–99)
Potassium: 3.7 mmol/L (ref 3.5–5.1)
Sodium: 132 mmol/L — ABNORMAL LOW (ref 135–145)

## 2017-01-14 LAB — GLUCOSE, CAPILLARY
GLUCOSE-CAPILLARY: 89 mg/dL (ref 65–99)
GLUCOSE-CAPILLARY: 77 mg/dL (ref 65–99)
Glucose-Capillary: 144 mg/dL — ABNORMAL HIGH (ref 65–99)
Glucose-Capillary: 156 mg/dL — ABNORMAL HIGH (ref 65–99)
Glucose-Capillary: 180 mg/dL — ABNORMAL HIGH (ref 65–99)
Glucose-Capillary: 259 mg/dL — ABNORMAL HIGH (ref 65–99)
Glucose-Capillary: 76 mg/dL (ref 65–99)

## 2017-01-14 MED ORDER — DARBEPOETIN ALFA 200 MCG/0.4ML IJ SOSY: 200.0000 ug | PREFILLED_SYRINGE | INTRAMUSCULAR | 0 refills | Status: AC

## 2017-01-14 MED ORDER — IPRATROPIUM BROMIDE HFA 17 MCG/ACT IN AERS: 1.0000 | INHALATION_SPRAY | RESPIRATORY_TRACT | 2 refills | Status: DC | PRN

## 2017-01-14 MED ORDER — FERROUS SULFATE 325 (65 FE) MG PO TABS: 325.0000 mg | ORAL_TABLET | Freq: Every day | ORAL | 0 refills | Status: AC

## 2017-01-14 MED ORDER — ALBUTEROL SULFATE HFA 108 (90 BASE) MCG/ACT IN AERS: 2.0000 | INHALATION_SPRAY | Freq: Four times a day (QID) | RESPIRATORY_TRACT | 2 refills | Status: AC | PRN

## 2017-01-14 MED ORDER — INSULIN GLARGINE 100 UNIT/ML ~~LOC~~ SOLN: 15.0000 [IU] | Freq: Every day | SUBCUTANEOUS | 11 refills | Status: DC

## 2017-01-14 MED ORDER — INSULIN ASPART 100 UNIT/ML ~~LOC~~ SOLN: 0.0000 [IU] | SUBCUTANEOUS | 11 refills | Status: DC

## 2017-01-14 NOTE — Progress Notes (Signed)
Internal Medicine Attending:   I saw and examined the patient. I reviewed the resident's note and I agree with the resident's findings and plan as documented in the resident's note. Patient sitting up in chair- no distress was on 4L of supplemental o2 on entering room.  We stopped O2 for duration of our visit, at end of visit SpO2 was 89-91%.  We continue to work on weaning off O2, likely currently needs supplemental only with ambulation. Additionally his Luna Fusegranson is present this is daughter April's son.  He reports his mother April does not approve of d/c to SNF and that she is the oldest child and his primary caretaker in the area, they would like to consider other options.  We will notify our Social worker of this as I believe they have been mostly coordinating with other daughter.  Patient himself today with grandson in room reports he does not [facility], is willing for other SNF discharge and voices he wants April to make the decision.

## 2017-01-14 NOTE — Progress Notes (Signed)
Subjective:  Patient sitting in bedside chair comfortably eating breakfast in no acute distress. Grandson present bedside. Patient and grandson have many questions regarding patient's plan of care and discharge, these were answered to their satisfaction bedside. The patient states he is ready to go to SNF but only wants to go to one approved by daughter Ross Bauer. Even though he is ready to leave hospital, he continues to endorse difficulty breathing with exertion.   Objective:  Vital signs in last 24 hours: Vitals:   01/13/17 0941 01/13/17 2300 01/14/17 0145 01/14/17 0441  BP: 137/79 (!) 199/77 (!) 142/75 (!) 157/55  Pulse: (!) 113 (!) 110 (!) 105 90  Resp:  16  16  Temp: 98 F (36.7 C) 100.2 F (37.9 C) 99.5 F (37.5 C) 98.9 F (37.2 C)  TempSrc: Axillary Oral  Oral  SpO2: 96% 100%  96%  Weight:    140 lb 6.9 oz (63.7 kg)  Height:       Physical Exam  Constitutional:  Elderly, sick appearing gentleman sitting in bedside chair eating breakfast in no acute distress. Patient originally on 4L [place] but weaned to room air during interview.  Cardiovascular: Normal rate, regular rhythm and intact distal pulses.  No murmur heard. Respiratory:  Better effort with exam. Patient breathing more comfortably on room air today, with less tachypnea when oxygen weaned. Speaking in full sentences.  No accessory muscle use or nasal flaring. Interval improvement in his crackles, with minimal crackles appreciated at lung bases.  Musculoskeletal: He exhibits no edema (of bilateral lower extremities) or tenderness (of bilateral lower extremities).  Skin: Skin is warm and dry. No rash noted. No erythema.   Assessment/Plan:  Ross Bauer is a 72 y.o. male with history ofCOPD (no PFTs),CKD IV,chronic anemia,T2DM, HTN, HLD, and BPH who presented with altered mental status and found to have multifocal PNA,enterobacter UTI, and nonketotic hyperosmolar hyperglycemia.Transfer to ICU 12/27 after respiratory  arrest.   Acute on chronic anemia:Suspect multifactorial in origin and 2/2 iron deficiency in setting of CKD. Hgb stable around 8 (7.8 today). No obvious signs of bleeding on exam. Previous hemoccult + while in ICU, patient s/p EGD with ablation of small, non bleeding angioectasia. No other abnormalities noted on EGD. Gastrology signed off. -Nephrology consulted, will have outpatient follow up -Continue Aranesp started 12/30 -Continue PO protonix 40 mg QD  -Iron 325 with breakfast -Transfusion goal Hgb 7  Worsening SOB in setting of COPD, Hx of tobacco abuse:Patient breathing more comfortably on exam today. Diuresed appropriately over past 24 hours with PO lasix 80 mg. BNP within normal limits. Suspect changes observed in oxygen requirement and breathing 2/2 volume overload given clinical improvement with diuresis. Not on oxygen at home and will continue to wean supplemental oxygen prior to discharge.  -Patient saturating 89-90% on room air during interview -Check pulse ox with ambulation -Will consider additional dose of PO lasix 80 mg if patient desaturates with amublation -Continue Duonebs for treatment of COPD  Acute on CKD IV: Baseline Cr ~3 and Cr 6 on presentation though to be secondary dehydration in the setting of poor PO intake x1-2 weeks prior to presentation. Cr currently 2.72. -Nephrology consulted, appreciate recommendations  Dysphagia: Swallow evaluation performed 12/25 and patient noted to cough and possibly aspirate during ingestion of liquids. Evaluated by speech for dysphagia. EGD without structural abnormalities that would explain this symptom. Patient thinks it is 2/2 absence of teeth. -Continue Dysphagia 3 diet   T2DM: A1c 11.3 12/24 from 6.4  3 months ago. Presented with nonketotic hyperosmolar hyperglycemia requiring insulin gtt. Now on SQ insulin with appropriate CBGs -Lantus 15U QHS  -M-SSI + CBG monitoring   HTN: Home regimen includes amlodipine 10 mg, Coreg 25  mg BID, and clonidine 0.1 BID at home. Currently normotensive on amlodipine 5 mg daily while inpatient.  -Continue amlodipine 5 mg   HLD:  -Continue home Lipitor 80 mg QD  FEN/GI: -Dysphagia diet -Protein supplements per nutrition -No IVF, replace electrolytes as needed  VTE Prophylaxis: SCD, ambulate as tolerated Code Status: Full  Dispo: Anticipated discharge today pending SNF placement.  Rozann LeschesNedrud, Ross Kunkler, MD 01/14/2017, 10:00 AM

## 2017-01-14 NOTE — Progress Notes (Addendum)
Paged to patient's room by SW prior to discharge to discuss medications and discharge plan.  Patient's daughter Helmut Musterlicia informed of all medication changes that were made during hospitalization and what prescriptions will be sent with patient once discharged to facility. Daughter appreciated assistance from physician regarding medication changes.  Daughter convinced that patient was very confused prior to coming to hospital and didn't know which medications to take on a day to day basis. Gave patient's daughter information to contact internal medicine clinic once patient discharged from hospital. Suggested she touch base with clinic pharmacist to find out if patient would benefit from med assist program or if patient would benefit from additional clinic resources that could help with daily medications (such as pill packs, etc).   Patient reluctant for [facility]. It appears that patient agreed to go to facility at request of his daughter Babette Relicammy, who lives out of state. Patient wants to go to SNF and states he thought he told Tammy to send him to facility close [facility] his [facility] ([facility]). I think [facility]my thought [facility] the [facility] close to patient's house, as she is not familiar with geographic location because she lives in [place]. I think Tammy gave SW permission to pursue [facility] on assumption that it was close to patient's house? I'm not sure where miscommunication occurred or how it occurred, but recommended that patient's daughter Helmut Musterlicia [facility] to [facility]'s choice of [facility]. SW to follow.

## 2017-01-15 LAB — BASIC METABOLIC PANEL
Anion gap: 8 (ref 5–15)
BUN: 37 mg/dL — AB (ref 6–20)
CALCIUM: 7 mg/dL — AB (ref 8.9–10.3)
CO2: 23 mmol/L (ref 22–32)
Chloride: 99 mmol/L — ABNORMAL LOW (ref 101–111)
Creatinine, Ser: 2.77 mg/dL — ABNORMAL HIGH (ref 0.61–1.24)
GFR calc Af Amer: 25 mL/min — ABNORMAL LOW (ref >60)
GFR, EST NON AFRICAN AMERICAN: 21 mL/min — AB (ref >60)
GLUCOSE: 108 mg/dL — AB (ref 65–99)
Potassium: 3.6 mmol/L (ref 3.5–5.1)
Sodium: 130 mmol/L — ABNORMAL LOW (ref 135–145)

## 2017-01-15 LAB — CBC
HCT: 26.5 % — ABNORMAL LOW (ref 39.0–52.0)
Hemoglobin: 8.4 g/dL — ABNORMAL LOW (ref 13.0–17.0)
MCH: 29.1 pg (ref 26.0–34.0)
MCHC: 31.7 g/dL (ref 30.0–36.0)
MCV: 91.7 fL (ref 78.0–100.0)
PLATELETS: 280 10*3/uL (ref 150–400)
RBC: 2.89 MIL/uL — ABNORMAL LOW (ref 4.22–5.81)
RDW: 16.8 % — AB (ref 11.5–15.5)
WBC: 8.4 10*3/uL (ref 4.0–10.5)

## 2017-01-15 LAB — GLUCOSE, CAPILLARY
GLUCOSE-CAPILLARY: 129 mg/dL — AB (ref 65–99)
GLUCOSE-CAPILLARY: 268 mg/dL — AB (ref 65–99)
GLUCOSE-CAPILLARY: 143 mg/dL — AB (ref 65–99)
Glucose-Capillary: 101 mg/dL — ABNORMAL HIGH (ref 65–99)
Glucose-Capillary: 114 mg/dL — ABNORMAL HIGH (ref 65–99)

## 2017-01-15 NOTE — Clinical Social Work Note (Addendum)
CSW spoke with patient's daughter April who confirmed they would rather have patient go [facility] for [facility] rehab instead of [facility].  CSW spoke to [facility] and they are going to call CSW back to see if they can accept patient today.  CSW spoke to [facility], who are aware that patient and his family would like him to go [facility] instead and will not take patient now.  Weekday CSW to follow up with SNF on Monday.  2:20pm  CSW attempted to contact [facility] admissions worker, left a message on voicemail.  CSW to continue to follow patient's progress throughout discharge planning.  2:38pm  CSW received phone call back from [facility], they will have to contact patient's insurance company tomorrow to have authorization switched to [facility].  5:30pm  CSW spoke to patient's daughter April and updated her that [facility], will try to get insurance authorization switched over to them on Monday.  Ervin KnackEric R. Aleka Twitty, MSW, Theresia MajorsLCSWA [phone]  01/15/2017 10:43 AM

## 2017-01-15 NOTE — Progress Notes (Signed)
Called to assess pt for desaturation, SOB. Pt with obvious SOB upon RT arrival. SpO2 84% on 3LNC. Clear BBS. Neb tx given, Pt states little improvement with treatment. RN notified. RT will continue to monitor.

## 2017-01-15 NOTE — Progress Notes (Signed)
   Subjective: [name] says he is feeling fine today, he does not have any complaints. He is not having any difficulty breathing or chest pain. He expresses his frustration with the SNF placement process, he feels frustrated with our current president. He says that he would [facility] to [facility] for [facility].   Objective:  Vital signs in last 24 hours: Vitals:   01/15/17 0602 01/15/17 1100 01/15/17 1450 01/15/17 1454  BP: (!) 105/58  129/61   Pulse: 73  (!) 101   Resp: 16     Temp: 98 F (36.7 C)  98.1 F (36.7 C)   TempSrc: Oral  Oral   SpO2: 95% 92% (!) 86% 91%  Weight: 133 lb 9.6 oz (60.6 kg)     Height:       General: resting comfortably, watching church service on the television  HEENT: inner lower eyelid slightly exposed  Cardiac: RRR, no murmur appreciated, no peripheral edema  Lungs: no increased work of breathing, speaking in full sentences, on 4L [place], lungs clear to auscultation bilateral, decreased air movement.   Assessment/Plan:  Ross Bauer is a 72 y.o. male with history ofCOPD (no PFTs),CKD IV,chronic anemia,T2DM, HTN, HLD, and BPH who presented with altered mental status and found to have multifocal PNA,enterobacter UTI, and nonketotic hyperosmolar hyperglycemia.Transfer to ICU 12/27 after respiratory arrest.     Anemia in chronic kidney disease - hemoglobin stable/ starting to improve, possibly as a result of the aranesp  - no active bleeding, asymptomatic  - transfusion goal Hgb 7  - continue aranesp and iron   - continue protonix   Shortness of breath - weaned from O2 yesterday, SpO2 remained 92% with ambulation  - use supplemental O2 only for SpO2 < 88%, this is in an effort to preserve respiratory drive in this patient with known COPD   Acute on CKD IV  - normokalemic, BUN 37, normotensive, not volume overloaded  - Nephrology will follow up outpatient   Dysphagia  - continue dysphagia 3 diet  - continue to monitor    T2DM  - glucose trending 100-150 with single reading 268, will continue to monitor closely - continue lantus 15U qHS, ISS-M and CBGs  HTN  - low- normotensive overnight  - continue amlodipine 5 mg qd  HLD  - continue lipitor 80 mg qd   Dispo: Anticipated discharge in approximately tomorrow.   Ross Bauer, Ross Delgrande, MD 01/15/2017, 3:02 PM Pager: [phone]

## 2017-01-16 DIAGNOSIS — E1365 Other specified diabetes mellitus with hyperglycemia: Secondary | ICD-10-CM | POA: Diagnosis not present

## 2017-01-16 DIAGNOSIS — R531 Weakness: Secondary | ICD-10-CM | POA: Diagnosis not present

## 2017-01-16 DIAGNOSIS — E1100 Type 2 diabetes mellitus with hyperosmolarity without nonketotic hyperglycemic-hyperosmolar coma (NKHHC): Secondary | ICD-10-CM | POA: Diagnosis not present

## 2017-01-16 DIAGNOSIS — E875 Hyperkalemia: Secondary | ICD-10-CM | POA: Diagnosis not present

## 2017-01-16 DIAGNOSIS — N401 Enlarged prostate with lower urinary tract symptoms: Secondary | ICD-10-CM | POA: Diagnosis not present

## 2017-01-16 DIAGNOSIS — J3489 Other specified disorders of nose and nasal sinuses: Secondary | ICD-10-CM | POA: Diagnosis not present

## 2017-01-16 DIAGNOSIS — R195 Other fecal abnormalities: Secondary | ICD-10-CM | POA: Diagnosis not present

## 2017-01-16 DIAGNOSIS — R1312 Dysphagia, oropharyngeal phase: Secondary | ICD-10-CM | POA: Diagnosis not present

## 2017-01-16 DIAGNOSIS — K31819 Angiodysplasia of stomach and duodenum without bleeding: Secondary | ICD-10-CM | POA: Diagnosis not present

## 2017-01-16 DIAGNOSIS — I10 Essential (primary) hypertension: Secondary | ICD-10-CM | POA: Diagnosis not present

## 2017-01-16 DIAGNOSIS — E11311 Type 2 diabetes mellitus with unspecified diabetic retinopathy with macular edema: Secondary | ICD-10-CM | POA: Diagnosis not present

## 2017-01-16 DIAGNOSIS — R001 Bradycardia, unspecified: Secondary | ICD-10-CM | POA: Diagnosis not present

## 2017-01-16 DIAGNOSIS — R2243 Localized swelling, mass and lump, lower limb, bilateral: Secondary | ICD-10-CM | POA: Diagnosis not present

## 2017-01-16 DIAGNOSIS — R040 Epistaxis: Secondary | ICD-10-CM | POA: Diagnosis not present

## 2017-01-16 DIAGNOSIS — M6281 Muscle weakness (generalized): Secondary | ICD-10-CM | POA: Diagnosis not present

## 2017-01-16 DIAGNOSIS — M545 Low back pain: Secondary | ICD-10-CM | POA: Diagnosis not present

## 2017-01-16 DIAGNOSIS — J449 Chronic obstructive pulmonary disease, unspecified: Secondary | ICD-10-CM | POA: Diagnosis not present

## 2017-01-16 DIAGNOSIS — E785 Hyperlipidemia, unspecified: Secondary | ICD-10-CM | POA: Diagnosis not present

## 2017-01-16 DIAGNOSIS — E08649 Diabetes mellitus due to underlying condition with hypoglycemia without coma: Secondary | ICD-10-CM | POA: Diagnosis not present

## 2017-01-16 DIAGNOSIS — N289 Disorder of kidney and ureter, unspecified: Secondary | ICD-10-CM | POA: Diagnosis not present

## 2017-01-16 DIAGNOSIS — R945 Abnormal results of liver function studies: Secondary | ICD-10-CM | POA: Diagnosis not present

## 2017-01-16 DIAGNOSIS — L89323 Pressure ulcer of left buttock, stage 3: Secondary | ICD-10-CM | POA: Diagnosis not present

## 2017-01-16 DIAGNOSIS — J189 Pneumonia, unspecified organism: Secondary | ICD-10-CM | POA: Diagnosis not present

## 2017-01-16 DIAGNOSIS — R41841 Cognitive communication deficit: Secondary | ICD-10-CM | POA: Diagnosis not present

## 2017-01-16 DIAGNOSIS — D631 Anemia in chronic kidney disease: Secondary | ICD-10-CM | POA: Diagnosis not present

## 2017-01-16 DIAGNOSIS — S0992XA Unspecified injury of nose, initial encounter: Secondary | ICD-10-CM | POA: Diagnosis not present

## 2017-01-16 DIAGNOSIS — L89320 Pressure ulcer of left buttock, unstageable: Secondary | ICD-10-CM | POA: Diagnosis not present

## 2017-01-16 DIAGNOSIS — R5382 Chronic fatigue, unspecified: Secondary | ICD-10-CM | POA: Diagnosis not present

## 2017-01-16 DIAGNOSIS — R739 Hyperglycemia, unspecified: Secondary | ICD-10-CM | POA: Diagnosis not present

## 2017-01-16 DIAGNOSIS — R410 Disorientation, unspecified: Secondary | ICD-10-CM | POA: Diagnosis not present

## 2017-01-16 DIAGNOSIS — Z9181 History of falling: Secondary | ICD-10-CM | POA: Diagnosis not present

## 2017-01-16 DIAGNOSIS — R309 Painful micturition, unspecified: Secondary | ICD-10-CM | POA: Diagnosis not present

## 2017-01-16 DIAGNOSIS — R7309 Other abnormal glucose: Secondary | ICD-10-CM | POA: Diagnosis not present

## 2017-01-16 DIAGNOSIS — R0602 Shortness of breath: Secondary | ICD-10-CM | POA: Diagnosis not present

## 2017-01-16 DIAGNOSIS — N184 Chronic kidney disease, stage 4 (severe): Secondary | ICD-10-CM | POA: Diagnosis not present

## 2017-01-16 DIAGNOSIS — J9601 Acute respiratory failure with hypoxia: Secondary | ICD-10-CM | POA: Diagnosis not present

## 2017-01-16 DIAGNOSIS — R8290 Unspecified abnormal findings in urine: Secondary | ICD-10-CM | POA: Diagnosis not present

## 2017-01-16 DIAGNOSIS — K31811 Angiodysplasia of stomach and duodenum with bleeding: Secondary | ICD-10-CM | POA: Diagnosis not present

## 2017-01-16 DIAGNOSIS — E119 Type 2 diabetes mellitus without complications: Secondary | ICD-10-CM | POA: Diagnosis not present

## 2017-01-16 LAB — BASIC METABOLIC PANEL
Anion gap: 9 (ref 5–15)
BUN: 41 mg/dL — AB (ref 6–20)
CO2: 25 mmol/L (ref 22–32)
Calcium: 6.9 mg/dL — ABNORMAL LOW (ref 8.9–10.3)
Chloride: 97 mmol/L — ABNORMAL LOW (ref 101–111)
Creatinine, Ser: 2.71 mg/dL — ABNORMAL HIGH (ref 0.61–1.24)
GFR calc non Af Amer: 22 mL/min — ABNORMAL LOW (ref >60)
GFR, EST AFRICAN AMERICAN: 25 mL/min — AB (ref >60)
Glucose, Bld: 120 mg/dL — ABNORMAL HIGH (ref 65–99)
POTASSIUM: 3.4 mmol/L — AB (ref 3.5–5.1)
SODIUM: 131 mmol/L — AB (ref 135–145)

## 2017-01-16 LAB — CBC
HCT: 26.7 % — ABNORMAL LOW (ref 39.0–52.0)
HEMOGLOBIN: 8.3 g/dL — AB (ref 13.0–17.0)
MCH: 28.4 pg (ref 26.0–34.0)
MCHC: 31.1 g/dL (ref 30.0–36.0)
MCV: 91.4 fL (ref 78.0–100.0)
Platelets: 279 10*3/uL (ref 150–400)
RBC: 2.92 MIL/uL — ABNORMAL LOW (ref 4.22–5.81)
RDW: 16.7 % — ABNORMAL HIGH (ref 11.5–15.5)
WBC: 7.2 10*3/uL (ref 4.0–10.5)

## 2017-01-16 LAB — GLUCOSE, CAPILLARY
GLUCOSE-CAPILLARY: 208 mg/dL — AB (ref 65–99)
GLUCOSE-CAPILLARY: 230 mg/dL — AB (ref 65–99)
GLUCOSE-CAPILLARY: 62 mg/dL — AB (ref 65–99)
Glucose-Capillary: 143 mg/dL — ABNORMAL HIGH (ref 65–99)
Glucose-Capillary: 118 mg/dL — ABNORMAL HIGH (ref 65–99)
Glucose-Capillary: 58 mg/dL — ABNORMAL LOW (ref 65–99)
Glucose-Capillary: 89 mg/dL (ref 65–99)

## 2017-01-16 MED ORDER — FUROSEMIDE 40 MG PO TABS
40.0000 mg | ORAL_TABLET | Freq: Every day | ORAL | Status: DC
  Administered 2017-01-16: 40 mg via ORAL
  Filled 2017-01-16: qty 1

## 2017-01-16 NOTE — Clinical Social Work Placement (Signed)
   CLINICAL SOCIAL WORK PLACEMENT  NOTE [facility] Room 125B Date:  01/16/2017  Patient Details  Name: Ross Bauer MRN: [id number] Date of Birth: 09/10/1945  Clinical Social Work is seeking post-discharge placement for this patient at the Skilled  Nursing Facility level of care (*CSW will initial, date and re-position this form in  chart as items are completed):  Yes   Patient/family provided with [facility] Clinical Social Work Department's list of facilities offering this level of care within the geographic area requested by the patient (or if unable, by the patient's family).  Yes   Patient/family informed of their freedom to choose among providers that offer the needed level of care, that participate in Medicare, Medicaid or managed care program needed by the patient, have an available bed and are willing to accept the patient.  Yes   Patient/family informed of [facility]'s ownership interest in [facility] and [facility], as well as of the fact that they are under no obligation to receive care at these facilities.  PASRR submitted to EDS on       PASRR number received on       Existing PASRR number confirmed on       FL2 transmitted to all facilities in geographic area requested by pt/family on 01/05/17     FL2 transmitted to all facilities within larger geographic area on       Patient informed that his/her managed care company has contracts with or will negotiate with certain facilities, including the following:        Yes   Patient/family informed of bed offers received.  [facility]     Physician recommends and patient chooses bed at      [facility] 01[facility]16[facility].  Patient to be transferred to facility by [facility]     Patient family notified on 01/16/17 of transfer.  Name of family member notified:  April Hedtke, daughter     PHYSICIAN       Additional Comment:     _______________________________________________ Doy HutchingIsabel H Marco Adelson, LCSWA 01/16/2017, 3:29 PM

## 2017-01-16 NOTE — Progress Notes (Signed)
Subjective:  Patient sitting comfortably on side of bed this AM in no acute distress. States some intermittent shortness of breath with ambulation but no other acute complaints. Patient requests discharge to SNF today and inquires when this will happen. Told him will coordinate with SW today and hopeful for D/C later today.  Objective:  Vital signs in last 24 hours: Vitals:   01/15/17 2252 01/16/17 0029 01/16/17 0500 01/16/17 0504  BP:    (!) 125/46  Pulse: 99 95  93  Resp:      Temp:    99.2 F (37.3 C)  TempSrc:    Oral  SpO2: 90% 93%  92%  Weight:   149 lb 7.6 oz (67.8 kg)   Height:       Physical Exam  Constitutional:  Patient seen sitting comfortably on side of bed this AM eating breakfast with 1-2 L [place] in place.  HENT:  Mouth/Throat: Oropharynx is clear and moist.  Cardiovascular: Normal rate, regular rhythm and intact distal pulses.  No murmur heard. Respiratory:  Good effort with exam. Patient speaking in full sentences. NO nasal flaring or accessory muscle use. No crackles or wheezing appreciated.  Musculoskeletal: He exhibits no edema (of bilateral lower extremities) or tenderness (of bilateral lower extremities).  Skin: Skin is warm and dry. No rash noted. No erythema.  Psychiatric: He has a normal mood and affect. Thought content normal.   Assessment/Plan:  Ross Bauer is a 72 y.o. male with history ofCOPD (no PFTs),CKD IV,chronic anemia,T2DM, HTN, HLD, and BPH who presented with altered mental status and found to have multifocal PNA,enterobacter UTI, and nonketotic hyperosmolar hyperglycemia.Transfer to ICU 12/27 after respiratory arrest.   Acute on chronic anemia:Suspect multifactorial in origin and 2/2 iron deficiency in setting of CKD. Hgb stable around 8 (AM labs pending, 8.4 yesterday).  -Nephrology consulted, will have outpatient follow up -Continue Aranesp started 12/30 -Continue PO protonix 40 mg QD  -Iron 325 with breakfast -Transfusion  goal Hgb 7  Worsening SOB in setting of COPD, Hx of tobacco abuse:Patient previously stable on room air and maintaining O2 saturations >92% with ambulation. Patient's lungs clear to auscultation bilaterally, no signs of COPD exacerbation or volume overload on exam. Patient intermittently requires supplemental oxygen with ambulation per chart review but as long as O2 sats remain >90%, Patient OK for discharge. -Home lasix 40 mg daily -Continue Duonebs for treatment of COPD  Acute on CKD IV: Baseline Cr ~3 and Cr 6 on presentation though to be secondary dehydration in the setting of poor PO intake x1-2 weeks prior to presentation. Cr around 2.7-2.8 in past few days. -Nephrology consulted, will follow as outpatient   Dysphagia: Swallow evaluation performed 12/25 and patient noted to cough and possibly aspirate during ingestion of liquids. Patient thinks it is 2/2 absence of teeth. EGD without abnormalities. -Continue Dysphagia 3 diet   T2DM: A1c 11.3 12/24 from 6.4 3 months ago. Presented with nonketotic hyperosmolar hyperglycemia requiring insulin gtt. Now on SQ insulin with appropriate CBGs. -Lantus 15U QHS  -M-SSI + CBG monitoring   HTN: Home regimen includes amlodipine 10 mg, Coreg 25 mg BID, and clonidine 0.1 BID at home. Currently normotensive on amlodipine 5 mg daily while inpatient.  -Continue amlodipine 5 mg   HLD:  -Continue home Lipitor 80 mg QD  FEN/GI: -Dysphagia diet -Protein supplements per nutrition -No IVF, replace electrolytes as needed  VTE Prophylaxis: SCD, ambulate as tolerated Code Status: Full  Dispo: Anticipated discharge today pending SNF placement.  Rozann LeschesNedrud, Leif Loflin, MD 01/16/2017, 8:50 AM

## 2017-01-16 NOTE — Social Work (Addendum)
CSW acknowledging that pt has elected to [facility].  CSW awaiting insurance re-authorization due to change in SNF selection.   9:45am- CSW updated pt regarding insurance authorization and acknowledging new selection of SNF.  1:33pm- CSW spoke with pt daughter April, she is aware that insurance authorization is still pending. Pt daughter stated no further questions.  3:00pm- CSW has received notice that [facility] has received authorization. MD notified/pt daughter April notified.     CSW continuing to follow to support discharge [facility] when authorization has been received.   Doy HutchingIsabel H Gad Bauer, LCSWA [facility] Clinical Social Work [phone]

## 2017-01-16 NOTE — Progress Notes (Signed)
Medicine attending discharge note: I personally examined this patient on the day of discharge and I attest to the accuracy of the discharge evaluation and plan as recorded in the final progress note by resident physician Dr. [name].  72 year old man with hypertension, stage IV chronic renal insufficiency, type 2 diabetes on oral agent, obstructive airway disease, chronic anemia likely related to his chronic renal insufficiency, who developed a upper respiratory tract infection with progressive symptoms over a 2-week interval culminating with generalized weakness, decreased mobility, decreased oral intake, and confusion.  Family had him transported to the hospital for further evaluation.  Ross Bauer was mildly hypothermic.  Increased respiratory rate.  No wheezing or rhonchi but increased work of breathing.  No focal neurologic deficits and Ross Bauer was alert and oriented.  Chest radiograph read as multifocal pneumonia.  Laboratory studies showed marked hyperglycemia with blood sugar over 1000.  No anion gap acidosis.  Renal function worse than his baseline with BUN 106 and creatinine 6.  Potassium 6.6.  Hemoglobin down from baseline of 11.5 to 8.8.  Blood pressure low compared with his baseline.  Lactic acid 4.7. Ross Bauer was treated with fluids and antibiotics.  Insulin.  Condition initially stabilized.  On the third hospital day, Ross Bauer got up to go to the bathroom, Ross Bauer had a near syncopal episode, cardiac monitor showed severe bradycardia in the 30s.  A code was called.  Ross Bauer required brief CPR.  Ross Bauer had rapid recovery of his pulse and blood pressure.  Ross Bauer did not require intubation.  Laboratory studies showed further fall and his hemoglobin down to 5 g.  Ross Bauer was transferred to the intensive care unit.  Transfused.  Condition stabilized and Ross Bauer returned to the general medical ward.  Urine cultures grew Enterobacter. Upper endoscopy was done to exclude upper GI bleed as etiology of the sudden drop in hemoglobin.  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 Ross Bauer was seen by nephrology.  Aranesp erythrocyte stimulating factor started.  Renal function stabilized back to his previous baseline and on day of discharge January 7 BUN 41 with creatinine 2.7 and estimated GFR 25. Ross Bauer developed moderate respiratory distress 3 days prior to discharge.  Chest x-ray done on December 29 showed heart size within normal limits.  No significant change in diffuse interstitial and airspace disease.  Ross Bauer did have jugular venous distention but a BNP only mildly elevated at 148.  Ross Bauer did  appear clinically fluid overloaded.  Ross Bauer was given a few doses of parenteral diuretic with good diuresis.  Outpatient oral diuretic then resumed with furosemide 40 mg daily. Final medicine reconciliation pending but new medications will include Lantus insulin 15 units at bedtime.  Protonix 40 mg daily.  Sodium bicarbonate tablets 650 mg 3 times daily.  Aranesp every 3 weeks or per nephrology recommendation.  First dose given December 30.  Iron 325 mg daily.  Dysphagia 3 diet.  Oxygen 2-3 L as needed for respiratory distress.  Disposition: Condition stable enough for transfer to skilled nursing facility Follow-up in our general medical clinic Complications: Symptomatic bradycardia requiring temporary intensive care unit management

## 2017-01-16 NOTE — Progress Notes (Addendum)
AT 0347, pt's CBG=62.  Patient asymptomatic but requested something to eat.  Gave OJ to drink and micorwavable braided [place] to eat.  Will recheck CBG within an hour.   At 0503, pt's CBG=143.  Patient now resting on bed.

## 2017-01-16 NOTE — Social Work (Signed)
Clinical Social Worker facilitated patient discharge including contacting patient family and facility to confirm patient discharge plans.  Clinical information faxed to facility and family agreeable with plan.  CSW arranged [facility].  RN to call [phone] with report prior to discharge.  Clinical Social Worker will sign off for now as social work intervention is no longer needed. Please consult us again if new need arises.  Doy HutchingIsabel H Brittinie Wherley, LCSWA Clinical Social Worker

## 2017-01-16 NOTE — Progress Notes (Signed)
Physical Therapy Treatment Patient Details Name: Ross Bauer MRN: [id number] DOB: 02/26/1945 Today's Date: 01/16/2017    History of Present Illness 72 year old man with h/o HTN, anemia, hyperlipidemia, and DM admitted on December 24 with a 2-week history of upper respiratory symptoms, progressive generalized weakness, cough, and confusion.  He was hyperglycemic.  X-ray showed multifocal bilateral pulmonary infiltrates consistent with pneumonia.  He developed an episode of symptomatic bradycardia and an acute fall in his hemoglobin to 5 g from admission hemoglobin of 8.6 in the context of stage V renal insufficiency.    PT Comments    Patient able to ambulate 2575ft at a time with min A +2 for safety. Pt with SpO2 desat (see general comments below) and required up to 4L O2 via [place] to achieve SpO2 90% at rest. Pt with 2/4 DOE with mobility. Continue to progress as tolerated with anticipated d/c to SNF for further skilled PT services.     Follow Up Recommendations  SNF     Equipment Recommendations  Other (comment)(TBA)    Recommendations for Other Services       Precautions / Restrictions Precautions Precautions: Fall Precaution Comments: fell at home about 1-2 weeks PTA per daughter with vertigo episode    Mobility  Bed Mobility Overal bed mobility: Needs Assistance Bed Mobility: Supine to Sit;Sit to Supine     Supine to sit: Min guard Sit to supine: Min guard   General bed mobility comments: min guard for safety; increased time and effort  Transfers Overall transfer level: Needs assistance Equipment used: Rolling walker (2 wheeled) Transfers: Sit to/from Stand Sit to Stand: Min assist         General transfer comment: assist for balance upon standing; total A to pull up pants; cues for safe hand placement  Ambulation/Gait Ambulation/Gait assistance: +2 safety/equipment;Min assist(chair follow) Ambulation Distance (Feet): (5475ft X2) Assistive device: Rolling walker  (2 wheeled) Gait Pattern/deviations: Trunk flexed;Wide base of support;Decreased step length - right;Decreased step length - left;Decreased dorsiflexion - right;Decreased dorsiflexion - left;Step-through pattern Gait velocity: decreased   General Gait Details: cues for breathing technique, proximity to RW, and posture; seated rest break required and pt with 2/4 DOE   Information systems managertairs            Wheelchair Mobility    Modified Rankin (Stroke Patients Only)       Balance Overall balance assessment: Needs assistance Sitting-balance support: Feet supported;Bilateral upper extremity supported Sitting balance-Leahy Scale: Fair     Standing balance support: Bilateral upper extremity supported Standing balance-Leahy Scale: Poor                              Cognition Arousal/Alertness: Awake/alert Behavior During Therapy: Flat affect Overall Cognitive Status: No family/caregiver present to determine baseline cognitive functioning                                 General Comments: HOH      Exercises      General Comments General comments (skin integrity, edema, etc.): upon arrival pt supine and on 2L O2 via [place] with SpO2 79% and feeling SOB. O2 up to 4L during session with SpO2 at 85% while ambulating and up to 90% at rest      Pertinent Vitals/Pain Pain Assessment: Faces Faces Pain Scale: Hurts a little bit Pain Location: buttocks Pain Descriptors / Indicators: Sore;Discomfort Pain Intervention(s):  Monitored during session;Repositioned    Home Living                      Prior Function            PT Goals (current goals can now be found in the care plan section) Acute Rehab PT Goals PT Goal Formulation: With patient/family Time For Goal Achievement: 01/22/17 Potential to Achieve Goals: Good Progress towards PT goals: Progressing toward goals    Frequency    Min 3X/week      PT Plan Current plan remains appropriate     Co-evaluation              AM-PAC PT "6 Clicks" Daily Activity  Outcome Measure  Difficulty turning over in bed (including adjusting bedclothes, sheets and blankets)?: A Lot Difficulty moving from lying on back to sitting on the side of the bed? : Unable Difficulty sitting down on and standing up from a chair with arms (e.g., wheelchair, bedside commode, etc,.)?: Unable Help needed moving to and from a bed to chair (including a wheelchair)?: A Little Help needed walking in hospital room?: A Little Help needed climbing 3-5 steps with a railing? : A Lot 6 Click Score: 12    End of Session Equipment Utilized During Treatment: Gait belt Activity Tolerance: Patient tolerated treatment well Patient left: with call bell/phone within reach;in bed Nurse Communication: Mobility status;Other (comment)(SpO2 desat) PT Visit Diagnosis: Other abnormalities of gait and mobility (R26.89);Muscle weakness (generalized) (M62.81)     Time: 1610-9604 PT Time Calculation (min) (ACUTE ONLY): 35 min  Charges:  $Gait Training: 8-22 mins $Therapeutic Activity: 8-22 mins                    G Codes:       Erline Levine, PTA Pager: [phone]     Carolynne Edouard 01/16/2017, 3:32 PM

## 2017-01-17 DIAGNOSIS — R531 Weakness: Secondary | ICD-10-CM | POA: Diagnosis not present

## 2017-01-17 DIAGNOSIS — E11311 Type 2 diabetes mellitus with unspecified diabetic retinopathy with macular edema: Secondary | ICD-10-CM | POA: Diagnosis not present

## 2017-01-17 DIAGNOSIS — N184 Chronic kidney disease, stage 4 (severe): Secondary | ICD-10-CM | POA: Diagnosis not present

## 2017-01-17 DIAGNOSIS — I10 Essential (primary) hypertension: Secondary | ICD-10-CM | POA: Diagnosis not present

## 2017-01-19 DIAGNOSIS — R8290 Unspecified abnormal findings in urine: Secondary | ICD-10-CM | POA: Diagnosis not present

## 2017-01-19 DIAGNOSIS — R410 Disorientation, unspecified: Secondary | ICD-10-CM | POA: Diagnosis not present

## 2017-01-19 DIAGNOSIS — L89320 Pressure ulcer of left buttock, unstageable: Secondary | ICD-10-CM | POA: Diagnosis not present

## 2017-01-19 DIAGNOSIS — R309 Painful micturition, unspecified: Secondary | ICD-10-CM | POA: Diagnosis not present

## 2017-01-19 DIAGNOSIS — M6281 Muscle weakness (generalized): Secondary | ICD-10-CM | POA: Diagnosis not present

## 2017-01-23 DIAGNOSIS — J3489 Other specified disorders of nose and nasal sinuses: Secondary | ICD-10-CM | POA: Diagnosis not present

## 2017-01-23 DIAGNOSIS — S0992XA Unspecified injury of nose, initial encounter: Secondary | ICD-10-CM | POA: Diagnosis not present

## 2017-01-23 DIAGNOSIS — R040 Epistaxis: Secondary | ICD-10-CM | POA: Diagnosis not present

## 2017-01-26 ENCOUNTER — Ambulatory Visit: Payer: Medicare HMO | Admitting: Nurse Practitioner

## 2017-01-30 DIAGNOSIS — M6281 Muscle weakness (generalized): Secondary | ICD-10-CM | POA: Diagnosis not present

## 2017-01-30 DIAGNOSIS — R945 Abnormal results of liver function studies: Secondary | ICD-10-CM | POA: Diagnosis not present

## 2017-01-31 DIAGNOSIS — E08649 Diabetes mellitus due to underlying condition with hypoglycemia without coma: Secondary | ICD-10-CM | POA: Diagnosis not present

## 2017-01-31 DIAGNOSIS — R5382 Chronic fatigue, unspecified: Secondary | ICD-10-CM | POA: Diagnosis not present

## 2017-01-31 DIAGNOSIS — E119 Type 2 diabetes mellitus without complications: Secondary | ICD-10-CM | POA: Diagnosis not present

## 2017-02-02 DIAGNOSIS — R2243 Localized swelling, mass and lump, lower limb, bilateral: Secondary | ICD-10-CM | POA: Diagnosis not present

## 2017-02-02 DIAGNOSIS — M6281 Muscle weakness (generalized): Secondary | ICD-10-CM | POA: Diagnosis not present

## 2017-02-02 DIAGNOSIS — E119 Type 2 diabetes mellitus without complications: Secondary | ICD-10-CM | POA: Diagnosis not present

## 2017-02-02 DIAGNOSIS — R7309 Other abnormal glucose: Secondary | ICD-10-CM | POA: Diagnosis not present

## 2017-02-03 DIAGNOSIS — E785 Hyperlipidemia, unspecified: Secondary | ICD-10-CM | POA: Diagnosis not present

## 2017-02-03 DIAGNOSIS — J9601 Acute respiratory failure with hypoxia: Secondary | ICD-10-CM | POA: Diagnosis not present

## 2017-02-03 DIAGNOSIS — M6281 Muscle weakness (generalized): Secondary | ICD-10-CM | POA: Diagnosis not present

## 2017-02-03 DIAGNOSIS — I10 Essential (primary) hypertension: Secondary | ICD-10-CM | POA: Diagnosis not present

## 2017-02-03 DIAGNOSIS — R41841 Cognitive communication deficit: Secondary | ICD-10-CM | POA: Diagnosis not present

## 2017-02-03 DIAGNOSIS — N401 Enlarged prostate with lower urinary tract symptoms: Secondary | ICD-10-CM | POA: Diagnosis not present

## 2017-02-03 DIAGNOSIS — D631 Anemia in chronic kidney disease: Secondary | ICD-10-CM | POA: Diagnosis not present

## 2017-02-03 DIAGNOSIS — E1100 Type 2 diabetes mellitus with hyperosmolarity without nonketotic hyperglycemic-hyperosmolar coma (NKHHC): Secondary | ICD-10-CM | POA: Diagnosis not present

## 2017-02-06 DIAGNOSIS — M6281 Muscle weakness (generalized): Secondary | ICD-10-CM | POA: Diagnosis not present

## 2017-02-06 DIAGNOSIS — Z9181 History of falling: Secondary | ICD-10-CM | POA: Diagnosis not present

## 2017-02-06 DIAGNOSIS — M545 Low back pain: Secondary | ICD-10-CM | POA: Diagnosis not present

## 2017-02-06 DIAGNOSIS — J449 Chronic obstructive pulmonary disease, unspecified: Secondary | ICD-10-CM | POA: Diagnosis not present

## 2017-02-06 DIAGNOSIS — J189 Pneumonia, unspecified organism: Secondary | ICD-10-CM | POA: Diagnosis not present

## 2017-02-06 DIAGNOSIS — R0602 Shortness of breath: Secondary | ICD-10-CM | POA: Diagnosis not present

## 2017-02-07 DIAGNOSIS — N400 Enlarged prostate without lower urinary tract symptoms: Secondary | ICD-10-CM | POA: Diagnosis not present

## 2017-02-07 DIAGNOSIS — E785 Hyperlipidemia, unspecified: Secondary | ICD-10-CM | POA: Diagnosis not present

## 2017-02-07 DIAGNOSIS — E1122 Type 2 diabetes mellitus with diabetic chronic kidney disease: Secondary | ICD-10-CM | POA: Diagnosis not present

## 2017-02-07 DIAGNOSIS — N184 Chronic kidney disease, stage 4 (severe): Secondary | ICD-10-CM | POA: Diagnosis not present

## 2017-02-07 DIAGNOSIS — L89322 Pressure ulcer of left buttock, stage 2: Secondary | ICD-10-CM | POA: Diagnosis not present

## 2017-02-07 DIAGNOSIS — R1312 Dysphagia, oropharyngeal phase: Secondary | ICD-10-CM | POA: Diagnosis not present

## 2017-02-07 DIAGNOSIS — I129 Hypertensive chronic kidney disease with stage 1 through stage 4 chronic kidney disease, or unspecified chronic kidney disease: Secondary | ICD-10-CM | POA: Diagnosis not present

## 2017-02-07 DIAGNOSIS — D631 Anemia in chronic kidney disease: Secondary | ICD-10-CM | POA: Diagnosis not present

## 2017-02-07 DIAGNOSIS — Z8701 Personal history of pneumonia (recurrent): Secondary | ICD-10-CM | POA: Diagnosis not present

## 2017-02-08 DIAGNOSIS — E1122 Type 2 diabetes mellitus with diabetic chronic kidney disease: Secondary | ICD-10-CM | POA: Diagnosis not present

## 2017-02-08 DIAGNOSIS — L89322 Pressure ulcer of left buttock, stage 2: Secondary | ICD-10-CM | POA: Diagnosis not present

## 2017-02-08 DIAGNOSIS — N400 Enlarged prostate without lower urinary tract symptoms: Secondary | ICD-10-CM | POA: Diagnosis not present

## 2017-02-08 DIAGNOSIS — I129 Hypertensive chronic kidney disease with stage 1 through stage 4 chronic kidney disease, or unspecified chronic kidney disease: Secondary | ICD-10-CM | POA: Diagnosis not present

## 2017-02-08 DIAGNOSIS — N184 Chronic kidney disease, stage 4 (severe): Secondary | ICD-10-CM | POA: Diagnosis not present

## 2017-02-08 DIAGNOSIS — R1312 Dysphagia, oropharyngeal phase: Secondary | ICD-10-CM | POA: Diagnosis not present

## 2017-02-08 DIAGNOSIS — Z8701 Personal history of pneumonia (recurrent): Secondary | ICD-10-CM | POA: Diagnosis not present

## 2017-02-08 DIAGNOSIS — D631 Anemia in chronic kidney disease: Secondary | ICD-10-CM | POA: Diagnosis not present

## 2017-02-08 DIAGNOSIS — E785 Hyperlipidemia, unspecified: Secondary | ICD-10-CM | POA: Diagnosis not present

## 2017-02-10 ENCOUNTER — Emergency Department (HOSPITAL_COMMUNITY): Payer: Medicare HMO

## 2017-02-10 ENCOUNTER — Other Ambulatory Visit: Payer: Self-pay

## 2017-02-10 ENCOUNTER — Encounter (HOSPITAL_COMMUNITY): Payer: Self-pay | Admitting: Emergency Medicine

## 2017-02-10 ENCOUNTER — Telehealth: Payer: Self-pay | Admitting: Internal Medicine

## 2017-02-10 ENCOUNTER — Inpatient Hospital Stay (HOSPITAL_COMMUNITY)
Admission: EM | Admit: 2017-02-10 | Discharge: 2017-02-18 | DRG: 291 | Disposition: A | Discharge status: Home Health | Payer: Medicare HMO | Attending: Oncology | Admitting: Oncology

## 2017-02-10 DIAGNOSIS — I5033 Acute on chronic diastolic (congestive) heart failure: Secondary | ICD-10-CM

## 2017-02-10 DIAGNOSIS — Z9981 Dependence on supplemental oxygen: Secondary | ICD-10-CM

## 2017-02-10 DIAGNOSIS — Z7189 Other specified counseling: Secondary | ICD-10-CM

## 2017-02-10 DIAGNOSIS — R0902 Hypoxemia: Secondary | ICD-10-CM | POA: Diagnosis not present

## 2017-02-10 DIAGNOSIS — R791 Abnormal coagulation profile: Secondary | ICD-10-CM | POA: Diagnosis present

## 2017-02-10 DIAGNOSIS — I130 Hypertensive heart and chronic kidney disease with heart failure and stage 1 through stage 4 chronic kidney disease, or unspecified chronic kidney disease: Secondary | ICD-10-CM | POA: Diagnosis not present

## 2017-02-10 DIAGNOSIS — Z9119 Patient's noncompliance with other medical treatment and regimen: Secondary | ICD-10-CM

## 2017-02-10 DIAGNOSIS — Z8701 Personal history of pneumonia (recurrent): Secondary | ICD-10-CM | POA: Diagnosis not present

## 2017-02-10 DIAGNOSIS — E1122 Type 2 diabetes mellitus with diabetic chronic kidney disease: Secondary | ICD-10-CM | POA: Diagnosis present

## 2017-02-10 DIAGNOSIS — J438 Other emphysema: Secondary | ICD-10-CM

## 2017-02-10 DIAGNOSIS — R809 Proteinuria, unspecified: Secondary | ICD-10-CM | POA: Diagnosis present

## 2017-02-10 DIAGNOSIS — R05 Cough: Secondary | ICD-10-CM | POA: Diagnosis not present

## 2017-02-10 DIAGNOSIS — R590 Localized enlarged lymph nodes: Secondary | ICD-10-CM | POA: Diagnosis present

## 2017-02-10 DIAGNOSIS — L89322 Pressure ulcer of left buttock, stage 2: Secondary | ICD-10-CM | POA: Diagnosis not present

## 2017-02-10 DIAGNOSIS — I10 Essential (primary) hypertension: Secondary | ICD-10-CM | POA: Diagnosis present

## 2017-02-10 DIAGNOSIS — Z7984 Long term (current) use of oral hypoglycemic drugs: Secondary | ICD-10-CM

## 2017-02-10 DIAGNOSIS — Z7982 Long term (current) use of aspirin: Secondary | ICD-10-CM

## 2017-02-10 DIAGNOSIS — J984 Other disorders of lung: Secondary | ICD-10-CM | POA: Diagnosis not present

## 2017-02-10 DIAGNOSIS — N400 Enlarged prostate without lower urinary tract symptoms: Secondary | ICD-10-CM | POA: Diagnosis present

## 2017-02-10 DIAGNOSIS — N471 Phimosis: Secondary | ICD-10-CM

## 2017-02-10 DIAGNOSIS — Z888 Allergy status to other drugs, medicaments and biological substances status: Secondary | ICD-10-CM | POA: Diagnosis not present

## 2017-02-10 DIAGNOSIS — N5089 Other specified disorders of the male genital organs: Secondary | ICD-10-CM | POA: Diagnosis not present

## 2017-02-10 DIAGNOSIS — J9621 Acute and chronic respiratory failure with hypoxia: Secondary | ICD-10-CM | POA: Diagnosis not present

## 2017-02-10 DIAGNOSIS — R1312 Dysphagia, oropharyngeal phase: Secondary | ICD-10-CM | POA: Diagnosis not present

## 2017-02-10 DIAGNOSIS — M545 Low back pain: Secondary | ICD-10-CM | POA: Diagnosis present

## 2017-02-10 DIAGNOSIS — I129 Hypertensive chronic kidney disease with stage 1 through stage 4 chronic kidney disease, or unspecified chronic kidney disease: Secondary | ICD-10-CM | POA: Diagnosis not present

## 2017-02-10 DIAGNOSIS — E11319 Type 2 diabetes mellitus with unspecified diabetic retinopathy without macular edema: Secondary | ICD-10-CM | POA: Diagnosis not present

## 2017-02-10 DIAGNOSIS — J189 Pneumonia, unspecified organism: Secondary | ICD-10-CM | POA: Diagnosis not present

## 2017-02-10 DIAGNOSIS — Z515 Encounter for palliative care: Secondary | ICD-10-CM | POA: Diagnosis present

## 2017-02-10 DIAGNOSIS — J439 Emphysema, unspecified: Secondary | ICD-10-CM | POA: Diagnosis not present

## 2017-02-10 DIAGNOSIS — I878 Other specified disorders of veins: Secondary | ICD-10-CM | POA: Diagnosis not present

## 2017-02-10 DIAGNOSIS — E785 Hyperlipidemia, unspecified: Secondary | ICD-10-CM | POA: Diagnosis present

## 2017-02-10 DIAGNOSIS — R0602 Shortness of breath: Secondary | ICD-10-CM | POA: Diagnosis not present

## 2017-02-10 DIAGNOSIS — M7989 Other specified soft tissue disorders: Secondary | ICD-10-CM | POA: Diagnosis not present

## 2017-02-10 DIAGNOSIS — E8770 Fluid overload, unspecified: Secondary | ICD-10-CM | POA: Diagnosis not present

## 2017-02-10 DIAGNOSIS — N189 Chronic kidney disease, unspecified: Secondary | ICD-10-CM | POA: Diagnosis present

## 2017-02-10 DIAGNOSIS — N184 Chronic kidney disease, stage 4 (severe): Secondary | ICD-10-CM | POA: Diagnosis not present

## 2017-02-10 DIAGNOSIS — I5030 Unspecified diastolic (congestive) heart failure: Secondary | ICD-10-CM | POA: Diagnosis not present

## 2017-02-10 DIAGNOSIS — J90 Pleural effusion, not elsewhere classified: Secondary | ICD-10-CM | POA: Diagnosis not present

## 2017-02-10 DIAGNOSIS — G8929 Other chronic pain: Secondary | ICD-10-CM | POA: Diagnosis present

## 2017-02-10 DIAGNOSIS — D631 Anemia in chronic kidney disease: Secondary | ICD-10-CM | POA: Diagnosis present

## 2017-02-10 DIAGNOSIS — R39198 Other difficulties with micturition: Secondary | ICD-10-CM | POA: Diagnosis not present

## 2017-02-10 DIAGNOSIS — Z5320 Procedure and treatment not carried out because of patient's decision for unspecified reasons: Secondary | ICD-10-CM | POA: Diagnosis not present

## 2017-02-10 DIAGNOSIS — Z79899 Other long term (current) drug therapy: Secondary | ICD-10-CM

## 2017-02-10 DIAGNOSIS — F419 Anxiety disorder, unspecified: Secondary | ICD-10-CM | POA: Diagnosis not present

## 2017-02-10 DIAGNOSIS — Z87891 Personal history of nicotine dependence: Secondary | ICD-10-CM

## 2017-02-10 DIAGNOSIS — R918 Other nonspecific abnormal finding of lung field: Secondary | ICD-10-CM | POA: Diagnosis not present

## 2017-02-10 HISTORY — DX: Acute and chronic respiratory failure, unspecified whether with hypoxia or hypercapnia: J96.20

## 2017-02-10 HISTORY — DX: Acute on chronic diastolic (congestive) heart failure: I50.33

## 2017-02-10 LAB — CBC WITH DIFFERENTIAL/PLATELET
BASOS ABS: 0 10*3/uL (ref 0.0–0.1)
Basophils Relative: 0 %
Eosinophils Absolute: 0.1 10*3/uL (ref 0.0–0.7)
Eosinophils Relative: 1 %
HEMATOCRIT: 35.5 % — AB (ref 39.0–52.0)
Hemoglobin: 11.2 g/dL — ABNORMAL LOW (ref 13.0–17.0)
LYMPHS ABS: 1.8 10*3/uL (ref 0.7–4.0)
LYMPHS PCT: 18 %
MCH: 29.4 pg (ref 26.0–34.0)
MCHC: 31.5 g/dL (ref 30.0–36.0)
MCV: 93.2 fL (ref 78.0–100.0)
MONO ABS: 0.5 10*3/uL (ref 0.1–1.0)
MONOS PCT: 5 %
NEUTROS ABS: 7.3 10*3/uL (ref 1.7–7.7)
Neutrophils Relative %: 76 %
Platelets: 385 10*3/uL (ref 150–400)
RBC: 3.81 MIL/uL — ABNORMAL LOW (ref 4.22–5.81)
RDW: 15.2 % (ref 11.5–15.5)
WBC: 9.7 10*3/uL (ref 4.0–10.5)

## 2017-02-10 LAB — BASIC METABOLIC PANEL
ANION GAP: 12 (ref 5–15)
BUN: 47 mg/dL — AB (ref 6–20)
CALCIUM: 8.5 mg/dL — AB (ref 8.9–10.3)
CO2: 22 mmol/L (ref 22–32)
Chloride: 103 mmol/L (ref 101–111)
Creatinine, Ser: 2.81 mg/dL — ABNORMAL HIGH (ref 0.61–1.24)
GFR calc Af Amer: 24 mL/min — ABNORMAL LOW (ref >60)
GFR calc non Af Amer: 21 mL/min — ABNORMAL LOW (ref >60)
GLUCOSE: 217 mg/dL — AB (ref 65–99)
Potassium: 4.3 mmol/L (ref 3.5–5.1)
Sodium: 137 mmol/L (ref 135–145)

## 2017-02-10 LAB — D-DIMER, QUANTITATIVE (NOT AT ARMC): D DIMER QUANT: 3.74 ug{FEU}/mL — AB (ref 0.00–0.50)

## 2017-02-10 LAB — I-STAT TROPONIN, ED: Troponin i, poc: 0 ng/mL (ref 0.00–0.08)

## 2017-02-10 LAB — BRAIN NATRIURETIC PEPTIDE: B Natriuretic Peptide: 135.5 pg/mL — ABNORMAL HIGH (ref 0.0–100.0)

## 2017-02-10 LAB — POC OCCULT BLOOD, ED: Fecal Occult Bld: NEGATIVE

## 2017-02-10 MED ORDER — LEVOFLOXACIN IN D5W 750 MG/150ML IV SOLN
750.0000 mg | Freq: Once | INTRAVENOUS | Status: AC
  Administered 2017-02-10: 750 mg via INTRAVENOUS
  Filled 2017-02-10: qty 150

## 2017-02-10 MED ORDER — FERROUS SULFATE 325 (65 FE) MG PO TABS
325.0000 mg | ORAL_TABLET | Freq: Every day | ORAL | Status: DC
  Administered 2017-02-11 – 2017-02-18 (×8): 325 mg via ORAL
  Filled 2017-02-10 (×9): qty 1

## 2017-02-10 MED ORDER — ATORVASTATIN CALCIUM 80 MG PO TABS
80.0000 mg | ORAL_TABLET | Freq: Every day | ORAL | Status: DC
  Administered 2017-02-11 – 2017-02-12 (×2): 80 mg via ORAL
  Filled 2017-02-10 (×2): qty 1

## 2017-02-10 MED ORDER — ACETAMINOPHEN 650 MG RE SUPP: 650.0000 mg | Freq: Four times a day (QID) | RECTAL | Status: DC | PRN

## 2017-02-10 MED ORDER — INSULIN ASPART 100 UNIT/ML ~~LOC~~ SOLN
0.0000 [IU] | Freq: Three times a day (TID) | SUBCUTANEOUS | Status: DC
  Administered 2017-02-11: 2 [IU] via SUBCUTANEOUS
  Administered 2017-02-11: 1 [IU] via SUBCUTANEOUS
  Administered 2017-02-11: 2 [IU] via SUBCUTANEOUS
  Administered 2017-02-12 (×3): 1 [IU] via SUBCUTANEOUS
  Administered 2017-02-13: 3 [IU] via SUBCUTANEOUS

## 2017-02-10 MED ORDER — AMLODIPINE BESYLATE 10 MG PO TABS
10.0000 mg | ORAL_TABLET | Freq: Every day | ORAL | Status: DC
  Administered 2017-02-10 – 2017-02-18 (×9): 10 mg via ORAL
  Filled 2017-02-10 (×9): qty 1

## 2017-02-10 MED ORDER — ENOXAPARIN SODIUM 30 MG/0.3ML ~~LOC~~ SOLN
30.0000 mg | SUBCUTANEOUS | Status: DC
  Administered 2017-02-10 – 2017-02-15 (×6): 30 mg via SUBCUTANEOUS
  Filled 2017-02-10 (×7): qty 0.3

## 2017-02-10 MED ORDER — ALBUTEROL SULFATE (2.5 MG/3ML) 0.083% IN NEBU
3.0000 mL | INHALATION_SOLUTION | Freq: Four times a day (QID) | RESPIRATORY_TRACT | Status: DC | PRN
  Administered 2017-02-13: 3 mL via RESPIRATORY_TRACT
  Filled 2017-02-10: qty 3

## 2017-02-10 MED ORDER — SODIUM CHLORIDE 0.9 % IV SOLN
1500.0000 mg | Freq: Once | INTRAVENOUS | Status: AC
  Administered 2017-02-10: 1500 mg via INTRAVENOUS
  Filled 2017-02-10: qty 1500

## 2017-02-10 MED ORDER — HYDRALAZINE HCL 20 MG/ML IJ SOLN: 5.0000 mg | Freq: Four times a day (QID) | INTRAMUSCULAR | Status: DC | PRN

## 2017-02-10 MED ORDER — PIPERACILLIN-TAZOBACTAM 3.375 G IVPB 30 MIN
3.3750 g | Freq: Once | INTRAVENOUS | Status: AC
  Administered 2017-02-10: 3.375 g via INTRAVENOUS
  Filled 2017-02-10: qty 50

## 2017-02-10 MED ORDER — HYDRALAZINE HCL 20 MG/ML IJ SOLN: 10.0000 mg | Freq: Four times a day (QID) | INTRAMUSCULAR | Status: DC | PRN

## 2017-02-10 MED ORDER — ACETAMINOPHEN 325 MG PO TABS: 650.0000 mg | ORAL_TABLET | Freq: Four times a day (QID) | ORAL | Status: DC | PRN

## 2017-02-10 MED ORDER — FUROSEMIDE 10 MG/ML IJ SOLN
40.0000 mg | Freq: Two times a day (BID) | INTRAMUSCULAR | Status: DC
  Administered 2017-02-10 – 2017-02-13 (×6): 40 mg via INTRAVENOUS
  Filled 2017-02-10 (×6): qty 4

## 2017-02-10 NOTE — H&P (Signed)
Date: 02/10/2017               Patient Name:  Ross Bauer MRN: [id number]  DOB: 07-21-1945 Age / Sex: 72 y.o., male   PCP: Doneen Poisson, MD         Medical Service: Internal Medicine Teaching Service         Attending Physician: Dr. Doneen Poisson, MD    First Contact: Dr. Thornell Mule Pager: [phone]  Second Contact: Dr. Nelson Chimes Pager: [phone]       After Hours (After 5p/  First Contact Pager: [phone]  weekends / holidays): Second Contact Pager: [phone]   Chief Complaint: Shortness of breath  History of Present Illness: [name] is a 29 yoM with recent hospital admission for pneumonia who presented to the ED for SOB and concern for right arm/hand swelling. He has a PMHx significant for CKD IV, DMII, HTN, and anemia of chronic disease. He stated that he has been dyspneic since discharge in early January for pneumonia. He stated that he experiences dyspnea on mild exertion and at rest. It is not worse while laying flat or at night. There are no associated symptoms other than a mild unproductive cough that has been present since his previous admission. Patient stated that his home health nurse noted possibly worse SOB this morning and after he had walked to the front door to greet her. She rapidly placed the pulse oximeter and noted his SpO2 was in the 83-85% range on his home oxygen at 2L following a trip across the house. He attested that he is typically dyspneic while walking about as such. Despite what he believed to be not significant change, his nurse called EMS who upon arrival placed the patient on a non-rebreather mask which improved his oxygenation to 97% as per ED nurse note. Patient was brought to the ED for evaluation. He stated that he thought the right hand/arm swelling had begun the previous day but was unsure of this. He denied pain in the arm or recent procedure or injury to that area.   Patient denied, dyspnea, chest pain, abdominal pain, productive cough,  fever, headache, visual changes. He attested to chronically feeling cold, dyspnea greater than 1 month and swelling in his right arm and feet.   In the ED, CXR was read as possible multifocal pneumonia with patchy opacities. CBC failed to demonstrate a leukocytosis and did indicated improvement in his anemia. The patients vitals were at baseline and WNL's with the exception of his RR which is mildly elevated at baseline. CMP was unremarkable being consistent with previous evaluations noting serum Cr near 2.81. BNP was mildly elevated as was his D-dimer. CT-angio chest was not performed due to CKD not on HD nor was a V-Q scan indicated. [place] upper extremity was ordered but not completed prior to transfer of care to the hospital team.   Meds:  Current Meds  Medication Sig  . albuterol (PROVENTIL HFA;VENTOLIN HFA) 108 (90 Base) MCG/ACT inhaler Inhale 2 puffs into the lungs every 6 (six) hours as needed for wheezing or shortness of breath.  [name] amLODipine (NORVASC) 10 MG tablet Take 1 tablet (10 mg total) daily by mouth.  [name] aspirin 81 MG tablet Take 1 tablet (81 mg total) by mouth daily.  [name] atorvastatin (LIPITOR) 80 MG tablet Take 1 tablet (80 mg total) by mouth daily at 6 PM.  . Darbepoetin Alfa (ARANESP) 200 MCG/0.4ML SOSY injection Inject 0.4 mLs (200 mcg total) into the skin  every Sunday at 6pm.  . ferrous sulfate 325 (65 FE) MG tablet Take 1 tablet (325 mg total) by mouth daily with breakfast.  . furosemide (LASIX) 40 MG tablet Take 1 tablet (40 mg total) by mouth daily.  [name] glipiZIDE (GLUCOTROL) 10 MG tablet Take 1 tablet (10 mg total) by mouth 2 (two) times daily before a meal.   Allergies: Allergies as of 02/10/2017 - Review Complete 02/10/2017  Allergen Reaction Noted  . Ace inhibitors     Past Medical History:  Diagnosis Date  . Anemia in chronic kidney disease 06/19/2009  . Background diabetic retinopathy associated with type 2 diabetes mellitus (HCC) 11/13/2013   Bilateral. Followed by  ophthalmologist [name].  [name] BPH (benign prostatic hyperplasia) 09/30/2016  . Chronic back pain 05/09/2007   "all over" (01/02/2017)  . Erectile dysfunction associated with type 2 diabetes mellitus (HCC) 2001  . Essential hypertension   . Hyperlipidemia   . Onychomycosis of right great toe 06/01/2015  . Pneumonia 12/2016   CAP LLL 12/2016  . 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.  . Tobacco abuse 03/20/2007  . Type 2 diabetes mellitus with stage 4 chronic kidney disease (HCC)    Family History:  Family History  Problem Relation Age of Onset  . Cancer Mother        died in 7s.  . Cancer Father        died in 30s  . Healthy Sister   . Healthy Brother   . Unexplained death Brother   . Healthy Daughter   . Healthy Daughter     Social History: Stopped tobacco use 1.5 months prior. Smoked 1/8th ppd for 50 years Denied EtOH or illicit drug use.   Review of Systems: A complete ROS was negative except as per HPI.   Physical Exam: Blood pressure (!) 189/74, pulse (!) 101, temperature 98.2 F (36.8 C), temperature source Oral, resp. rate (!) 28, height 5\' 6"  (1.676 m), weight 156 lb 4.9 oz (70.9 kg), SpO2 92 %. Physical Exam  Constitutional: He is oriented to person, place, and time. He appears well-developed and well-nourished. He does not appear ill. No distress. He is not intubated.  HENT:  Head: Normocephalic and atraumatic.  Eyes: EOM are normal. Pupils are equal, round, and reactive to light.  Neck: No tracheal deviation present. No thyromegaly present.  Cardiovascular: Normal rate and regular rhythm.  No murmur heard. Pulmonary/Chest: No accessory muscle usage. Tachypnea noted. He is not intubated. No respiratory distress. He has decreased breath sounds in the right lower field and the left lower field. He has no wheezes. He has no rhonchi. He has no rales. He exhibits no tenderness and no  deformity.  Abdominal: Soft. Bowel sounds are normal. He exhibits no distension. There is no tenderness.  Musculoskeletal:       Right lower leg: He exhibits tenderness and edema (RLE > LLE by >2cm).       Left lower leg: He exhibits tenderness and edema.  Peripheral edema of the right hand without pain on palpation  Neurological: He is alert and oriented to person, place, and time.  Skin: Skin is warm and dry. Capillary refill takes less than 2 seconds. He is not diaphoretic.  Psychiatric: His mood appears anxious.  Nursing note and vitals reviewed.  EKG: personally reviewed my interpretation is no ST changes, normal sinus rhythm with artifact   CXR: personally reviewed my interpretation  is improved over interval, bilateral lower lobe opacities with R>L consistent with pneumonia vs interstitial edema.   Assessment & Plan by Problem: Active Problems:   Type 2 diabetes mellitus with stage 4 chronic kidney disease (HCC)   Anemia in chronic kidney disease   Essential hypertension   Acute on chronic respiratory failure (HCC)  Assessment: [age] yoM who presented with increased O2 requirements above his baseline of 2L [place] at home. This is most likely secondary to hypervolemia as a result of his CKD. CXR was notable for interstitial edema, pleural effusion and multifocal opacities but when compared to previous films, this has greatly improved in the interval. Vitals where stable with normal HR, BP, temperature, but mildly elevated RR. There was no leukocytosis. Given his labs, physical exam and history, he is most likely hypervolemic and is in need of diuresis. Unable to perform CT angio due to renal function, or VQ due to irregular CXR. US upper and lower ordered but unable to be performed as they are not available after 7pm as per patients floor nurse.   Acute on chronic respiratory failure: Increased demand for O2 from baseline to maintain oxygen saturation >88% -furosemide 40 mg IV BID -continue  O2 via [place] to maintain SpO2 >92% -monitor vitals Q4 hours overnight -albuterol PRN for wheezing or dyspnea -Patient given levofloxacin, zosyn and vanc in ED-no indication to continue at this junction -US right upper and bilateral lower extremities ordered due to unilateral edema of the right hand and increased edema of the right leg > left leg  HTN: Hx of HTN with markedly elevated BP noted on admission  -Continue Amlodipine 10mg  daily -Hydralazine 10mg  IV PRN Q6 hours for Sys BP > 180  Anemia of chronic disease: Chronic anemia. Markedly improved this admission to 11.2. MCV WNL's. Does not appear to be contributing to his current dyspnea. -Monitor daily with CBC -Patient on darbepoetin weekly-consider administrating if present over the weekend  Insulin independent diabetes: -SSI sensitive TID w/ meals -No QHS or long acting at this time -Ferrous sulfate 325mg  daily -Hold home glipizide during admission  CKD IV: Follows closely with his PCP. Cr 2.81 w/ GFR stable. -Continue to monitor  -lasix as above  Diet: Renal, carb modified Fluid: n/a Code: Full DVT PPX: enoxaparin 30mg  Dispo: Admit patient to Observation with expected length of stay less than 2 midnights.  Signed: Lanelle BalHarbrecht, Gaylynn Seiple, MD 02/10/2017, 10:42 PM  Pager: Pager# [phone]

## 2017-02-10 NOTE — Telephone Encounter (Signed)
[facility] states that after [facility] arrived she let pt rest and then checked 02 SAT it was 81 to 85 with 02 in place at 2L/m [place], HR is staying at 92 BP 150/60 lungs are diminshed in the upper lobes, L lower lobe clear, no wheezes or crackles throughout, + edema both feet, nonpitting, pt states they stay that way. L hand is swollen, nontender, pt states he has not noticed the swelling but is clearly swollen. Triage [facility]/ [facility], advised him it would be advisable especially since he lives alone to call 911 now and come in for assistance, he reluctantly agrees. HHN will remain w/ pt until EMS arrives. Sending to Dr Josem KaufmannKlima and attending

## 2017-02-10 NOTE — ED Triage Notes (Signed)
[facility], short of breath, 84% on home 2lit [place], EMS has him on 97% on non-rebreather, patient is A&O X4. EDP at bedside

## 2017-02-10 NOTE — Progress Notes (Signed)
Couldn't get through to vascular to perform patients venous ultrasound for DVT. Called operator and ultrasound both said Vascular department closed at 7pm and will not be available until am. Spoke with MD Harbrecht who was made aware of order.

## 2017-02-10 NOTE — ED Provider Notes (Signed)
[facility] EMERGENCY DEPARTMENT Provider Note   CSN: [id number] Arrival date & time: 02/10/17  1557     History   Chief Complaint Chief Complaint  Patient presents with  . Shortness of Breath    HPI AMELIA MACKEN is a 72 y.o. male.  Patient also notes 2 days of right arm swelling.   The history is provided by the patient.  Shortness of Breath  This is a recurrent problem. The average episode lasts 2 days. The problem occurs continuously.The problem has been rapidly worsening. Associated symptoms include cough. Pertinent negatives include no fever, no rhinorrhea, no sore throat, no ear pain, no sputum production, no wheezing, no chest pain, no vomiting, no abdominal pain and no rash. Treatments tried: home oxygen. The treatment provided no relief. He has had prior hospitalizations. He has had prior ED visits. He has had prior ICU admissions. Associated medical issues include pneumonia.    Past Medical History:  Diagnosis Date  . Anemia in chronic kidney disease 06/19/2009  . Background diabetic retinopathy associated with type 2 diabetes mellitus (HCC) 11/13/2013   Bilateral. Followed by ophthalmologist [name].  [name] BPH (benign prostatic hyperplasia) 09/30/2016  . Chronic back pain 05/09/2007   "all over" (01/02/2017)  . Erectile dysfunction associated with type 2 diabetes mellitus (HCC) 2001  . Essential hypertension   . Hyperlipidemia   . Onychomycosis of right great toe 06/01/2015  . Pneumonia 12/2016   CAP LLL 12/2016  . 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.  . Tobacco abuse 03/20/2007  . Type 2 diabetes mellitus with stage 4 chronic kidney disease [facility])     Patient Active Problem List   Diagnosis Date Noted  . Other fluid overload   . Gastric and duodenal angiodysplasia   . Bradycardia 01/07/2017  . Acute respiratory failure with hypoxia (HCC)   . Shock (HCC)    . Acute on chronic renal insufficiency   . CKD (chronic kidney disease) stage 4, GFR 15-29 ml/min (HCC)   . Occult blood positive stool   . Protein-calorie malnutrition, severe 01/04/2017  . Respiratory arrest (HCC)   . Type 2 diabetes mellitus with hyperosmolar nonketotic hyperglycemia (HCC) 01/02/2017  . Hyperglycemia   . Hyperkalemia   . Multifocal pneumonia   . Benign prostatic hyperplasia with nocturia 09/30/2016  . Onychomycosis of right great toe 06/01/2015  . Background diabetic retinopathy associated with type 2 diabetes mellitus (HCC) 11/13/2013  . Urinary tract infection without hematuria 08/01/2013  . Healthcare maintenance 03/02/2010  . Anemia in chronic kidney disease 06/19/2009  . Hyperlipidemia 03/27/2008  . Chronic low back pain 05/09/2007  . Tobacco abuse 03/20/2007  . Type 2 diabetes mellitus with stage 4 chronic kidney disease (HCC) 11/16/2005  . 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 Essential hypertension 11/16/2005  . 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 ESOPHAGOGASTRODUODENOSCOPY N/A 01/11/2017   Procedure: ESOPHAGOGASTRODUODENOSCOPY (EGD);  Surgeon: Beverley Fiedler, MD;  Location: [facility] ENDOSCOPY;  Service: Gastroenterology;  Laterality: N/A;  . NO PAST SURGERIES         Home Medications    Prior to Admission medications   Medication Sig Start Date End Date Taking? Authorizing Provider  albuterol (PROVENTIL HFA;VENTOLIN HFA) 108 (90 Base) MCG/ACT inhaler Inhale 2 puffs into the lungs every 6 (six) hours  as needed for wheezing or shortness of breath. 01/14/17   Rozann Lesches, MD  amLODipine (NORVASC) 10 MG tablet Take 1 tablet (10 mg total) daily by mouth. 11/16/16   Doneen Poisson, MD  aspirin 81 MG tablet Take 1 tablet (81 mg total) by mouth daily. 04/30/13   Emokpae, Ejiroghene E, MD  atorvastatin (LIPITOR)  80 MG tablet Take 1 tablet (80 mg total) by mouth daily at 6 PM. 03/04/16   Doneen Poisson, MD  Darbepoetin Alfa (ARANESP) 200 MCG/0.4ML SOSY injection Inject 0.4 mLs (200 mcg total) into the skin every Sunday at 6pm. 01/15/17   Rozann Lesches, MD  ferrous sulfate 325 (65 FE) MG tablet Take 1 tablet (325 mg total) by mouth daily with breakfast. 01/15/17   Rozann Lesches, MD  furosemide (LASIX) 40 MG tablet Take 1 tablet (40 mg total) by mouth daily. 05/29/15   Courtney Paris, MD  glipiZIDE (GLUCOTROL) 10 MG tablet Take 1 tablet (10 mg total) by mouth 2 (two) times daily before a meal. 03/04/16   Doneen Poisson, MD  rosuvastatin (CRESTOR) 40 MG tablet Take 1 tablet (40 mg total) by mouth daily. 01/05/11 11/11/11  Priscella Mann, DO    Family History Family History  Problem Relation Age of Onset  . Cancer Mother        died in 29s.  . Cancer Father        died in 52s  . Healthy Sister   . Healthy Brother   . Unexplained death Brother   . Healthy Daughter   . Healthy Daughter     Social History Social History   Tobacco Use  . Smoking status: Current Every Day Smoker    Packs/day: 0.12    Years: 50.00    Pack years: 6.00    Types: Cigarettes  . Smokeless tobacco: Never Used  Substance Use Topics  . Alcohol use: No    Alcohol/week: 0.0 oz  . Drug use: No     Allergies   Ace inhibitors   Review of Systems Review of Systems  Constitutional: Negative for chills and fever.  HENT: Negative for ear pain, rhinorrhea and sore throat.   Eyes: Negative for pain and visual disturbance.  Respiratory: Positive for cough and shortness of breath. Negative for sputum production and wheezing.   Cardiovascular: Negative for chest pain and palpitations.  Gastrointestinal: Negative for abdominal pain and vomiting.  Genitourinary: Negative for dysuria and hematuria.  Musculoskeletal: Negative for arthralgias and back pain.  Skin: Negative for color change and rash.  Neurological:  Negative for seizures and syncope.  All other systems reviewed and are negative.    Physical Exam Updated Vital Signs There were no vitals taken for this visit.  Physical Exam  Constitutional: He is oriented to person, place, and time. He appears well-developed and well-nourished.  HENT:  Head: Normocephalic and atraumatic.  Eyes: EOM are normal. Pupils are equal, round, and reactive to light.  Neck: Neck supple.  Cardiovascular: Regular rhythm.  tachycardic  Pulmonary/Chest: Effort normal and breath sounds normal.  On NRB  Abdominal: Soft. There is no tenderness.  Musculoskeletal: He exhibits edema (2+ pitting edema BLE).  Right arm swelling  Neurological: He is alert and oriented to person, place, and time.  Skin: Skin is warm and dry.  Psychiatric: He has a normal mood and affect.  Nursing note and vitals reviewed.    ED Treatments / Results  Labs (all labs ordered are listed, but only abnormal results are displayed)  Labs Reviewed - No data to display  EKG  EKG Interpretation None       Radiology No results found.  Procedures Procedures (including critical care time)  Medications Ordered in ED Medications - No data to display   Initial Impression / Assessment and Plan / ED Course  I have reviewed the triage vital signs and the nursing notes.  Pertinent labs & imaging results that were available during my care of the patient were reviewed by me and considered in my medical decision making (see chart for details).     [name] is a 72 year old male with past medical history significant for diabetes, CKD, pneumonia with recent admission who presents for shortness of breath.  He was found to be saturating at 84 % on his home 2 L of oxygen.  EMS placed him on nonrebreather and achieved 100% saturations.  EKG obtained, personally reviewed by me, demonstrates nonspecific ST-T changes  Labs obtained including CBC, BMP, BNP, d-dimer, troponin and fecal  occult to the recent admission with GI bleed.  Results are significant for negative fecal occult, negative troponin, anemia improved from baseline, creatinine at baseline. D-dimer positive, dvt study of arm ordered but not yet resulted.  Chest x-ray obtained, personally reviewed by me, demonstrates cardiomegaly with mild edema. Multifocal patches concerning for worsening pneumonia.  Patient given abx for HCAP.  Patient admitted to internal medicine.  Final Clinical Impressions(s) / ED Diagnoses   Final diagnoses:  HCAP (healthcare-associated pneumonia)  Hypoxia  SOB (shortness of breath)    ED Discharge Orders    None       Garey Hamean, Kenyon Eichelberger E, MD 02/11/17 [id number]    Tegeler, Canary Brimhristopher J, MD 02/11/17 1114

## 2017-02-10 NOTE — Telephone Encounter (Signed)
[facility] OT calls and states this is her 1st visit today for assessment Pt met her at the door w/o 02 in place, went over sat down, stated "I dont feel good today" 02 was placed, 02 SAT 84% Waited appr 15 min 02 SAT 86% and remains appr 25 mins later at 86% HR 94 at this time No temp increase Pt refuses ED, EMS, office visit today, he does agree to office visit for mon 2/4at 1000 ACC She is ask to call [facility] RN and have her visit pt within 1 hour, call triage direct line and report findings. At that time will continue to encourage pt to come to ED via EMS or private car if possible Ask pt 1 more time and he adamantly refuses visit today, informed him triage RN was greatly concerned of his status at this time and highly recommends ED via 911, he again refuses Will await HHN call

## 2017-02-10 NOTE — Telephone Encounter (Signed)
EMS tech calls and states before they transport she wanted to let the md know that pt needs SNF placement, states he is not being cared for and is alone. She is reassured that this will be addressed but triage main concern at this time is pt's 02 sat in 80's and could they please transport him to [place] asap. She was agreeable but states he needs snf again she is assured it will be addressed. Pt was "thrown out " of [facility] per pt

## 2017-02-11 ENCOUNTER — Inpatient Hospital Stay (HOSPITAL_COMMUNITY): Payer: Medicare HMO

## 2017-02-11 DIAGNOSIS — Z9981 Dependence on supplemental oxygen: Secondary | ICD-10-CM

## 2017-02-11 DIAGNOSIS — F419 Anxiety disorder, unspecified: Secondary | ICD-10-CM

## 2017-02-11 DIAGNOSIS — Z87891 Personal history of nicotine dependence: Secondary | ICD-10-CM

## 2017-02-11 DIAGNOSIS — E785 Hyperlipidemia, unspecified: Secondary | ICD-10-CM

## 2017-02-11 DIAGNOSIS — D631 Anemia in chronic kidney disease: Secondary | ICD-10-CM

## 2017-02-11 DIAGNOSIS — I130 Hypertensive heart and chronic kidney disease with heart failure and stage 1 through stage 4 chronic kidney disease, or unspecified chronic kidney disease: Principal | ICD-10-CM

## 2017-02-11 DIAGNOSIS — G8929 Other chronic pain: Secondary | ICD-10-CM

## 2017-02-11 DIAGNOSIS — I5030 Unspecified diastolic (congestive) heart failure: Secondary | ICD-10-CM

## 2017-02-11 DIAGNOSIS — M545 Low back pain: Secondary | ICD-10-CM

## 2017-02-11 DIAGNOSIS — J984 Other disorders of lung: Secondary | ICD-10-CM

## 2017-02-11 DIAGNOSIS — I5033 Acute on chronic diastolic (congestive) heart failure: Secondary | ICD-10-CM

## 2017-02-11 DIAGNOSIS — Z7984 Long term (current) use of oral hypoglycemic drugs: Secondary | ICD-10-CM

## 2017-02-11 DIAGNOSIS — Z8701 Personal history of pneumonia (recurrent): Secondary | ICD-10-CM

## 2017-02-11 DIAGNOSIS — M7989 Other specified soft tissue disorders: Secondary | ICD-10-CM

## 2017-02-11 DIAGNOSIS — Z888 Allergy status to other drugs, medicaments and biological substances status: Secondary | ICD-10-CM

## 2017-02-11 DIAGNOSIS — E1122 Type 2 diabetes mellitus with diabetic chronic kidney disease: Secondary | ICD-10-CM

## 2017-02-11 DIAGNOSIS — Z79899 Other long term (current) drug therapy: Secondary | ICD-10-CM

## 2017-02-11 LAB — BASIC METABOLIC PANEL
Anion gap: 9 (ref 5–15)
BUN: 44 mg/dL — ABNORMAL HIGH (ref 6–20)
CHLORIDE: 106 mmol/L (ref 101–111)
CO2: 21 mmol/L — ABNORMAL LOW (ref 22–32)
Calcium: 8.1 mg/dL — ABNORMAL LOW (ref 8.9–10.3)
Creatinine, Ser: 2.74 mg/dL — ABNORMAL HIGH (ref 0.61–1.24)
GFR, EST AFRICAN AMERICAN: 25 mL/min — AB (ref >60)
GFR, EST NON AFRICAN AMERICAN: 21 mL/min — AB (ref >60)
Glucose, Bld: 172 mg/dL — ABNORMAL HIGH (ref 65–99)
POTASSIUM: 3.9 mmol/L (ref 3.5–5.1)
Sodium: 136 mmol/L (ref 135–145)

## 2017-02-11 LAB — CBC
HEMATOCRIT: 30.9 % — AB (ref 39.0–52.0)
Hemoglobin: 9.7 g/dL — ABNORMAL LOW (ref 13.0–17.0)
MCH: 29.2 pg (ref 26.0–34.0)
MCHC: 31.4 g/dL (ref 30.0–36.0)
MCV: 93.1 fL (ref 78.0–100.0)
PLATELETS: 315 10*3/uL (ref 150–400)
RBC: 3.32 MIL/uL — AB (ref 4.22–5.81)
RDW: 15.2 % (ref 11.5–15.5)
WBC: 7.3 10*3/uL (ref 4.0–10.5)

## 2017-02-11 LAB — GLUCOSE, CAPILLARY
GLUCOSE-CAPILLARY: 184 mg/dL — AB (ref 65–99)
GLUCOSE-CAPILLARY: 143 mg/dL — AB (ref 65–99)
GLUCOSE-CAPILLARY: 165 mg/dL — AB (ref 65–99)
GLUCOSE-CAPILLARY: 90 mg/dL (ref 65–99)

## 2017-02-11 LAB — INFLUENZA PANEL BY PCR (TYPE A & B)
INFLAPCR: NEGATIVE
INFLBPCR: NEGATIVE

## 2017-02-11 NOTE — Progress Notes (Signed)
Pt refused to go for his vascular US without providing a good reason for the refusal. Also refused his vitals checked as well as a wash

## 2017-02-11 NOTE — Progress Notes (Signed)
   Subjective:  Pt states he is not feeling short of breath "at the moment".  He denies any chest pain, or nausea/vomiting.    Objective:  Vital signs in last 24 hours: Vitals:   02/11/17 0424 02/11/17 0813 02/11/17 0928 02/11/17 1051  BP: (!) 148/72 (!) 132/49 (!) 130/50 (!) 132/53  Pulse: 92 93  91  Resp: (!) 23 (!) 21  (!) 21  Temp: 98 F (36.7 C) 98.9 F (37.2 C)  98.8 F (37.1 C)  TempSrc:  Oral  Oral  SpO2: 94% 93%  92%  Weight:      Height:       Physical Exam  Constitutional: He is oriented to person, place, and time. He appears well-developed and well-nourished.  Eyes: Right eye exhibits no discharge. Left eye exhibits no discharge. No scleral icterus.  Neck: JVD (8-9cm H2O) present.  Cardiovascular: Normal rate, regular rhythm, normal heart sounds and intact distal pulses. Exam reveals no gallop and no friction rub.  No murmur heard. Pulmonary/Chest: Effort normal. No respiratory distress. He has no wheezes. He has rales (worse on R) in the right lower field and the left lower field.  Abdominal: Soft. Bowel sounds are normal. He exhibits no distension and no mass. There is no tenderness. There is no guarding.  Musculoskeletal:       Right lower leg: He exhibits edema (worse on R).       Left lower leg: He exhibits edema.  Neurological: He is alert and oriented to person, place, and time.    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  Physical exam consistent with volume overload  -pt reports subjective good urine output with lasix 40mg  IV BID but is  likely underdosed will reassess tomorrow morning -home amlodipine started, PRN hydral added as well -strict I's and O's -supplemental O2 as needed  T2DM  Poorly controlled last A1C 11.3 in December 2018  -SSI  Anemia of CKD Chronic, stable, on iron supplementation, weekly epo  -continue iron   CKD IV Chronic stable baseline appears to be around 2.8, was 2.81 on admission  -continue to monitor bmp -will likely need to increase lasix dose     Dispo: Anticipated discharge in approximately 1-2 day(s).   [name], Melvyn Hommes B, MD 02/11/2017, 5:37 PM Thornell MuleBrandon Milca Sytsma MD PGY-1 Internal Medicine Pager # [phone]

## 2017-02-11 NOTE — Progress Notes (Signed)
VASCULAR LAB    Patient refused to come down for exams after transport waited 30 minutes for the doctor, the student nurse, and for the patient to use the restroom. Will attempt study at later time today or tomorrow, as we are busy with patients who are willing to have their exams.   Dmitry Macomber, RVT 02/11/2017, 10:27 AM

## 2017-02-11 NOTE — Progress Notes (Signed)
Internal Medicine Attending  Date: 02/11/2017  Patient name: Ross Bauer Medical record number: [id number] Date of birth: 04/03/1945 Age: 72 y.o. Gender: male  I saw and evaluated the patient. I reviewed the resident's note by Dr. Frances FurbishWinfrey and I agree with the resident's findings and plans as documented in his progress note.  Please see my H&P dated 02/11/2017 and attached to Dr. Godfrey PickHarbrecht's H&P dated 02/10/2017 for the specifics of my evaluation, assessment, and plan from earlier in the day.

## 2017-02-12 ENCOUNTER — Encounter (HOSPITAL_COMMUNITY): Payer: Medicare HMO

## 2017-02-12 DIAGNOSIS — N5089 Other specified disorders of the male genital organs: Secondary | ICD-10-CM

## 2017-02-12 DIAGNOSIS — I878 Other specified disorders of veins: Secondary | ICD-10-CM

## 2017-02-12 DIAGNOSIS — R39198 Other difficulties with micturition: Secondary | ICD-10-CM

## 2017-02-12 DIAGNOSIS — N471 Phimosis: Secondary | ICD-10-CM

## 2017-02-12 DIAGNOSIS — J9621 Acute and chronic respiratory failure with hypoxia: Secondary | ICD-10-CM | POA: Diagnosis not present

## 2017-02-12 LAB — BASIC METABOLIC PANEL
Anion gap: 12 (ref 5–15)
BUN: 36 mg/dL — ABNORMAL HIGH (ref 6–20)
CALCIUM: 7.9 mg/dL — AB (ref 8.9–10.3)
CHLORIDE: 100 mmol/L — AB (ref 101–111)
CO2: 22 mmol/L (ref 22–32)
CREATININE: 2.84 mg/dL — AB (ref 0.61–1.24)
GFR calc non Af Amer: 21 mL/min — ABNORMAL LOW (ref >60)
GFR, EST AFRICAN AMERICAN: 24 mL/min — AB (ref >60)
GLUCOSE: 106 mg/dL — AB (ref 65–99)
Potassium: 3.9 mmol/L (ref 3.5–5.1)
Sodium: 134 mmol/L — ABNORMAL LOW (ref 135–145)

## 2017-02-12 LAB — GLUCOSE, CAPILLARY
Glucose-Capillary: 140 mg/dL — ABNORMAL HIGH (ref 65–99)
Glucose-Capillary: 145 mg/dL — ABNORMAL HIGH (ref 65–99)
Glucose-Capillary: 124 mg/dL — ABNORMAL HIGH (ref 65–99)
Glucose-Capillary: 174 mg/dL — ABNORMAL HIGH (ref 65–99)

## 2017-02-12 NOTE — Care Management Note (Signed)
Case Management Note  Patient Details  Name: Ross Bauer MRN: [id number] Date of Birth: 09/08/1945  Subjective/Objective:        Pt admitted recently for pneumonia and d/c to SNF.  [facility]". [facility] providing HH services and home O2.  Pt states he has portable tanks, concentrater, walker.            Action/Plan: Will continue to follow for CM needs.  Will need HH resumption orders.  Expected Discharge Date:                  Expected Discharge Plan:  Home w Home Health Services  In-House Referral:  NA  Discharge planning Services  CM Consult  Post Acute Care Choice:  Resumption of Svcs/PTA Provider Choice offered to:     DME Arranged:    DME Agency:     HH Arranged:    HH Agency:     Status of Service:  In process, will continue to follow  If discussed at Long Length of Stay Meetings, dates discussed:    Additional Comments:  Verdene LennertGoldean, Mao Lockner K, RN 02/12/2017, 4:19 PM

## 2017-02-12 NOTE — Progress Notes (Addendum)
   Subjective:  Pt states he is not sure if he is any more or less short of breath than when he arrived.  He denies any chest pain, or nausea/vomiting.    Objective:  Vital signs in last 24 hours: Vitals:   02/11/17 2118 02/12/17 0437 02/12/17 0807 02/12/17 0932  BP: (!) 142/56 (!) 129/48 (!) 140/59 (!) 160/58  Pulse: 96 93 93   Resp: 20 20 20    Temp: 98.3 F (36.8 C) 99.3 F (37.4 C) 99.3 F (37.4 C)   TempSrc: Oral Oral Oral   SpO2: 91% 91% 91%   Weight:  144 lb 9.6 oz (65.6 kg)    Height:       Physical Exam  Constitutional: He is oriented to person, place, and time. He appears well-developed and well-nourished.  Eyes: Right eye exhibits no discharge. Left eye exhibits no discharge. No scleral icterus.  Neck: JVD (8-9cm H2O) present.  Cardiovascular: Normal rate, regular rhythm, normal heart sounds and intact distal pulses. Exam reveals no gallop and no friction rub.  No murmur heard. Pulmonary/Chest: Effort normal. No respiratory distress. He has no wheezes. He has rales (worse on R) in the right lower field and the left lower field.  Abdominal: Soft. Bowel sounds are normal. He exhibits no distension and no mass. There is no tenderness. There is no guarding.  Musculoskeletal:       Right lower leg: He exhibits edema (worse on R).       Left lower leg: He exhibits edema.  Neurological: He is alert and oriented to person, place, and time.    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  Physical exam  consistent with volume overload  -urine output okay with 40 of lasix IV BID but would like to increase.  Pt refusing labs ideally would like to see how creatinine is doing before adjusting dose -home amlodipine started, PRN hydral added as well -strict I's and O's -supplemental O2 as needed  T2DM  Poorly controlled last A1C 11.3 in December 2018  -SSI  Anemia of CKD Chronic, stable, on iron supplementation, weekly epo  -continue iron   CKD IV Chronic stable baseline appears to be around 2.8, was 2.81 on admission  -continue to monitor bmp -will likely need to increase lasix dose     Dispo: Anticipated discharge in approximately 1-2 day(s).   [name], Yesennia Hirota B, MD 02/12/2017, 9:42 AM Thornell MuleBrandon Ji Feldner MD PGY-1 Internal Medicine Pager # [phone]

## 2017-02-12 NOTE — Progress Notes (Signed)
Internal Medicine Attending  Date: 02/12/2017  Patient name: Ross ManifoldJulius L Bauer Medical record number: [id number] Date of birth: 06/20/1945 Age: 72 y.o. Gender: male  I saw and evaluated the patient. I reviewed the resident's note by Dr. Frances FurbishWinfrey and I agree with the resident's findings and plans as documented in his progress note.  I sat down with [name] today to discuss our concerns regarding his pulmonary status. I freely admitted we did not know the exact etiology of his problem, but we did not think it is an infection. This is especially true given that he is no better after being treated for an infection during the previous admission. I let him know that I was currently concerned about fluid in the lung and that we would try hard to remove the fluid by making him urinate. Along these lines, we discussed the importance of monitoring his kidney function via blood work while we do this given the risks associated with the medication. He expressed his frustration with the lab draw today but was willing to allow labs to be drawn if it would help us make decisions on his therapy. Thus we are awaiting the results of the blood draw from today.  He also mentioned to me that he has difficulty urinating sometimes because of the foreskin on his penis. Given this we will call Urology tomorrow to ask them to assess his candidacy for a circumcision.

## 2017-02-12 NOTE — Progress Notes (Signed)
Pt declined having his lab work drawn at 1005hrs stating 'I am not going to let anyone stick me again today'. Pt says that someone already unsuccessfully tried to draw labs from him early this morning

## 2017-02-12 NOTE — Progress Notes (Signed)
Pt negative for flu, droplet precautions discontinued.

## 2017-02-13 ENCOUNTER — Inpatient Hospital Stay (HOSPITAL_COMMUNITY): Payer: Medicare HMO

## 2017-02-13 ENCOUNTER — Encounter (HOSPITAL_COMMUNITY): Payer: Self-pay | Admitting: General Practice

## 2017-02-13 ENCOUNTER — Ambulatory Visit: Payer: Medicare HMO

## 2017-02-13 DIAGNOSIS — N471 Phimosis: Secondary | ICD-10-CM

## 2017-02-13 DIAGNOSIS — M7989 Other specified soft tissue disorders: Secondary | ICD-10-CM

## 2017-02-13 LAB — GLUCOSE, CAPILLARY
Glucose-Capillary: 222 mg/dL — ABNORMAL HIGH (ref 65–99)
Glucose-Capillary: 125 mg/dL — ABNORMAL HIGH (ref 65–99)
Glucose-Capillary: 114 mg/dL — ABNORMAL HIGH (ref 65–99)
Glucose-Capillary: 167 mg/dL — ABNORMAL HIGH (ref 65–99)

## 2017-02-13 LAB — BASIC METABOLIC PANEL
Anion gap: 11 (ref 5–15)
BUN: 38 mg/dL — ABNORMAL HIGH (ref 6–20)
CALCIUM: 8 mg/dL — AB (ref 8.9–10.3)
CO2: 24 mmol/L (ref 22–32)
Chloride: 100 mmol/L — ABNORMAL LOW (ref 101–111)
Creatinine, Ser: 2.84 mg/dL — ABNORMAL HIGH (ref 0.61–1.24)
GFR, EST AFRICAN AMERICAN: 24 mL/min — AB (ref >60)
GFR, EST NON AFRICAN AMERICAN: 21 mL/min — AB (ref >60)
Glucose, Bld: 188 mg/dL — ABNORMAL HIGH (ref 65–99)
Potassium: 3.6 mmol/L (ref 3.5–5.1)
Sodium: 135 mmol/L (ref 135–145)

## 2017-02-13 LAB — TSH: TSH: 2.173 u[IU]/mL (ref 0.350–4.500)

## 2017-02-13 MED ORDER — FUROSEMIDE 10 MG/ML IJ SOLN: 80.0000 mg | Freq: Two times a day (BID) | INTRAMUSCULAR | Status: DC

## 2017-02-13 MED ORDER — INSULIN ASPART 100 UNIT/ML ~~LOC~~ SOLN: 0.0000 [IU] | Freq: Every day | SUBCUTANEOUS | Status: DC

## 2017-02-13 MED ORDER — ATORVASTATIN CALCIUM 80 MG PO TABS
80.0000 mg | ORAL_TABLET | Freq: Every day | ORAL | Status: DC
  Administered 2017-02-13 – 2017-02-16 (×4): 80 mg via ORAL
  Filled 2017-02-13 (×5): qty 1

## 2017-02-13 MED ORDER — INSULIN ASPART 100 UNIT/ML ~~LOC~~ SOLN
0.0000 [IU] | Freq: Three times a day (TID) | SUBCUTANEOUS | Status: DC
  Administered 2017-02-13: 2 [IU] via SUBCUTANEOUS
  Administered 2017-02-14 (×2): 3 [IU] via SUBCUTANEOUS
  Administered 2017-02-15: 2 [IU] via SUBCUTANEOUS
  Administered 2017-02-15 (×2): 3 [IU] via SUBCUTANEOUS
  Administered 2017-02-16 (×2): 2 [IU] via SUBCUTANEOUS
  Administered 2017-02-17: 3 [IU] via SUBCUTANEOUS
  Administered 2017-02-18 (×2): 2 [IU] via SUBCUTANEOUS

## 2017-02-13 MED ORDER — FUROSEMIDE 10 MG/ML IJ SOLN
60.0000 mg | Freq: Two times a day (BID) | INTRAMUSCULAR | Status: AC
  Administered 2017-02-13: 60 mg via INTRAVENOUS
  Filled 2017-02-13: qty 6

## 2017-02-13 MED ORDER — POTASSIUM CHLORIDE CRYS ER 20 MEQ PO TBCR
20.0000 meq | EXTENDED_RELEASE_TABLET | Freq: Two times a day (BID) | ORAL | Status: AC
  Administered 2017-02-13 (×2): 20 meq via ORAL
  Filled 2017-02-13 (×2): qty 1

## 2017-02-13 MED ORDER — ATORVASTATIN CALCIUM 40 MG PO TABS: 60.0000 mg | ORAL_TABLET | Freq: Every day | ORAL | Status: DC

## 2017-02-13 NOTE — Progress Notes (Signed)
VASCULAR LAB PRELIMINARY  PRELIMINARY  PRELIMINARY  PRELIMINARY  Bilateral lower extremity venous duplex completed.    Preliminary report:  There is no DVT or SVT noted in the bilateral lower extremities.   Shadi Larner, RVT 02/13/2017, 12:57 PM

## 2017-02-13 NOTE — Progress Notes (Signed)
Internal Medicine Attending  Date: 02/13/2017  Patient name: Ross Bauer Medical record number: [id number] Date of birth: 10/18/1945 Age: 72 y.o. Gender: male  I saw and evaluated the patient. I reviewed the resident's note by Dr. Frances FurbishWinfrey and I agree with the resident's findings and plans as documented in his progress note.  When seen on rounds this morning Ross Bauer was resting comfortably in bed. He stated his breathing was okay today. Other than feeling cold he had no other complaints. His renal function has been stable despite the diuresis. We will therefore continue with the IV diuresis as outlined by Dr. Frances FurbishWinfrey. We are checking a TSH today because of his cold intolerance. An outpatient urology appointment has been established to assess for circumcision. I believe he will require several more days of IV diuresis before being ready for discharge home.

## 2017-02-13 NOTE — Discharge Summary (Signed)
Name: Ross Bauer MRN: [id number] DOB: 09-18-1945 72 y.o. PCP: Doneen Poisson, MD  Date of Admission: 02/10/2017  3:57 PM Date of Discharge:  Attending Physician: Doneen Poisson, MD  Discharge Diagnosis: 1.  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)   Acute on chronic diastolic heart failure (HCC)   Acquired phimosis   Hypoxia   SOB (shortness of breath)   Paraseptal emphysema (HCC)   Palliative care by specialist   Goals of care, counseling/discussion   Encounter for hospice care discussion   Discharge Medications: Allergies as of 02/17/2017      Reactions   Ace Inhibitors    REACTION: intolerance      Medication List    STOP taking these medications   atorvastatin 80 MG tablet Commonly known as:  LIPITOR   glipiZIDE 10 MG tablet Commonly known as:  GLUCOTROL     TAKE these medications   albuterol 108 (90 Base) MCG/ACT inhaler Commonly known as:  PROVENTIL HFA;VENTOLIN HFA Inhale 2 puffs into the lungs every 6 (six) hours as needed for wheezing or shortness of breath.   amLODipine 10 MG tablet Commonly known as:  NORVASC Take 1 tablet (10 mg total) daily by mouth.   aspirin 81 MG tablet Take 1 tablet (81 mg total) by mouth daily.   Darbepoetin Alfa 200 MCG/0.4ML Sosy injection Commonly known as:  ARANESP Inject 0.4 mLs (200 mcg total) into the skin every Sunday at 6pm.   feeding supplement (NEPRO CARB STEADY) Liqd Take 237 mLs by mouth 3 (three) times daily between meals.   ferrous sulfate 325 (65 FE) MG tablet Take 1 tablet (325 mg total) by mouth daily with breakfast.   furosemide 40 MG tablet Commonly known as:  LASIX Take 1 tablet (40 mg total) by mouth daily.   insulin aspart 100 UNIT/ML  injection Commonly known as:  novoLOG Use Sliding Scale 3 times daily with meals CBG 70-120 use 0 units CBG 121-150 use 2 units CBG 151-200 use 3 units CBG 201-250 use 5 units CBG 251-300 use 8 units CBG 301-350 use 11 units CBG 351-400 use 15 units   insulin aspart 100 UNIT/ML injection Commonly known as:  novoLOG Use Sliding Scale once daily before bedtime CBG 70-120 use 0 units CBG 121-150 use 0 units CBG 151-200 use 0 units CBG 201-250 use 2 units CBG 251-300 use 3 units CBG 301-350 use 4 units CBG 351-400 use 5 units            Durable Medical Equipment  (From admission, onward)        Start     Ordered   02/17/17 0000  For home use only DME oxygen    Question Answer Comment  Mode or (Route) Nasal cannula   Liters per Minute 4   Frequency Continuous (stationary and portable oxygen unit needed)   Oxygen conserving device Yes   Oxygen delivery system Gas      02 /08/19 1610      Disposition and follow-up:   [name] was discharged from [facility] in [place] condition.  At the hospital follow up visit please address:  1.   Acute on chronic HFpEF w/hypoxemia on 2L [place] at home -continue to provide pt  with lasix 40mg  daily  -now pt requiring 4L [place], ensure oxygenation is preserved  T2DM  -can continue SSI moderate -would avoid home glipizide due to potential for hypoglycemia with worsening renal function   Anemia of CKD -continue iron supplemenation, epo     CKD IV w/proteinuria -kidney function will likely continue to decline -continue to monitor     Phimosis -pt does have outpatient appointment with urologist for circumcision in about 10 days as shown below can discuss this with patient    2.  Labs / imaging needed at time of follow-up: bmp, cbc, magnesium  3.  Pending labs/ test needing follow-up: none  Follow-up Appointments: Follow-up Information    Hildred Laser, MD. Go on 02/28/2017.   Specialty:  Urology Why:  At 9:30  AM Contact information: 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   Patient arrived at the emergency department with signs and symptoms suggestive of volume overload in the setting of heart failure with preserved ejection fraction.  There was initially some concern for pneumonia based off his chest x-ray however the patient had no clinical signs or symptoms suggestive of infectious etiology.  Unfortunately due to the patient's extensive kidney disease he is CKD stage IV his baseline creatinine appears to be around 2.8-3.0.  It was difficult to diurese him.  To complicate matters further his albumin was very low and he appears malnourished.  He was found to have significant proteinuria on labs although not nephrotic range.  We consulted various specialties nephrology, cardiology, pulmonology, they had varied responses however most of the consultants with the exception of the pulmonologist felt the patient was euvolemic.  We proceeded to get a CT of the chest without contrast to further evaluate the extent of the patient's pulmonary disease.  The CT scan showed significant diffuse paraseptal emphysema worse in the upper lobes. Additionally it showed an incredibly large amount of dependent pleural effusions likely secondary to the patient's underlying heart failure.  Unfortunately, the patient was also very  obstructive in his own care and was angry and many times refusing necessary labs and testing that  would have helped his situation.  We continued to try and diurese the fluid out of the patient but his kidneys did not respond very well.  He did not want thoracentesis or dialysis or any other aggressive therapies such as those. ultimately after a significant hospital course the patient decided he was done with these interventions and wished to go home with hospice.     Discharge Vitals:   BP (!) 111/56 (BP Location: Left Arm)   Pulse 99   Temp 98.6 F (37 C) (Oral)   Resp 17   Ht 5\' 6"  (1.676 m)   Wt 166 lb 7.2 oz (75.5 kg)   SpO2 (!) 89%   BMI 26.87 kg/m  Pertinent Labs, Studies, and Procedures:  BMP Latest Ref Rng & Units 02/17/2017 02/16/2017 02/15/2017  Glucose 65 - 99 mg/dL - 528(U183(H) 132(G121(H)  BUN 6 - 20 mg/dL - 40(N34(H) 02(V32(H)  Creatinine 0.61 - 1.24 mg/dL 2.53(G3.08(H) 6.44(I2.99(H) 3.47(Q2.85(H)  BUN/Creat Ratio 10 - 24 - - -  Sodium 135 - 145 mmol/L - 135 134(L)  Potassium 3.5 - 5.1 mmol/L - 4.2 4.0  Chloride 101 - 111 mmol/L - 100(L) 102  CO2 22 - 32 mmol/L - 26 23  Calcium 8.9 - 10.3 mg/dL - 7.9(L) 8.1(L)   CBC Latest Ref Rng & Units 02/11/2017 02/10/2017 01/16/2017  WBC 4.0 - 10.5 K/uL 7.3 9.7 7.2  Hemoglobin 13.0 - 17.0 g/dL 2.5(Z9.7(L) 11.2(L) 8.3(L)  Hematocrit 39.0 - 52.0 % 30.9(L) 35.5(L) 26.7(L)  Platelets 150 - 400 K/uL 315 385 279    CLINICAL DATA:  Hypoxemia, history hypertension, chronic kidney disease, type II diabetes mellitus, former smoker, CHF   EXAM: CT CHEST WITHOUT CONTRAST   TECHNIQUE: Multidetector CT imaging of the chest was performed following the standard protocol without IV contrast. Sagittal and coronal MPR images reconstructed from axial data set.   COMPARISON:  Chest radiograph 02/10/2017   FINDINGS: Cardiovascular: Atherosclerotic calcifications of aorta, proximal great vessels and coronary arteries. Small pericardial effusion. Heart normal size. Aorta normal  caliber.   Mediastinum/Nodes: Scattered normal size mediastinal lymph nodes with additional mildly enlarged lymph nodes prevascular 12 mm short axis image 46 and at AP window 11 mm short axis image 60. Esophagus grossly unremarkable. Base of cervical region normal appearance. No axillary adenopathy.   Lungs/Pleura: Moderate BILATERAL pleural effusions. Severe emphysematous changes greatest in upper lobes. Significant atelectasis in BILATERAL lower lobes and in the posterior aspect of the LEFT upper lobe. Partial atelectasis RIGHT middle lobe. No definite pulmonary infiltrate or mass. No pneumothorax.   Upper Abdomen: Unremarkable   Musculoskeletal: No acute osseous findings.   IMPRESSION: Significant emphysematous changes with moderate BILATERAL pleural effusions and significant compressive atelectasis of the lungs.   Extensive atherosclerotic disease including coronary arteries with small pericardial effusion.   Few nonspecific minimally enlarged mediastinal lymph nodes.   Remainder of exam unremarkable.   Aortic Atherosclerosis (ICD10-I70.0) and Emphysema (ICD10-J43.9).     Electronically Signed   By: Ulyses SouthwardMark  Boles M.D.   On: 02/15/2017 18:48    Discharge Instructions: Discharge Instructions    Diet - low sodium heart healthy   Complete by:  As directed    Discharge instructions   Complete by:  As directed    Please continue your medicines and follow instructions outlined in your discharge summary.   For home use only DME oxygen   Complete by:  As directed    Mode or (Route):  Nasal cannula   Liters per Minute:  4   Frequency:  Continuous (stationary and portable oxygen unit needed)   Oxygen conserving device:  Yes   Oxygen delivery system:  Gas   Increase activity slowly   Complete by:  As directed       Signed: [name], Cathi Hazan B, MD 02/17/2017, 6:04 PM  Thornell MuleBrandon Markhi Kleckner MD PGY-1 Internal Medicine Pager # [phone]

## 2017-02-13 NOTE — Progress Notes (Signed)
VASCULAR LAB PRELIMINARY  PRELIMINARY  PRELIMINARY  PRELIMINARY  Right upper extremity venous duplex completed.    Preliminary report:  There is no evidence of DVT or SVT noted in the right upper extremity.   Sible Straley, RVT 02/13/2017, 12:56 PM

## 2017-02-13 NOTE — Progress Notes (Addendum)
   Subjective:  Pt states he feels his breathing is doing alright today.  He denies any chest pain, or nausea/vomiting.    Objective:  Vital signs in last 24 hours: Vitals:   02/12/17 0932 02/12/17 1505 02/12/17 2100 02/13/17 0540  BP: (!) 160/58 (!) 125/47 (!) 143/57 116/63  Pulse:  89 95 100  Resp:   18 18  Temp:  99.4 F (37.4 C) 98.6 F (37 C) 98.9 F (37.2 C)  TempSrc:  Oral Oral Oral  SpO2:  93% 93% 94%  Weight:    146 lb 6.2 oz (66.4 kg)  Height:       Physical Exam  Constitutional: He is oriented to person, place, and time. He appears well-developed and well-nourished.  Eyes: Right eye exhibits no discharge. Left eye exhibits no discharge. No scleral icterus.  Neck: JVD (7-8 cm H2O) present.  Cardiovascular: Normal rate, regular rhythm, normal heart sounds and intact distal pulses. Exam reveals no gallop and no friction rub.  No murmur heard. Pulmonary/Chest: Effort normal. No respiratory distress. He has no wheezes. He has rales (much improved) in the left lower field.  Abdominal: Soft. Bowel sounds are normal. He exhibits no distension and no mass. There is no tenderness. There is no guarding.  Musculoskeletal:       Right lower leg: He exhibits edema.       Left lower leg: He exhibits no edema.  Neurological: He is alert and oriented to person, place, and time.    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  Physical exam consistent with volume overload  -40 of IV  lasix this morning will give 60mg  this evening -home amlodipine started, PRN hydral added as well -strict I's and O's -continuous pulse ox -supplemental O2 as needed with weaning goals placed -was 94% on 3L this morning down from 4L   T2DM  Poorly controlled last A1C 11.3 in December 2018  -SSI increased from thin to moderate today  Anemia of CKD Chronic, stable, on iron supplementation, weekly epo  -continue iron   CKD IV Chronic stable baseline appears to be around 2.8, was 2.81 on admission  -stable so far continue to monitor bmp closely with diuresis and poor reserve  -will increase lasix dose today  Phimosis  -contacted urology, will not come by and see pt unless completely obstructed -made outpatient appointment for 02/28/17 for circumcision  Dispo: Anticipated discharge in approximately 1-2 day(s).   [name], Mel Tadros B, MD 02/13/2017, 10:04 AM Thornell MuleBrandon Sherah Lund MD PGY-1 Internal Medicine Pager # [phone]

## 2017-02-13 NOTE — Progress Notes (Addendum)
Occupational Therapy Evaluation Patient Details Name: Ross Bauer MRN: [id number] DOB: 1945/03/12 Today's Date: 02/13/2017    History of Present Illness Pt is a 72 y.o. male with a history of type 2 diabetes complicated by stage IV chronic kidney disease, hypertension, hyperlipidemia, tobacco abuse, and chronic low back pain who was recently discharged from the hospital for a multifocal pneumonia. He was admitted after episodes on hypoxia with home health OT with concern for acute on chronic HFpEF with hypoxemia on 2L [place] at home. However, medical team continuing to investigate underlying cause of his pulmonary status.   Clinical Impression   Pt with limited participation in OT evaluation this session. He was agreeable to strength assessment and ADL at bed level only and becoming agitated with OT at times. Pt currently requiring min assist for UB ADL and mod assist for LB ADL at bed level. Pt lethargic throughout assessment and falling asleep at times making full cognitive assessment difficult as well. He would benefit from continued OT services while admitted to improve independence and safety with ADL and functional mobility. Will continue to update D/C recommendations as ability to participate improves.     Follow Up Recommendations  Home health OT;Supervision/Assistance - 24 hour    Equipment Recommendations  None recommended by OT    Recommendations for Other Services       Precautions / Restrictions Restrictions Weight Bearing Restrictions: No      Mobility Bed Mobility               General bed mobility comments: Pt declined bed mobility.   Transfers                 General transfer comment: Pt declined transfers.     Balance                                           ADL either performed or assessed with clinical judgement   ADL Overall ADL's : Needs assistance/impaired Eating/Feeding: Set up;Bed level   Grooming: Set up;Bed level    Upper Body Bathing: Minimal assistance;Bed level   Lower Body Bathing: Moderate assistance;Bed level   Upper Body Dressing : Minimal assistance;Bed level   Lower Body Dressing: Moderate assistance;Bed level                 General ADL Comments: Difficult to fully assess at this time due to pt unwillingness to complete greater than bed level evaluation.      Vision   Additional Comments: Need to continue to assess as pt resistant to participation in visual assessment. Noted redness around eyes with yellow tinge around pupils. Pt opening eyes minimally but tracking well.      Perception     Praxis      Pertinent Vitals/Pain Pain Assessment: No/denies pain     Hand Dominance     Extremity/Trunk Assessment Upper Extremity Assessment Upper Extremity Assessment: Generalized weakness   Lower Extremity Assessment Lower Extremity Assessment: Defer to PT evaluation       Communication Communication Communication: HOH   Cognition Arousal/Alertness: Lethargic Behavior During Therapy: Agitated Overall Cognitive Status: No family/caregiver present to determine baseline cognitive functioning                                 General Comments: Need to  continue to assess. Pt lethargic and becoming agitated with encouragement to participate in tasks.    General Comments       Exercises     Shoulder Instructions      Home Living Family/patient expects to be discharged to:: Private residence Living Arrangements: Other (Comment)(grandson?) Available Help at Discharge: Family Type of Home: Apartment Home Access: Stairs to enter Secretary/administratorntrance Stairs-Number of Steps: 10 Entrance Stairs-Rails: Right(going up)       Bathroom Shower/Tub: Walk-in shower         Home Equipment: Environmental consultantWalker - 2 wheels          Prior Functioning/Environment          Comments: Pt declined all mobility adamantly.         OT Problem List: Impaired UE functional use;Decreased  strength;Decreased activity tolerance;Decreased safety awareness;Decreased knowledge of use of DME or AE;Decreased knowledge of precautions      OT Treatment/Interventions: Self-care/ADL training;Therapeutic exercise;Energy conservation;DME and/or AE instruction;Therapeutic activities;Patient/family education;Balance training;Visual/perceptual remediation/compensation;Cognitive remediation/compensation    OT Goals(Current goals can be found in the care plan section) Acute Rehab OT Goals Patient Stated Goal: get some sleep OT Goal Formulation: With patient Time For Goal Achievement: 02/27/17 Potential to Achieve Goals: Good ADL Goals Pt Will Perform Grooming: with supervision;standing Pt Will Perform Upper Body Dressing: with set-up;sitting Pt Will Perform Lower Body Dressing: with supervision;sit to/from stand Pt Will Transfer to Toilet: with supervision;ambulating;bedside commode(BSC over toilet) Pt Will Perform Toileting - Clothing Manipulation and hygiene: with supervision;sit to/from stand Pt/caregiver will Perform Home Exercise Program: Increased strength;Both right and left upper extremity;With written HEP provided;With Supervision  OT Frequency: Min 2X/week   Barriers to D/C:            Co-evaluation              AM-PAC PT "6 Clicks" Daily Activity     Outcome Measure Help from another person eating meals?: A Little Help from another person taking care of personal grooming?: A Little Help from another person toileting, which includes using toliet, bedpan, or urinal?: A Lot Help from another person bathing (including washing, rinsing, drying)?: A Lot Help from another person to put on and taking off regular upper body clothing?: A Little Help from another person to put on and taking off regular lower body clothing?: A Lot 6 Click Score: 15   End of Session Nurse Communication: Mobility status  Activity Tolerance: Patient limited by lethargy Patient left: in bed;with  call bell/phone within reach  OT Visit Diagnosis: Other abnormalities of gait and mobility (R26.89);Muscle weakness (generalized) (M62.81)                Time: 9147-82951411-1420 OT Time Calculation (min): 9 min Charges:  OT General Charges $OT Visit: 1 Visit OT Evaluation $OT Eval Low Complexity: 1 Low G-Codes:     Doristine Sectionharity A Ratasha Fabre, MS OTR/L  Pager: [phone]   Abbigal Radich A Equilla Que 02/13/2017, 5:12 PM

## 2017-02-14 DIAGNOSIS — R918 Other nonspecific abnormal finding of lung field: Secondary | ICD-10-CM

## 2017-02-14 LAB — COMPREHENSIVE METABOLIC PANEL
ALBUMIN: 2.1 g/dL — AB (ref 3.5–5.0)
ALT: 19 U/L (ref 17–63)
ANION GAP: 12 (ref 5–15)
AST: 26 U/L (ref 15–41)
Alkaline Phosphatase: 111 U/L (ref 38–126)
BILIRUBIN TOTAL: 0.4 mg/dL (ref 0.3–1.2)
BUN: 37 mg/dL — AB (ref 6–20)
CO2: 21 mmol/L — ABNORMAL LOW (ref 22–32)
Calcium: 8 mg/dL — ABNORMAL LOW (ref 8.9–10.3)
Chloride: 102 mmol/L (ref 101–111)
Creatinine, Ser: 2.86 mg/dL — ABNORMAL HIGH (ref 0.61–1.24)
GFR calc Af Amer: 24 mL/min — ABNORMAL LOW (ref >60)
GFR calc non Af Amer: 20 mL/min — ABNORMAL LOW (ref >60)
GLUCOSE: 247 mg/dL — AB (ref 65–99)
POTASSIUM: 4.5 mmol/L (ref 3.5–5.1)
Sodium: 135 mmol/L (ref 135–145)
TOTAL PROTEIN: 6.2 g/dL — AB (ref 6.5–8.1)

## 2017-02-14 LAB — GLUCOSE, CAPILLARY
GLUCOSE-CAPILLARY: 176 mg/dL — AB (ref 65–99)
GLUCOSE-CAPILLARY: 169 mg/dL — AB (ref 65–99)
Glucose-Capillary: 99 mg/dL (ref 65–99)
Glucose-Capillary: 164 mg/dL — ABNORMAL HIGH (ref 65–99)

## 2017-02-14 LAB — PROTEIN / CREATININE RATIO, URINE
CREATININE, URINE: 85.37 mg/dL
PROTEIN CREATININE RATIO: 1.09 mg/mg{creat} — AB (ref 0.00–0.15)
Total Protein, Urine: 93 mg/dL

## 2017-02-14 MED ORDER — FUROSEMIDE 10 MG/ML IJ SOLN
60.0000 mg | Freq: Two times a day (BID) | INTRAMUSCULAR | Status: AC
  Administered 2017-02-14: 60 mg via INTRAVENOUS
  Filled 2017-02-14: qty 6

## 2017-02-14 NOTE — Evaluation (Signed)
Physical Therapy Evaluation Patient Details Name: Ross Bauer MRN: [id number] DOB: 1945-01-20 Today's Date: 02/14/2017   History of Present Illness  Pt is a 72 y.o. male with a history of type 2 diabetes complicated by stage IV chronic kidney disease, hypertension, hyperlipidemia, tobacco abuse, and chronic low back pain who was recently discharged from the hospital for a multifocal pneumonia. He was admitted after episodes on hypoxia with home health OT with concern for acute on chronic HFpEF with hypoxemia on 2L [place] at home. However, medical team continuing to investigate underlying cause of his pulmonary status.  Clinical Impression   Patient received in bed, willing to participate in PT eval but becoming more and more agitated with therapist as he was asked to participate. He is able to complete functional bed mobility with S, however does require Min guard for functional transfers and gait in room with no device but is quite unsteady without UE assistance to help steady him; note he does use RW at baseline for mobility. Patient returned to bed with all needs met and questions/concerns addressed this morning, and may benefit from trial of skilled PT services during this hospital stay (pending participation) as well as HHPT upon return home.     Follow Up Recommendations Home health PT    Equipment Recommendations  None recommended by PT    Recommendations for Other Services       Precautions / Restrictions Precautions Precautions: Fall Restrictions Weight Bearing Restrictions: No      Mobility  Bed Mobility Overal bed mobility: Needs Assistance Bed Mobility: Supine to Sit;Sit to Supine     Supine to sit: Supervision Sit to supine: Supervision   General bed mobility comments: S for safety   Transfers Overall transfer level: Needs assistance Equipment used: None Transfers: Sit to/from Stand Sit to Stand: Min guard         General transfer comment: min guard for  safety, patient unsteady   Ambulation/Gait Ambulation/Gait assistance: Min guard Ambulation Distance (Feet): 15 Feet Assistive device: None Gait Pattern/deviations: Step-through pattern;Decreased step length - right;Decreased step length - left;Decreased dorsiflexion - right;Decreased dorsiflexion - left;Trendelenburg;Drifts right/left;Trunk flexed;Narrow base of support     General Gait Details: reaches for objects around him such as bed rails and counter due to unsteadiness (walker not available in room at eval), uses RW at baseilne however   Science writer    Modified Rankin (Stroke Patients Only)       Balance Overall balance assessment: Needs assistance Sitting-balance support: Bilateral upper extremity supported;Feet supported Sitting balance-Leahy Scale: Good     Standing balance support: Bilateral upper extremity supported;During functional activity Standing balance-Leahy Scale: Poor Standing balance comment: furniture/wall walks without RW                              Pertinent Vitals/Pain Pain Assessment: No/denies pain    Home Living Family/patient expects to be discharged to:: Private residence Living Arrangements: Other (Comment)(grandson? ) Available Help at Discharge: Family Type of Home: Apartment Home Access: Stairs to enter Entrance Stairs-Rails: Right(going up ) Technical brewer of Steps: 10 Home Layout: One level Home Equipment: Environmental consultant - 2 wheels Additional Comments: grandson lives with pt, but works    Prior Function Level of Independence: Independent with assistive device(s)               Hand Dominance  Extremity/Trunk Assessment   Upper Extremity Assessment Upper Extremity Assessment: Defer to OT evaluation    Lower Extremity Assessment Lower Extremity Assessment: Generalized weakness    Cervical / Trunk Assessment Cervical / Trunk Assessment: Normal  Communication    Communication: HOH  Cognition Arousal/Alertness: Awake/alert Behavior During Therapy: Agitated Overall Cognitive Status: No family/caregiver present to determine baseline cognitive functioning                                 General Comments: patient more awake today but agitated as he was encouraged to participate in tasks       General Comments General comments (skin integrity, edema, etc.): patient willing to participate in PT evaluation but becoming more agitated as session continues    Exercises     Assessment/Plan    PT Assessment Patient needs continued PT services  PT Problem List Decreased strength;Decreased mobility;Decreased safety awareness;Decreased coordination;Decreased balance       PT Treatment Interventions DME instruction;Therapeutic activities;Gait training;Therapeutic exercise;Patient/family education;Stair training;Balance training;Functional mobility training;Neuromuscular re-education    PT Goals (Current goals can be found in the Care Plan section)  Acute Rehab PT Goals Patient Stated Goal: feel better  PT Goal Formulation: With patient Time For Goal Achievement: 02/21/17 Potential to Achieve Goals: Good    Frequency Min 2X/week   Barriers to discharge        Co-evaluation               AM-PAC PT "6 Clicks" Daily Activity  Outcome Measure Difficulty turning over in bed (including adjusting bedclothes, sheets and blankets)?: None Difficulty moving from lying on back to sitting on the side of the bed? : None Difficulty sitting down on and standing up from a chair with arms (e.g., wheelchair, bedside commode, etc,.)?: None Help needed moving to and from a bed to chair (including a wheelchair)?: A Little Help needed walking in hospital room?: A Little Help needed climbing 3-5 steps with a railing? : A Little 6 Click Score: 21    End of Session Equipment Utilized During Treatment: Gait belt;Oxygen Activity Tolerance: Patient  tolerated treatment well Patient left: in bed;with call bell/phone within reach   PT Visit Diagnosis: Unsteadiness on feet (R26.81);Muscle weakness (generalized) (M62.81);Difficulty in walking, not elsewhere classified (R26.2);History of falling (Z91.81)    Time: 9476-5465 PT Time Calculation (min) (ACUTE ONLY): 10 min   Charges:   PT Evaluation $PT Eval Low Complexity: 1 Low     PT G Codes:        Deniece Ree PT, DPT, CBIS  Supplemental Physical Therapist [place]   Pager [phone]

## 2017-02-14 NOTE — Progress Notes (Signed)
1300 Patient has removed continuous pulse ox. He refuses to wear.

## 2017-02-14 NOTE — Progress Notes (Signed)
OT Cancellation Note  Patient Details Name: Ross Bauer MRN: [id number] DOB: 05/28/1945   Cancelled Treatment:    Reason Eval/Treat Not Completed: Patient declined, no reason specified. Pt adamantly declined participation in OT session today stating "I've already had it out with the doctor this morning and I don't need to have it out with you too." Max encouragement to participate provided and pt stating "ask me again and I will tell you again; I'm not going to do anything." Will continue attempts.   Doristine Sectionharity A Amariona Rathje, MS OTR/L  Pager: [phone]   Doristine SectionCharity A Zev Blue 02/14/2017, 2:24 PM

## 2017-02-14 NOTE — Progress Notes (Signed)
PT Cancellation Note  Patient Details Name: Ross Bauer MRN: [id number] DOB: 10/20/1945   Cancelled Treatment:    Reason Eval/Treat Not Completed: Patient declined, no reason specified Attempted to perform PT evaluation, patient finishing breakfast and agitated with PT. Plan to attempt to return later as/if schedule allows.    Nedra HaiKristen Unger PT, DPT, CBIS  Supplemental Physical Therapist [facility]   Pager [phone]

## 2017-02-14 NOTE — Progress Notes (Signed)
   Subjective:  Pt states he feels his breathing is doing alright today.  He is very upset today, wants to leave but also wants fluid off.  We told him we would ask cardiology to come by and see him today and he said they need to come "within the hour".    Objective:  Vital signs in last 24 hours: Vitals:   02/14/17 0156 02/14/17 0500 02/14/17 0615 02/14/17 0917  BP: 102/77  (!) 143/53   Pulse: (!) 105  94   Resp: 16  (!) 21   Temp: 99.1 F (37.3 C)  98.6 F (37 C)   TempSrc:      SpO2: 98%  94% 90%  Weight:  147 lb 0.8 oz (66.7 kg)    Height:       Physical Exam  Constitutional: He is oriented to person, place, and time. He appears well-developed and well-nourished.  Eyes: Right eye exhibits no discharge. Left eye exhibits no discharge. No scleral icterus.  Neck: JVD: would not allow me to examine today.  Cardiovascular: Normal rate, regular rhythm, normal heart sounds and intact distal pulses. Exam reveals no gallop and no friction rub.  No murmur heard. Pulmonary/Chest: Effort normal. No respiratory distress. He has no wheezes. He has no rales (much improved).  Abdominal: Soft. Bowel sounds are normal. He exhibits no distension and no mass. There is no tenderness. There is no guarding.  Musculoskeletal:       Right lower leg: He exhibits edema.       Left lower leg: He exhibits no edema.  Neurological: He is alert and oriented to person, place, and time.    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  Physical exam consistent with volume overload  -60mg  Lasix BID -home amlodipine started, PRN hydral added as well -strict I's and O's -fluid restriction 1200cc -continuous pulse ox -supplemental O2 as needed with weaning goals placed -requiring 4L O2 today  -lungs sound better today but weight up one pound,could not assess JVD today due to pts anger and frustration. Will likely need more diureses but kidney function is tenuous, albumin is low   T2DM  Poorly controlled last A1C 11.3 in December 2018  -SSI increased from thin to moderate today  Anemia of CKD Chronic, stable, on iron supplementation, weekly epo  -continue iron, epo   CKD IV Chronic stable baseline appears to be around 2.8, was 2.81 on admission  -stable so far continue to monitor bmp closely with diuresis and poor reserve  -spot urine protein/cr shows estimated 1.5g protein per day so not quite nephrotic range but clearly losing significant amount of protein.  Likely underestimated since pt is so thin   Phimosis  -contacted urology, will not come by and see pt unless completely obstructed -made outpatient appointment for 02/28/17 for circumcision  Dispo: Anticipated discharge in approximately 2-3 day(s).   Ross Bauer, Laakea Pereira B, MD 02/14/2017, 12:02 PM Thornell MuleBrandon Kynsley Whitehouse MD PGY-1 Internal Medicine Pager # [phone]

## 2017-02-14 NOTE — Progress Notes (Signed)
Internal Medicine Attending  Date: 02/14/2017  Patient name: Ross Bauer Medical record number: [id number] Date of birth: 06/13/1945 Age: 72 y.o. Gender: male  I saw and evaluated the patient. I reviewed the resident's note by Dr. Frances FurbishWinfrey and I agree with the resident's findings and plans as documented in his progress note.  Ross Bauer has been frustrated from the moment he was admitted and this has been manifested through a lack of corgiality with those trying to care for him. This has progressively worsened since the hospitalization, and this morning he was especially demanding. That said, we continue to diurese him with some subjective improvement symptomatically, as well as in his weight, albeit slower than we had hoped. I remain unsure if a lot of this represents volume overload or acute on chronic diastolic heart failure. We've asked cardiology to evaluate the patient for candidacy for right heart catheterization to get a better idea of his intravascular volume status as his kidney function remains tenuous. I am not sure how much longer Ross Bauer will allow us to try to diurese and further evaluate his underlying condition, and I am afraid his underlying unhapiness will result in his limiting what we can offer him. I'm hopeful this changes as I feel he is quite ill and we need to better define his underlying process in order to optimize his care and improve his symptomatology.

## 2017-02-14 NOTE — Care Management Important Message (Signed)
Important Message  Patient Details  Name: Virgel ManifoldJulius L Wiehe MRN: [id number] Date of Birth: 04/07/1945   Medicare Important Message Given:  No  Patient did not feel like signing? Unsign copy left in room   Brady Schiller 02/14/2017, 1:52 PM

## 2017-02-15 ENCOUNTER — Inpatient Hospital Stay (HOSPITAL_COMMUNITY): Payer: Medicare HMO

## 2017-02-15 DIAGNOSIS — R0602 Shortness of breath: Secondary | ICD-10-CM

## 2017-02-15 DIAGNOSIS — I5033 Acute on chronic diastolic (congestive) heart failure: Secondary | ICD-10-CM

## 2017-02-15 DIAGNOSIS — I10 Essential (primary) hypertension: Secondary | ICD-10-CM

## 2017-02-15 DIAGNOSIS — J9621 Acute and chronic respiratory failure with hypoxia: Secondary | ICD-10-CM

## 2017-02-15 LAB — BASIC METABOLIC PANEL
Anion gap: 9 (ref 5–15)
BUN: 32 mg/dL — ABNORMAL HIGH (ref 6–20)
CALCIUM: 8.1 mg/dL — AB (ref 8.9–10.3)
CO2: 23 mmol/L (ref 22–32)
CREATININE: 2.85 mg/dL — AB (ref 0.61–1.24)
Chloride: 102 mmol/L (ref 101–111)
GFR calc Af Amer: 24 mL/min — ABNORMAL LOW (ref >60)
GFR calc non Af Amer: 20 mL/min — ABNORMAL LOW (ref >60)
GLUCOSE: 121 mg/dL — AB (ref 65–99)
Potassium: 4 mmol/L (ref 3.5–5.1)
Sodium: 134 mmol/L — ABNORMAL LOW (ref 135–145)

## 2017-02-15 LAB — GLUCOSE, CAPILLARY
GLUCOSE-CAPILLARY: 136 mg/dL — AB (ref 65–99)
Glucose-Capillary: 162 mg/dL — ABNORMAL HIGH (ref 65–99)
Glucose-Capillary: 169 mg/dL — ABNORMAL HIGH (ref 65–99)
Glucose-Capillary: 139 mg/dL — ABNORMAL HIGH (ref 65–99)

## 2017-02-15 LAB — CULTURE, BLOOD (ROUTINE X 2)
Culture: NO GROWTH
Culture: NO GROWTH
SPECIAL REQUESTS: ADEQUATE
Special Requests: ADEQUATE

## 2017-02-15 MED ORDER — FUROSEMIDE 40 MG PO TABS
40.0000 mg | ORAL_TABLET | Freq: Two times a day (BID) | ORAL | Status: DC
  Administered 2017-02-15: 40 mg via ORAL
  Filled 2017-02-15 (×2): qty 1

## 2017-02-15 NOTE — Consult Note (Signed)
Cardiology Consultation:   Patient ID: [id number]; [id number]; August 13, 1945   Admit date: 02/10/2017 Date of Consult: 02/15/2017  Primary Care Provider: Doneen Poisson, MD Primary Cardiologist: Armanda Magic, MD new Primary Electrophysiologist: na   Patient Profile:   Ross Bauer is a 72 y.o. male with a hx of DN-2 CKD-stage 4, HTN, HLD, tobacco use and chronic low back pain with recent multifocal PNA who is being seen today for the evaluation of CHF in combination with worsening bilateral pulmonary infiltrates at the request of Dr. Josem Kaufmann.  History of Present Illness:   [name] has a hx of DN-2 CKD-stage 4, HTN, HLD, tobacco use and chronic low back pain with recent multifocal PNA who is being seen today for the evaluation of CHF in combination with worsening bilateral pulmonary infiltrates.  He was readmitted 02/10/17 after presenting with SOB and SP02 of 84% on 2L [place].   He was noted to have cardiomegaly mild interstitial edema, and sm. bil effusions Rt > Lt.   He was placed on lasix 40 BID, IV.  ABX given as well in ER.    WBC on admit 9.7, H/H 11.2/35.5 DDimer 3.74  BNP 135 Na 137, K+ 4.3, glucose 217, BUN 47 and Cr 2.81   Troponin poc 0.00  Stool neg for heme  EKG:  The EKG was personally reviewed and demonstrates:  SR early repol, no acute changes. Telemetry:  Not on tele.    Echo 01/06/18 with EF 55-60%, G1 DD, mild concentric hypertrophy, trivial MR,    Today he is + 574 with I&O , though wt is down from 156 to 147.  On lasix 60 mg BID.    Cr yesterday 2.86, K+ 4.5, Albumin 2.1,  He is on 4 L [place] with sp02 at 93-94 %.    He denies any chest pain.  He was SOB and is feeling better today.  He seems unhappy but does answer questions.    Past Medical History:  Diagnosis Date  . Acute on chronic diastolic (congestive) heart failure (HCC)   . Acute on chronic respiratory failure (HCC)   . Anemia in chronic kidney disease 06/19/2009  . Background diabetic retinopathy  associated with type 2 diabetes mellitus (HCC) 11/13/2013   Bilateral. Followed by ophthalmologist [name].  [name] BPH (benign prostatic hyperplasia) 09/30/2016  . Chronic back pain 05/09/2007   "all over" (01/02/2017)  . Erectile dysfunction associated with type 2 diabetes mellitus (HCC) 2001  . Essential hypertension   . Hyperlipidemia   . Onychomycosis of right great toe 06/01/2015  . Pneumonia 12/2016   CAP LLL 12/2016  . 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.  . Tobacco abuse 03/20/2007  . Type 2 diabetes mellitus with stage 4 chronic kidney disease (HCC)     Past Surgical History:  Procedure Laterality Date  . ESOPHAGOGASTRODUODENOSCOPY N/A 01/11/2017   Procedure: ESOPHAGOGASTRODUODENOSCOPY (EGD);  Surgeon: Beverley Fiedler, MD;  Location: [facility] ENDOSCOPY;  Service: Gastroenterology;  Laterality: N/A;  . NO PAST SURGERIES       Home Medications:  Prior to Admission medications   Medication Sig Start Date End Date Taking? Authorizing Provider  albuterol (PROVENTIL HFA;VENTOLIN HFA) 108 (90 Base) MCG/ACT inhaler Inhale 2 puffs into the lungs every 6 (six) hours as needed for wheezing or shortness of breath. 01/14/17  Yes Nedrud, Jeanella Flattery, MD  amLODipine (NORVASC) 10 MG tablet Take 1 tablet (10 mg total) daily  by mouth. 11/16/16  Yes Doneen PoissonKlima, Lawrence, MD  aspirin 81 MG tablet Take 1 tablet (81 mg total) by mouth daily. 04/30/13  Yes Emokpae, Ejiroghene E, MD  atorvastatin (LIPITOR) 80 MG tablet Take 1 tablet (80 mg total) by mouth daily at 6 PM. 03/04/16  Yes Doneen PoissonKlima, Lawrence, MD  Darbepoetin Alfa (ARANESP) 200 MCG/0.4ML SOSY injection Inject 0.4 mLs (200 mcg total) into the skin every Sunday at 6pm. 01/15/17  Yes Nedrud, Jeanella FlatteryMarybeth, MD  ferrous sulfate 325 (65 FE) MG tablet Take 1 tablet (325 mg total) by mouth daily with breakfast. 01/15/17  Yes Nedrud, Jeanella FlatteryMarybeth, MD  furosemide (LASIX) 40 MG tablet Take 1 tablet (40 mg  total) by mouth daily. 05/29/15  Yes Courtney ParisJones, Eden W, MD  glipiZIDE (GLUCOTROL) 10 MG tablet Take 1 tablet (10 mg total) by mouth 2 (two) times daily before a meal. 03/04/16  Yes Doneen PoissonKlima, Lawrence, MD  rosuvastatin (CRESTOR) 40 MG tablet Take 1 tablet (40 mg total) by mouth daily. 01/05/11 11/11/11  Priscella MannKalia-Reynolds, Maitri S, DO    Inpatient Medications: Scheduled Meds: . amLODipine  10 mg Oral Daily  . atorvastatin  80 mg Oral q1800  . enoxaparin (LOVENOX) injection  30 mg Subcutaneous Q24H  . ferrous sulfate  325 mg Oral Q breakfast  . insulin aspart  0-15 Units Subcutaneous TID WC  . insulin aspart  0-5 Units Subcutaneous QHS   Continuous Infusions:  PRN Meds: acetaminophen **OR** acetaminophen, albuterol, hydrALAZINE  Allergies:    Allergies  Allergen Reactions  . Ace Inhibitors     REACTION: intolerance    Social History:   Social History   Socioeconomic History  . Marital status: Divorced    Spouse name: Not on file  . Number of children: 2  . Years of education: Not on file  . Highest education level: Not on file  Social Needs  . Financial resource strain: Not on file  . Food insecurity - worry: Not on file  . Food insecurity - inability: Not on file  . Transportation needs - medical: Not on file  . Transportation needs - non-medical: Not on file  Occupational History  . 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 Smoking status: Former Smoker    Packs/day: 0.12    Years: 50.00    Pack years: 6.00    Types: Cigarettes    Last attempt to quit: 01/04/2017    Years since quitting: 0.1  . Smokeless tobacco: Never Used  Substance and Sexual Activity  . Alcohol use: No    Alcohol/week: 0.0 oz  . Drug use: No  . Sexual activity: No  Other Topics Concern  . Not on file  Social History Narrative   Limited finances for which he frequently skips medications for a month or two.            Family  History:    Family History  Problem Relation Age of Onset  . Cancer Mother        died in 7470s.  . Cancer Father        died in 4870s  . Healthy Sister   . Healthy Brother   . Unexplained death Brother   . Healthy Daughter   . Healthy Daughter      ROS:  Please see the history of present illness.  General:no colds or fevers, + weight loss here Skin:no rashes or ulcers HEENT:no blurred  vision, no congestion CV:see HPI PUL:see HPI GI:no diarrhea constipation or melena, no indigestion GU:no hematuria, no dysuria MS:no joint pain, no claudication Neuro:? syncope, no lightheadedness Endo:+ diabetes, no thyroid disease  All other ROS reviewed and negative.     Physical Exam/Data:   Vitals:   02/14/17 1355 02/14/17 2143 02/15/17 0506 02/15/17 0940  BP: (!) 127/45 127/60 129/68 (!) 135/56  Pulse: 90 95 98 96  Resp: 20 20 19    Temp: 98 F (36.7 C) 98.2 F (36.8 C) 97.9 F (36.6 C) 99 F (37.2 C)  TempSrc: Oral Oral Oral Oral  SpO2: 95% 95% 93% 94%  Weight:      Height:        Intake/Output Summary (Last 24 hours) at 02/15/2017 0951 Last data filed at 02/15/2017 0842 Gross per 24 hour  Intake 1140 ml  Output 775 ml  Net 365 ml   Filed Weights   02/12/17 0437 02/13/17 0540 02/14/17 0500  Weight: 144 lb 9.6 oz (65.6 kg) 146 lb 6.2 oz (66.4 kg) 147 lb 0.8 oz (66.7 kg)   Body mass index is 23.73 kg/m.  General:  Well nourished, well developed, in no acute distress, lying flat, he was able to sleep flat at home HEENT: normal Lymph: no adenopathy Neck: no JVD Endocrine:  No thryomegaly Vascular: No carotid bruits;  1+ pedal pulses  Cardiac:  normal S1, S2; RRR; no murmur gallup rub or click Lungs:  clear to auscultation bilaterally, no wheezing, rhonchi or rales  Abd: soft, nontender, no hepatomegaly  Ext: no edema Musculoskeletal:  No deformities, BUE and BLE strength normal and equal Skin: warm and dry  Neuro:  Alert and oriented, MAE follows commands, no focal  abnormalities noted Psych:  flat affect    Relevant CV Studies: Echo 01/06/17  Study Conclusions  - Left ventricle: The cavity size was normal. There was mild   concentric hypertrophy. Systolic function was normal. The   estimated ejection fraction was in the range of 55% to 60%. Wall   motion was normal; there were no regional wall motion   abnormalities. Doppler parameters are consistent with abnormal   left ventricular relaxation (grade 1 diastolic dysfunction).   There was no evidence of elevated ventricular filling pressure by   Doppler parameters. - Aortic valve: There was no regurgitation. - Aortic root: The aortic root was normal in size. - Mitral valve: There was trivial regurgitation. - Right ventricle: The cavity size was normal. Wall thickness was   normal. Systolic function was normal. - Right atrium: The atrium was normal in size. - Tricuspid valve: There was no regurgitation. - Pulmonary arteries: Systolic pressure could not be accurately   estimated. - Inferior vena cava: The vessel was normal in size. - Pericardium, extracardiac: There was no pericardial effusion.   Laboratory Data:  Chemistry Recent Labs  Lab 02/12/17 1532 02/13/17 0934 02/14/17 1049  NA 134* 135 135  K 3.9 3.6 4.5  CL 100* 100* 102  CO2 22 24 21*  GLUCOSE 106* 188* 247*  BUN 36* 38* 37*  CREATININE 2.84* 2.84* 2.86*  CALCIUM 7.9* 8.0* 8.0*  GFRNONAA 21* 21* 20*  GFRAA 24* 24* 24*  ANIONGAP 12 11 12     Recent Labs  Lab 02/14/17 1049  PROT 6.2*  ALBUMIN 2.1*  AST 26  ALT 19  ALKPHOS 111  BILITOT 0.4   Hematology Recent Labs  Lab 02/10/17 1620 02/11/17 0636  WBC 9.7 7.3  RBC 3.81* 3.32*  HGB 11.2*  9.7*  HCT 35.5* 30.9*  MCV 93.2 93.1  MCH 29.4 29.2  MCHC 31.5 31.4  RDW 15.2 15.2  PLT 385 315   Cardiac EnzymesNo results for input(s): TROPONINI in the last 168 hours.  Recent Labs  Lab 02/10/17 1626  TROPIPOC 0.00    BNP Recent Labs  Lab 02/10/17 1620    BNP 135.5*    DDimer  Recent Labs  Lab 02/10/17 1620  DDIMER 3.74*    Radiology/Studies:  Upper and lower ext venous dopplers neg for DVT  Assessment and Plan:   1. Acute on chronic diastolic HF wt has come down -? If accurate I&O.  On Lasix 60 mg BID  Dr. Mayford Knife to see.  Improved from fluid level.  ? Underlying lung issue with continued need for 4 L [place].  2.         No chest pain and neg troponin.  3.          Recent PNA and on prior CT of abd  01/05/17 moderate sized layering pl. Effusions and cystic lung disease at both lung bases  with a background pattern of ground-glass opacity, but superimposed irregular bilateral lower lobe consolidation. Early involvement of the lateral segment of the right middle lobe. Air bronchograms common no definite cavitary changes.  4.          Elevated d-dimer , unable to do VQ or CTA of chest so neg upper and lower ext dopplers for DVT.    5.           CKD-4 stable  6.           Anemia on iron  7.           HTN on amlodipine and prn hydralazine   8.            DM-2  Per IM     For questions or updates, please contact [facility] Please consult [email or web address] for contact info under Cardiology/STEMI.   Signed, Nada Boozer, NP  02/15/2017 9:51 AM

## 2017-02-15 NOTE — Progress Notes (Signed)
Internal Medicine Attending  Date: 02/15/2017  Patient name: Ross Bauer Medical record number: [id number] Date of birth: 12/19/1945 Age: 72 y.o. Gender: male  I saw and evaluated the patient. I reviewed the resident's note by Dr. Frances FurbishWinfrey and I agree with the resident's findings and plans as documented in his progress note.  When seen on rounds this morning he felt he could not tell whether his breathing was any better or not. Although he refused labs in the morning he was willing to let us try labs later in the day to make sure his renal function remains stable. Cardiology assessed the patient and did not feel that he had any evidence of volume overload. Although they were concerned about a multifocal pneumonia he clinically does not have this entity, at least with regards to a bacterial source. It is quite likely, he has an underlying pulmonary process that we have yet to name. We will therefore obtain a CT scan of the chest to assess the pulmonary parenchyma. Because we are not interested in the mediastinum we will be able to ovoid IV contrast. We will also place a pulmonary consult to get their input as to whether or not further evaluation is necessary for a pulmonary process. If we are able to nail down a definitive diagnosis as to the cause of his pulmonary infiltrates and hypoxemia we may be in a better position to address the pathophysiology and correct it. We appreciate both Cardiology's input and opinion as well as the future input we will get from Pulmonary later today.

## 2017-02-15 NOTE — Progress Notes (Signed)
Lab in to draw Pt's AM labs, Pt refused lab draw.  Lab indicated he will try again later today.

## 2017-02-15 NOTE — Progress Notes (Signed)
   Subjective:    Pt refused labs this am.  Still frustrated, can't tell if he is any less short of breath.   Objective:  Vital signs in last 24 hours: Vitals:   02/14/17 1355 02/14/17 2143 02/15/17 0506 02/15/17 0940  BP: (!) 127/45 127/60 129/68 (!) 135/56  Pulse: 90 95 98 96  Resp: 20 20 19    Temp: 98 F (36.7 C) 98.2 F (36.8 C) 97.9 F (36.6 C) 99 F (37.2 C)  TempSrc: Oral Oral Oral Oral  SpO2: 95% 95% 93% 94%  Weight:      Height:       Physical Exam  Constitutional: He is oriented to person, place, and time. He appears well-developed and well-nourished.  Eyes: Right eye exhibits no discharge. Left eye exhibits no discharge. No scleral icterus.  Neck: JVD (mild 7cm H20) present.  Cardiovascular: Normal rate, regular rhythm, normal heart sounds and intact distal pulses. Exam reveals no gallop and no friction rub.  No murmur heard. Pulmonary/Chest: Effort normal. No respiratory distress. He has no wheezes. He has no rales (much improved).  Abdominal: Soft. Bowel sounds are normal. He exhibits no distension and no mass. There is no tenderness. There is no guarding.  Musculoskeletal:       Right lower leg: He exhibits edema.       Left lower leg: He exhibits no edema.  Neurological: He is alert and oriented to person, place, and time.    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  Physical exam consistent with volume overload  -hold lasix as pt refused labs  today, may agree to them later and can adjust -home amlodipine started, PRN hydral added as well -strict I's and O's -fluid restriction 1200cc -continuous pulse ox pt refused -supplemental O2 as needed with weaning goals placed -requiring 4L [place] today  -no weight today, likely refused -cardiology evaluated pt felt him to be euvolemic recommended pulm consult -will begin with non contrast CT if pt allows and consider pulm consult based on results   T2DM  Poorly controlled last A1C 11.3 in December 2018  -SSI increased from thin to moderate   Anemia of CKD Chronic, stable, on iron supplementation, weekly epo  -continue iron, epo   CKD IV Chronic stable baseline appears to be around 2.8, was 2.81 on admission  -stable so far continue to monitor bmp closely with diuresis and poor reserve  -spot urine protein/cr shows estimated 1.5g protein per day so not quite nephrotic range but clearly losing significant amount of protein.  Likely underestimated since pt is so thin   Phimosis  -contacted urology, will not come by and see pt unless completely obstructed -made outpatient appointment for 02/28/17 for circumcision  Dispo: Anticipated discharge in approximately 2-3 day(s).   [name], Jin Shockley B, MD 02/15/2017, 10:42 AM Thornell MuleBrandon Jaylynn Siefert MD PGY-1 Internal Medicine Pager # [phone]

## 2017-02-15 NOTE — Consult Note (Signed)
Name: Ross Bauer MRN: [id number] DOB: [birth year]    ADMISSION DATE:  02/10/2017 CONSULTATION DATE:  02/15/2017  REFERRING MD :  Dr. Josem Kaufmann  CHIEF COMPLAINT:  Shortness of breath  HISTORY OF PRESENT ILLNESS:   HPI obtained from medical chart review as limited information obtained from patient secondary to poor cooperation and/or historian.  72 year old male with PMH as below significant for but not limited to tobacco use (quit 2 months ago), HTN, diastolic heart failure, CKD stage 4, HLD, DMT2, and chronic low back pain admitted to [facility]MT[facility] on 2/1 presenting with ongoing hypoxia and progressive dyspnea.  Patient recently hospitalized from 12/24 to1/7 and treated with multifocal pneumonia and sent home on 2L O2.  Home health found him hypoxic in the the 80's on his 2L and sent him to the ER. CXR showed worsening bilateral pulmonary infiltrates. Denied fever, cough, or chills but reports new bilateral lower leg swelling. His BNP was mildly elevated on admit. He did not have fever, or leukocytosis on admit.  He was felt to be hypervolemic and was diuresed for presumed acute on chronic diastolic heart failure with minimal improvement in dyspnea or O2 requirements.  Cardiology was consulted on 2/6 and felt that he was euvolemic, with stable weights, and pulmonary changes due to underlying undiagnosed lung disease or multifocal pneumonia.  Therefore Pulmonary consulted for further input.  Non-contrast chest CT ordered for later today.  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  has a past surgical history that includes No past surgeries and Esophagogastroduodenoscopy (N/A, 01/11/2017). Prior to Admission medications   Medication Sig Start Date End Date Taking? Authorizing Provider  albuterol (PROVENTIL HFA;VENTOLIN HFA) 108 (90 Base) MCG/ACT inhaler Inhale 2 puffs into the lungs every 6 (six) hours as needed for wheezing or shortness of breath. 01/14/17  Yes Nedrud, Jeanella Flattery, MD  amLODipine (NORVASC) 10 MG tablet Take 1 tablet (10 mg total) daily by mouth. 11/16/16  Yes Doneen Poisson, MD  aspirin 81 MG tablet Take 1 tablet (81 mg total) by mouth daily. 04/30/13  Yes Emokpae, Ejiroghene E, MD  atorvastatin (LIPITOR) 80 MG tablet Take 1 tablet (80 mg total) by mouth daily at 6 PM. 03/04/16  Yes Doneen Poisson, MD  Darbepoetin Alfa (ARANESP) 200 MCG/0.4ML SOSY injection Inject 0.4 mLs (200 mcg total) into the skin every Sunday at 6pm. 01/15/17  Yes Nedrud, Jeanella Flattery, MD  ferrous sulfate 325 (65 FE) MG tablet Take 1 tablet (325 mg total) by mouth daily with breakfast. 01/15/17  Yes Nedrud, Jeanella Flattery, MD  furosemide (LASIX) 40 MG tablet Take 1 tablet (40 mg total) by mouth daily. 05/29/15  Yes Courtney Paris, MD  glipiZIDE (GLUCOTROL) 10 MG tablet Take 1 tablet (10 mg total) by mouth 2 (two) times daily before a meal. 03/04/16  Yes Doneen Poisson, MD  rosuvastatin (CRESTOR) 40 MG tablet Take 1 tablet (40 mg total) by mouth daily. 01/05/11 11/11/11  Priscella Mann, DO  Allergies  Allergen Reactions  . Ace Inhibitors     REACTION: intolerance    FAMILY HISTORY:  family history includes Cancer in his father and mother; Healthy in his brother, daughter, daughter, and sister; Unexplained death in his brother. SOCIAL  HISTORY:  reports that he quit smoking about 6 weeks ago. His smoking use included cigarettes. He has a 6.00 pack-year smoking history. he has never used smokeless tobacco. He reports that he does not drink alcohol or use drugs.  REVIEW OF SYSTEMS:   Unable to fully complete due to limited patient cooperation/participation.   SUBJECTIVE:  Denies SOB.   Patient refused morning lab work, refuses to wear pulse ox monitoring  VITAL SIGNS: Temp:  [97.9 F (36.6 C)-99 F (37.2 C)] 98.4 F (36.9 C) (02/06 1438) Pulse Rate:  [86-98] 86 (02/06 1438) Resp:  [19-20] 19 (02/06 0506) BP: (126-135)/(56-68) 126/57 (02/06 1438) SpO2:  [89 %-98 %] 98 % (02/06 1601)  PHYSICAL EXAMINATION: General:  Elderly male lying flat in bed, sleeping, in no acute distress PSY:  Mildly irritated Neuro: Easily awakens, seems appropriate/ oriented, MAE CV: rrr  PULM: even/non-labored on 4L [place], lungs bilaterally clear, faint rales in bilateral bases Extremities: warm/dry, trace BLE edema  Skin: no rashes    Recent Labs  Lab 02/13/17 0934 02/14/17 1049 02/15/17 1406  NA 135 135 134*  K 3.6 4.5 4.0  CL 100* 102 102  CO2 24 21* 23  BUN 38* 37* 32*  CREATININE 2.84* 2.86* 2.85*  GLUCOSE 188* 247* 121*   Recent Labs  Lab 02/10/17 1620 02/11/17 0636  HGB 11.2* 9.7*  HCT 35.5* 30.9*  WBC 9.7 7.3  PLT 385 315   No results found.   STUDIES:  CT Chest 2/6 >>  ASSESSMENT / PLAN:   Acute on chronic hypoxic respiratory failure- -likely multifactorial secondary to acute on chronic diastolic heart failure in the setting of stage IV CKD - weights seem inaccurate, mildly elevated diastolic HF, BLE edema,  pleural effusions and blebs noted on CT abd 12/2016 with given his smoking history likely has underlying emphysema, ? Non-compliance at home, less likely infectious process given the lack of fever, productive cough, leukocytosis.     P:  Await results of non-contrast Chest CT Wean supplemental O2 for  goal saturations of 88-92% Consider nephrology consult to help further delineate fluid status/ diuresis goals as patient is stage IV CKD Continue prn BD   Posey BoyerBrooke Simpson, AGACNP-BC [facility] Pgr: [phone] or if no answer [phone] 02/15/2017, 5:16 PM  Attending:  I have seen and examined the patient with nurse practitioner/resident and agree with the note above.  We formulated the plan together and I elicited the following history.    Subjective: Shortness of breath  Objective: Vitals:   02/15/17 1438 02/15/17 1601 02/15/17 2023 02/16/17 0455  BP: (!) 126/57  (!) 141/51 (!) 151/60  Pulse: 86  91 97  Resp:   20 19  Temp: 98.4 F (36.9 C)  98.6 F (37 C) 98.5 F (36.9 C)  TempSrc: Oral  Oral Oral  SpO2: 94% 98% 93% 98%  Weight:    66.4 kg (146 lb 6.2 oz)  Height:          Intake/Output Summary (Last 24 hours) at 02/16/2017 0735 Last data filed at 02/16/2017 [id number] Gross per 24 hour  Intake 1460 ml  Output 900 ml  Net 560 ml    General: awake and alert. Does not wish to answer  questions. HEENT: no jvd or cyanosis. CHEST: decreased breath sounds at the bases. No crackles, wheezes or rhonchi. COR: s1s2. No gallop or rub. No murmur Abd: Non tender. No organomegaly. Ext: + pedal edema. No cyanosis or clubbing.  CBC    Component Value Date/Time   WBC 7.3 02/11/2017 0636   RBC 3.32 (L) 02/11/2017 0636   HGB 9.7 (L) 02/11/2017 0636   HCT 30.9 (L) 02/11/2017 0636   PLT 315 02/11/2017 0636   MCV 93.1 02/11/2017 0636   MCH 29.2 02/11/2017 0636   MCHC 31.4 02/11/2017 0636   RDW 15.2 02/11/2017 0636   LYMPHSABS 1.8 02/10/2017 1620   MONOABS 0.5 02/10/2017 1620   EOSABS 0.1 02/10/2017 1620   BASOSABS 0.0 02/10/2017 1620    BMET    Component Value Date/Time   NA 134 (L) 02/15/2017 1406   NA 139 05/29/2015 1402   K 4.0 02/15/2017 1406   CL 102 02/15/2017 1406   CO2 23 02/15/2017 1406   GLUCOSE 121 (H) 02/15/2017 1406   BUN 32 (H) 02/15/2017  1406   BUN 36 (H) 05/29/2015 1402   CREATININE 2.85 (H) 02/15/2017 1406   CREATININE 2.69 (H) 03/26/2014 0913   CALCIUM 8.1 (L) 02/15/2017 1406   GFRNONAA 20 (L) 02/15/2017 1406   GFRNONAA 23 (L) 03/26/2014 0913   GFRAA 24 (L) 02/15/2017 1406   GFRAA 27 (L) 03/26/2014 0913    CXR images  CT CHEST WO CONTRAST  Final Result    VAS [place] LOWER EXTREMITY VENOUS (DVT)  Final Result    UE Venous Duplex ([facility] and [facility] ONLY)  Final Result    DG Chest Portable 1 View  Final Result       Impression/Plan: 1. Dyspnea 2. Emphysema, probable copd 3. Tobacco use 4. CHF with effusions  1. Fluid management with the assistance of nephrology. 2. Patient on oxygen at home. Suggests increase to 4 liters/min likely due to acute process (chf). Doubt infectious etiology as cxr findings are present one month ago. 3. Await ct chest.  My cc time 35 minutes  Elayne Snare, MD [facility] Pager: [phone]

## 2017-02-15 NOTE — Progress Notes (Cosign Needed)
Pt o2 92% on 4 L [place]. He was decreased to 3 L for a hour and O2 dropped to 89%. Pt is now back on 4L [place], will reassess.

## 2017-02-15 NOTE — Progress Notes (Addendum)
OT Cancellation Note  Patient Details Name: Ross ManifoldJulius L Howdeshell MRN: [id number] DOB: 01/06/1946   Cancelled Treatment:    Reason Eval/Treat Not Completed: Patient declined, no reason specified. Pt continued to decline participation in OT session today despite maximum encouragement and education concerning the benefits of participation with therapies. Discussed and pt reports he does want to continue to have OT services while admitted. Will continue attempts.   Doristine Sectionharity A Marlita Keil, MS OTR/L  Pager: [phone]   Pellegrino Kennard A Nadra Hritz 02/15/2017, 2:18 PM

## 2017-02-16 DIAGNOSIS — J438 Other emphysema: Secondary | ICD-10-CM

## 2017-02-16 DIAGNOSIS — J439 Emphysema, unspecified: Secondary | ICD-10-CM

## 2017-02-16 DIAGNOSIS — R809 Proteinuria, unspecified: Secondary | ICD-10-CM

## 2017-02-16 LAB — BASIC METABOLIC PANEL
ANION GAP: 9 (ref 5–15)
BUN: 34 mg/dL — ABNORMAL HIGH (ref 6–20)
CALCIUM: 7.9 mg/dL — AB (ref 8.9–10.3)
CO2: 26 mmol/L (ref 22–32)
Chloride: 100 mmol/L — ABNORMAL LOW (ref 101–111)
Creatinine, Ser: 2.99 mg/dL — ABNORMAL HIGH (ref 0.61–1.24)
GFR calc Af Amer: 22 mL/min — ABNORMAL LOW (ref >60)
GFR, EST NON AFRICAN AMERICAN: 19 mL/min — AB (ref >60)
Glucose, Bld: 183 mg/dL — ABNORMAL HIGH (ref 65–99)
POTASSIUM: 4.2 mmol/L (ref 3.5–5.1)
SODIUM: 135 mmol/L (ref 135–145)

## 2017-02-16 LAB — GLUCOSE, CAPILLARY
GLUCOSE-CAPILLARY: 121 mg/dL — AB (ref 65–99)
GLUCOSE-CAPILLARY: 109 mg/dL — AB (ref 65–99)
GLUCOSE-CAPILLARY: 172 mg/dL — AB (ref 65–99)
Glucose-Capillary: 146 mg/dL — ABNORMAL HIGH (ref 65–99)

## 2017-02-16 MED ORDER — FUROSEMIDE 10 MG/ML IJ SOLN
80.0000 mg | Freq: Two times a day (BID) | INTRAMUSCULAR | Status: DC
  Administered 2017-02-16: 80 mg via INTRAVENOUS
  Filled 2017-02-16: qty 8

## 2017-02-16 MED ORDER — NEPRO/CARBSTEADY PO LIQD
237.0000 mL | Freq: Three times a day (TID) | ORAL | Status: DC
  Administered 2017-02-16 – 2017-02-18 (×2): 237 mL via ORAL
  Filled 2017-02-16 (×10): qty 237

## 2017-02-16 MED ORDER — DARBEPOETIN ALFA 200 MCG/0.4ML IJ SOSY
200.0000 ug | PREFILLED_SYRINGE | Freq: Once | INTRAMUSCULAR | Status: AC
  Administered 2017-02-17: 200 ug via SUBCUTANEOUS
  Filled 2017-02-16 (×2): qty 0.4

## 2017-02-16 MED ORDER — METOPROLOL TARTRATE 5 MG/5ML IV SOLN: 5.0000 mg | Freq: Once | INTRAVENOUS | Status: DC

## 2017-02-16 MED ORDER — FUROSEMIDE 20 MG PO TABS
20.0000 mg | ORAL_TABLET | Freq: Every day | ORAL | Status: DC
  Administered 2017-02-16: 20 mg via ORAL
  Filled 2017-02-16 (×2): qty 1

## 2017-02-16 NOTE — Progress Notes (Signed)
   Subjective:  Today we talked to Ross Bauer about his current clinical status.  He still does not know if he feels any better or worse.  Does not feel short of breath as long as he has the oxygen going.  No chest pain, still no cough or fevers.     Objective:  Vital signs in last 24 hours: Vitals:   02/15/17 1601 02/15/17 2023 02/16/17 0455 02/16/17 1454  BP:  (!) 141/51 (!) 151/60 (!) 145/89  Pulse:  91 97 88  Resp:  20 19 16   Temp:  98.6 F (37 C) 98.5 F (36.9 C) 97.9 F (36.6 C)  TempSrc:  Oral Oral Oral  SpO2: 98% 93% 98% 94%  Weight:   146 lb 6.2 oz (66.4 kg)   Height:       Physical Exam  Constitutional: He is oriented to person, place, and time. He appears well-developed and well-nourished.  Eyes: Right eye exhibits no discharge. Left eye exhibits no discharge. No scleral icterus.  Neck: JVD present.  Cardiovascular: Normal rate, regular rhythm, normal heart sounds and intact distal pulses. Exam reveals no gallop and no friction rub.  No murmur heard. Pulmonary/Chest: Effort normal. No respiratory distress. He has no wheezes. He has rales (worse today) in the right lower field and the left lower field.  Abdominal: Soft. Bowel sounds are normal. He exhibits no distension and no mass. There is no tenderness. There is no guarding.  Musculoskeletal:       Right lower leg: He exhibits edema.       Left lower leg: He exhibits no edema.  Neurological: He is alert and oriented to person, place, and time.    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   -Lasix 80mg  BID -home amlodipine started, PRN hydral added as well -strict I's and O's -fluid restriction 1200cc -continuous pulse ox- pt refused -supplemental O2 as needed with weaning goals placed -requiring 4L O2 today  -weights inaccurate    T2DM  Poorly controlled last A1C 11.3 in December 2018  -SSI increased from thin to moderate   Anemia of CKD Chronic, stable, on iron supplementation, weekly epo  -continue iron, epo   CKD IV w/proteinuria Chronic stable baseline appears to be around 2.8, was 2.81 on admission  -stable so far continue to monitor bmp closely with diuresis and poor reserve  -spot urine protein/cr shows estimated 1.5g protein per day so not quite nephrotic range but clearly losing significant amount of protein.  Likely underestimated since pt is so thin   Phimosis  -contacted urology, will not come by and see pt unless completely obstructed -made outpatient appointment for 02/28/17 for circumcision  Dispo: Anticipated discharge in approximately 2-3 day(s).   Ross Bauer, Ross Contino B, MD 02/16/2017, 5:56 PM Ross MuleBrandon Zharia Conrow MD PGY-1 Internal Medicine Pager # [phone]

## 2017-02-16 NOTE — Progress Notes (Signed)
Initial Nutrition Assessment  DOCUMENTATION CODES:   Not applicable  INTERVENTION:   -Nepro Shake po TID, each supplement provides 425 kcal and 19 grams protein  NUTRITION DIAGNOSIS:   Inadequate oral intake related to decreased appetite as evidenced by meal completion < 50%.  GOAL:   Patient will meet greater than or equal to 90% of their needs  MONITOR:   PO intake, Supplement acceptance, Labs, Weight trends, Skin, I & O's  REASON FOR ASSESSMENT:   Consult Assessment of nutrition requirement/status  ASSESSMENT:   Ross Bauer is a 72 year old man with a history of type 2 diabetes complicated by stage IV chronic kidney disease, hypertension, hyperlipidemia, tobacco abuse, and chronic low back pain who was recently discharged from the hospital for a multifocal pneumonia.  Pt admitted with acute on chronic CHF.   Pt agitated at time of visit; minimally interactive with this RD. When RD asked pt how he was feeling, pt simply stated "no, I'm not feeling better". RD attempted to elicit information about nutrition history, which pt responded "that breakfast was kicking"; when RD attempted to seek clarification, pt requested this RD to turn off the liqht and exit the room.   Intake has decreased significantly over the past 1-2 days noted 0-50% meal completion, versus previously 50-100% meal completion.   Unable to complete Nutrition-Focused physical exam at this time. Suspect malnutrition given previous hx. Pt appeared frail at time of visit, however, unable to visualize fat and muscle depletion due to pt's thick clothing.  Reviewed wt hx; UBW around 160#. Noted pt has experienced a 7% wt loss over the past year, which is not significant for time frame.   Case discussed with RN, who reports pt has been minimally interactive with her as well. She confirms that PO intake has been very low.   Last Hgb A1c: 11.3 (01/02/17). Per home medications list, home DM medication regimen is 10  mg glipizide BID  Labs reviewed: Na: 134, CBGS: 139-169 (inpatient orders for glycemic control are 0-15 units insulin aspart TID with melas and 0-5 units insulin aspart q HS). .  Diet Order:  Diet renal/carb modified with fluid restriction Diet-HS Snack? Nothing; Fluid restriction: 1200 mL Fluid; Room service appropriate? Yes; Fluid consistency: Thin  EDUCATION NEEDS:   Not appropriate for education at this time  Skin:  Skin Assessment: Reviewed RN Assessment  Last BM:  02/14/17  Height:   Ht Readings from Last 1 Encounters:  02/10/17 5\' 6"  (1.676 m)    Weight:   Wt Readings from Last 1 Encounters:  02/16/17 146 lb 6.2 oz (66.4 kg)    Ideal Body Weight:  64.5 kg  BMI:  Body mass index is 23.63 kg/m.  Estimated Nutritional Needs:   Kcal:  1800-2000  Protein:  95-110 grams  Fluid:  per MD    Kebin Maye A. Mayford KnifeWilliams, RD, LDN, CDE Pager: [phone] After hours Pager: [phone]

## 2017-02-16 NOTE — Progress Notes (Signed)
Internal Medicine Attending  Date: 02/16/2017  Patient name: Virgel ManifoldJulius L Tal Medical record number: [id number] Date of birth: 01/08/1946 Age: 72 y.o. Gender: male  I saw and evaluated the patient. I reviewed the resident's note by Dr. Frances FurbishWinfrey and I agree with the resident's findings and plans as documented in his progress note.  When seen on rounds this morning [name] remains his own worst enemy reticent to productively engage with his healthcare providers. The CT scan that was obtained yesterday is notable for significant paraseptal emphysema as well as large bilateral effusions. Thus, I feel that some of his hypoxemia is related to underlying parenchymal lung disease, specifically paraseptal emphysema. That said, I think he has considerable volume overload as well. This is likely related to diastolic heart failure which has been difficult to manage in the setting of stage IV chronic kidney disease. To make things more challenging [name] has refused his labs from this morning. I'm hesitant to blindly diuresis him aggressively without being able to follow his renal function given the tenuous status he is currently in. Unfortunately, I do believe it is time to have a discussion about palliative or hospice care with [name]. I will try to find time tomorrow to have this discussion so I can understand what his goals are and move in that direction. I am hopeful he will productively engage in that conversation given its importance for him.

## 2017-02-16 NOTE — Consult Note (Signed)
[facility] Consult Note     Date: 02/16/2017                  Patient Name:  Ross Bauer  MRN: [id number]  DOB: 1945-07-11  Age / Sex: 72 y.o., male         PCP: Oval Linsey, MD                 Service Requesting Consult: Internal Medicine                 Reason for Consult: Hypervolemia            Chief Complaint: Dyspnea and worsening hypoxia HPI:  72 year old male with PMH notable for CKD IV, HLD, T2DM, who presented to hospital with hypoxia and dyspnea. He was recently hospitalized for pneumonia  and discharged 1/7 on 2L home O2. He then had persistent hypoxia warranting readmission. Initially thought to have acute on chronic diastolic heart failure currently on 80 mg IV Lasix. Cardiology assessed the patient and felt he was euvolemic, however there was concern for multifocal pneumonia and Pulmonology was subsequently consulted for worsening hypoxemia.  When I was in the room patient was not agreeable to answering many questions. He feels breathing is OK or about the same as it has been throughout this admission, and expresses that he really wants to go home. He feels he may have some extra fluid on, endorsing some LE edema. He is agreeable to speaking with palliative care and potentially having home hospice down the road.   Past Medical History:  Diagnosis Date  . Acute on chronic diastolic (congestive) heart failure ([place])   . Acute on chronic respiratory failure ([place])   . Anemia in chronic kidney disease 06/19/2009  . Background diabetic retinopathy associated with type 2 diabetes mellitus ([place]) 11/13/2013   Bilateral. Followed by ophthalmologist Dr. Clent Jacks.  [name] BPH (benign prostatic hyperplasia) 09/30/2016  . Chronic back pain 05/09/2007   "all over" (01/02/2017)  . Erectile dysfunction associated with type 2 diabetes mellitus ([place]) 2001  . Essential hypertension   . Hyperlipidemia   . Onychomycosis of right great toe 06/01/2015  . Pneumonia  12/2016   CAP LLL 12/2016  . 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 Florene Glen.  . Tobacco abuse 03/20/2007  . Type 2 diabetes mellitus with stage 4 chronic kidney disease ([place])     Past Surgical History:  Procedure Laterality Date  . ESOPHAGOGASTRODUODENOSCOPY N/A 01/11/2017   Procedure: ESOPHAGOGASTRODUODENOSCOPY (EGD);  Surgeon: Jerene Bears, MD;  Location: [facility] ENDOSCOPY;  Service: Gastroenterology;  Laterality: N/A;  . NO PAST SURGERIES      Family History  Problem Relation Age of Onset  . Cancer Mother        died in 20s.  . Cancer Father        died in 70s  . Healthy Sister   . Healthy Brother   . Unexplained death Brother   . Healthy Daughter   . Healthy Daughter    Social History:  reports that he quit smoking about 6 weeks ago. His smoking use included cigarettes. He has a 6.00 pack-year smoking history. he has never used smokeless tobacco. He reports that he does not drink alcohol or use drugs.  Allergies:  Allergies  Allergen Reactions  . Ace Inhibitors     REACTION: intolerance    Medications Prior to Admission  Medication Sig  Dispense Refill  . albuterol (PROVENTIL HFA;VENTOLIN HFA) 108 (90 Base) MCG/ACT inhaler Inhale 2 puffs into the lungs every 6 (six) hours as needed for wheezing or shortness of breath. 1 Inhaler 2  . amLODipine (NORVASC) 10 MG tablet Take 1 tablet (10 mg total) daily by mouth. 90 tablet 3  . aspirin 81 MG tablet Take 1 tablet (81 mg total) by mouth daily. 90 tablet 3  . atorvastatin (LIPITOR) 80 MG tablet Take 1 tablet (80 mg total) by mouth daily at 6 PM. 90 tablet 3  . Darbepoetin Alfa (ARANESP) 200 MCG/0.4ML SOSY injection Inject 0.4 mLs (200 mcg total) into the skin every Sunday at 6pm. 1.68 mL 0  . ferrous sulfate 325 (65 FE) MG tablet Take 1 tablet (325 mg total) by mouth daily with breakfast. 30 tablet 0  . furosemide (LASIX) 40 MG tablet Take 1 tablet (40 mg total) by  mouth daily. 90 tablet 1  . glipiZIDE (GLUCOTROL) 10 MG tablet Take 1 tablet (10 mg total) by mouth 2 (two) times daily before a meal. 180 tablet 3    Results for orders placed or performed during the hospital encounter of 02/10/17 (from the past 48 hour(s))  Glucose, capillary     Status: None   Collection Time: 02/14/17  4:50 PM  Result Value Ref Range   Glucose-Capillary 99 65 - 99 mg/dL   Comment 1 Notify RN   Glucose, capillary     Status: Abnormal   Collection Time: 02/14/17  9:42 PM  Result Value Ref Range   Glucose-Capillary 164 (H) 65 - 99 mg/dL  Glucose, capillary     Status: Abnormal   Collection Time: 02/15/17  7:56 AM  Result Value Ref Range   Glucose-Capillary 136 (H) 65 - 99 mg/dL  Glucose, capillary     Status: Abnormal   Collection Time: 02/15/17 11:55 AM  Result Value Ref Range   Glucose-Capillary 162 (H) 65 - 99 mg/dL  Basic metabolic panel     Status: Abnormal   Collection Time: 02/15/17  2:06 PM  Result Value Ref Range   Sodium 134 (L) 135 - 145 mmol/L   Potassium 4.0 3.5 - 5.1 mmol/L   Chloride 102 101 - 111 mmol/L   CO2 23 22 - 32 mmol/L   Glucose, Bld 121 (H) 65 - 99 mg/dL   BUN 32 (H) 6 - 20 mg/dL   Creatinine, Ser 2.85 (H) 0.61 - 1.24 mg/dL   Calcium 8.1 (L) 8.9 - 10.3 mg/dL   GFR calc non Af Amer 20 (L) >60 mL/min   GFR calc Af Amer 24 (L) >60 mL/min    Comment: (NOTE) The eGFR has been calculated using the CKD EPI equation. This calculation has not been validated in all clinical situations. eGFR's persistently <60 mL/min signify possible Chronic Kidney Disease.    Anion gap 9 5 - 15    Comment: Performed at [facility] [address] [place], [place] [address]  Glucose, capillary     Status: Abnormal   Collection Time: 02/15/17  7:02 PM  Result Value Ref Range   Glucose-Capillary 169 (H) 65 - 99 mg/dL  Glucose, capillary     Status: Abnormal   Collection Time: 02/15/17  8:23 PM  Result Value Ref Range   Glucose-Capillary 139 (H)  65 - 99 mg/dL  Glucose, capillary     Status: Abnormal   Collection Time: 02/16/17  7:35 AM  Result Value Ref Range   Glucose-Capillary 146 (H)  65 - 99 mg/dL  Glucose, capillary     Status: Abnormal   Collection Time: 02/16/17 11:51 AM  Result Value Ref Range   Glucose-Capillary 121 (H) 65 - 99 mg/dL   Ct Chest Wo Contrast  Result Date: 02/15/2017 CLINICAL DATA:  Hypoxemia, history hypertension, chronic kidney disease, type II diabetes mellitus, former smoker, CHF EXAM: CT CHEST WITHOUT CONTRAST TECHNIQUE: Multidetector CT imaging of the chest was performed following the standard protocol without IV contrast. Sagittal and coronal MPR images reconstructed from axial data set. COMPARISON:  Chest radiograph 02/10/2017 FINDINGS: Cardiovascular: Atherosclerotic calcifications of aorta, proximal great vessels and coronary arteries. Small pericardial effusion. Heart normal size. Aorta normal caliber. Mediastinum/Nodes: Scattered normal size mediastinal lymph nodes with additional mildly enlarged lymph nodes prevascular 12 mm short axis image 46 and at AP window 11 mm short axis image 60. Esophagus grossly unremarkable. Base of cervical region normal appearance. No axillary adenopathy. Lungs/Pleura: Moderate BILATERAL pleural effusions. Severe emphysematous changes greatest in upper lobes. Significant atelectasis in BILATERAL lower lobes and in the posterior aspect of the LEFT upper lobe. Partial atelectasis RIGHT middle lobe. No definite pulmonary infiltrate or mass. No pneumothorax. Upper Abdomen: Unremarkable Musculoskeletal: No acute osseous findings. IMPRESSION: Significant emphysematous changes with moderate BILATERAL pleural effusions and significant compressive atelectasis of the lungs. Extensive atherosclerotic disease including coronary arteries with small pericardial effusion. Few nonspecific minimally enlarged mediastinal lymph nodes. Remainder of exam unremarkable. Aortic Atherosclerosis  (ICD10-I70.0) and Emphysema (ICD10-J43.9). Electronically Signed   By: Lavonia Dana M.D.   On: 02/15/2017 18:48    Review of Systems  Eyes: Positive for double vision.    Blood pressure (!) 151/60, pulse 97, temperature 98.5 F (36.9 C), temperature source Oral, resp. rate 19, height '5\' 6"'$  (1.676 m), weight 146 lb 6.2 oz (66.4 kg), SpO2 98 %. Physical Exam  GEN: Patient sleeps comfortably when we enter the room, NAD, O2 by [place] in place RESPIRATORY: poor effort, comfortable work of breathing, scattered crackles appreciated throughout CV: RRR, no m/r/g, 0-1+ LE edema bil GI: Soft, non-tender SKIN: warm and dry, no rashes or lesions PSYCH: AAOx3  Assessment/Plan 1. CKD IV - patient is near baseline renal function based on yesterday's labs. (Refused today). 900 ml UOP yesterday with 80 mg IV Lasix. Baseline labs: Cr 2.79 GFR 24. Yesterday noted to have Cr 2.85, BUN 32, K 4.0, Sodium 134. Phosphorous  3.4. He may be nearly euvolemic.  - patient is not an HD candidate at this time, and does not seem interested in pursuing HD in the future at this point - recommend 20 mg lasix PO once daily for patient to be discharged on - would check UA -palliative consult. Patient is agreeable to speaking with palliative and may be a good candidate for hospice   2. Worsening hypoxia/dyspnea - Seems to be pulmonary in etiology. CT notable for bilateral pleural effusions, severe emphysematous changes, bilateral lower lobe atelectasis, partial atelectasis in right middle lobe and posterior left upper lobe.    3. Anemia - in setting of CKD. Has been stable this admission. Patient supplemented with epo, iron  4. T2DM - insulin per primary team  5. Phimosis - managed per primary team. Patient scheduled for outpt circumcision.  Everrett Coombe, MD PGY2

## 2017-02-16 NOTE — Progress Notes (Signed)
Physical Therapy Treatment Patient Details Name: Ross Bauer MRN: [id number] DOB: 11/05/1945 Today's Date: 02/16/2017    History of Present Illness Pt is a 72 y.o. male with a history of type 2 diabetes complicated by stage IV chronic kidney disease, hypertension, hyperlipidemia, tobacco abuse, and chronic low back pain who was recently discharged from the hospital for a multifocal pneumonia. He was admitted after episodes on hypoxia with home health OT with concern for acute on chronic HFpEF with hypoxemia on 2L [place] at home. However, medical team continuing to investigate underlying cause of his pulmonary status.    PT Comments    Pt with limited cooperation with PT, he is agreeable to exercises only; he is easily agitated during session   Follow Up Recommendations  Home health PT     Equipment Recommendations  None recommended by PT    Recommendations for Other Services       Precautions / Restrictions Precautions Precautions: Fall Restrictions Weight Bearing Restrictions: No    Mobility  Bed Mobility Overal bed mobility: Modified Independent Bed Mobility: Supine to Sit;Sit to Supine           General bed mobility comments: pt agreeable to EOB only, refused amb or OOB  Transfers Overall transfer level: Needs assistance Equipment used: None Transfers: Sit to/from Stand Sit to Stand: Min guard         General transfer comment: min guard for safety, patient unsteady   Ambulation/Gait                 Stairs            Wheelchair Mobility    Modified Rankin (Stroke Patients Only)       Balance Overall balance assessment: Needs assistance   Sitting balance-Leahy Scale: Good     Standing balance support: Single extremity supported Standing balance-Leahy Scale: Poor                              Cognition Arousal/Alertness: Awake/alert Behavior During Therapy: Flat affect Overall Cognitive Status: No family/caregiver  present to determine baseline cognitive functioning                                 General Comments: gernerally unhappy, states he's "disgusted" but is not specific as to why      Exercises General Exercises - Lower Extremity Long Arc Quad: AROM;Both;20 reps;Seated Toe Raises: AROM;Both;15 reps;Seated Heel Raises: AROM;Both;15 reps;Seated    General Comments        Pertinent Vitals/Pain Pain Assessment: No/denies pain    Home Living                      Prior Function            PT Goals (current goals can now be found in the care plan section) Acute Rehab PT Goals Patient Stated Goal: feel better  PT Goal Formulation: With patient Time For Goal Achievement: 02/21/17 Potential to Achieve Goals: Fair Progress towards PT goals: Progressing toward goals    Frequency    Min 2X/week      PT Plan Current plan remains appropriate    Co-evaluation              AM-PAC PT "6 Clicks" Daily Activity  Outcome Measure  Difficulty turning over in bed (including adjusting bedclothes, sheets and blankets)?:  None Difficulty moving from lying on back to sitting on the side of the bed? : None Difficulty sitting down on and standing up from a chair with arms (e.g., wheelchair, bedside commode, etc,.)?: A Little Help needed moving to and from a bed to chair (including a wheelchair)?: A Little Help needed walking in hospital room?: A Little Help needed climbing 3-5 steps with a railing? : A Little 6 Click Score: 20    End of Session   Activity Tolerance: Patient tolerated treatment well Patient left: in bed;with call bell/phone within reach;with bed alarm set   PT Visit Diagnosis: Unsteadiness on feet (R26.81);Muscle weakness (generalized) (M62.81);Difficulty in walking, not elsewhere classified (R26.2);History of falling (Z91.81)     Time: [id number] PT Time Calculation (min) (ACUTE ONLY): 10 min  Charges:  $Therapeutic Exercise: 8-22  mins                    G CodesDrucilla Chalet:       Andra Heslin, PT Pager: [phone] 02/16/2017    [facility] Health Care CorporationWILLIAMS,Liliana Dang 02/16/2017, 3:55 PM

## 2017-02-16 NOTE — Progress Notes (Signed)
Occupational Therapy Treatment Patient Details Name: Ross Bauer MRN: [id number] DOB: 1945-02-15 Today's Date: 02/16/2017    History of present illness Pt is a 72 y.o. male with a history of type 2 diabetes complicated by stage IV chronic kidney disease, hypertension, hyperlipidemia, tobacco abuse, and chronic low back pain who was recently discharged from the hospital for a multifocal pneumonia. He was admitted after episodes on hypoxia with home health OT with concern for acute on chronic HFpEF with hypoxemia on 2L [place] at home. However, medical team continuing to investigate underlying cause of his pulmonary status.   OT comments  Pt willing to work on bathing and dressing with OT. Performed seated on 3 in 1 requiring set up to moderate assist. Pt somewhat self limiting, refusing to do his own pericare stating, stating "you can do it faster and easier than I can." Pt agreed to remain up in chair. Will continue to follow.  Follow Up Recommendations  Home health OT;Supervision/Assistance - 24 hour    Equipment Recommendations  None recommended by OT    Recommendations for Other Services      Precautions / Restrictions Precautions Precautions: Fall Restrictions Weight Bearing Restrictions: No       Mobility Bed Mobility Overal bed mobility: Modified Independent             General bed mobility comments: no assist  Transfers Overall transfer level: Needs assistance Equipment used: None Transfers: Sit to/from Stand Sit to Stand: Min guard         General transfer comment: min guard for safety, patient unsteady     Balance Overall balance assessment: Needs assistance   Sitting balance-Leahy Scale: Good     Standing balance support: Single extremity supported Standing balance-Leahy Scale: Poor                             ADL either performed or assessed with clinical judgement   ADL Overall ADL's : Needs assistance/impaired     Grooming:  Wash/dry hands;Wash/dry face;Sitting;Set up   Upper Body Bathing: Minimal assistance;Sitting Upper Body Bathing Details (indicate cue type and reason): assisted with back Lower Body Bathing: Minimal assistance;Sit to/from stand Lower Body Bathing Details (indicate cue type and reason): legs only Upper Body Dressing : Minimal assistance;Sitting   Lower Body Dressing: Minimal assistance;Sit to/from stand Lower Body Dressing Details (indicate cue type and reason): pants only Toilet Transfer: Min guard;Ambulation;BSC   Toileting- Clothing Manipulation and Hygiene: Maximal assistance;Sit to/from stand Toileting - Clothing Manipulation Details (indicate cue type and reason): pt refused to attempt pericare     Functional mobility during ADLs: Min guard(walked around bed holding bed rail) General ADL Comments: pt willing to work with OT on bathing and dressing     Vision       Perception     Praxis      Cognition Arousal/Alertness: Awake/alert Behavior During Therapy: Flat affect Overall Cognitive Status: No family/caregiver present to determine baseline cognitive functioning                                 General Comments: pt grumpy        Exercises     Shoulder Instructions       General Comments      Pertinent Vitals/ Pain       Pain Assessment: No/denies pain  Home Living  Prior Functioning/Environment              Frequency  Min 2X/week        Progress Toward Goals  OT Goals(current goals can now be found in the care plan section)  Progress towards OT goals: Progressing toward goals  Acute Rehab OT Goals Patient Stated Goal: feel better  OT Goal Formulation: With patient Time For Goal Achievement: 02/27/17 Potential to Achieve Goals: Good  Plan Discharge plan remains appropriate    Co-evaluation                 AM-PAC PT "6 Clicks" Daily Activity     Outcome  Measure   Help from another person eating meals?: None Help from another person taking care of personal grooming?: A Little Help from another person toileting, which includes using toliet, bedpan, or urinal?: A Lot Help from another person bathing (including washing, rinsing, drying)?: A Little Help from another person to put on and taking off regular upper body clothing?: A Little Help from another person to put on and taking off regular lower body clothing?: A Little 6 Click Score: 18    End of Session    OT Visit Diagnosis: Other abnormalities of gait and mobility (R26.89);Muscle weakness (generalized) (M62.81)   Activity Tolerance Patient tolerated treatment well   Patient Left in chair;with call bell/phone within reach   Nurse Communication          Time: [id number] OT Time Calculation (min): 34 min  Charges: OT General Charges $OT Visit: 1 Visit OT Treatments $Self Care/Home Management : 23-37 mins  02/16/2017 Martie RoundJulie Osiah Haring, OTR/L Pager: [phone]   Iran PlanasMayberry, [place] BailiffJulie Lynn 02/16/2017, 3:00 PM

## 2017-02-17 ENCOUNTER — Encounter (HOSPITAL_COMMUNITY): Payer: Self-pay | Admitting: Primary Care

## 2017-02-17 ENCOUNTER — Ambulatory Visit: Payer: Medicare HMO

## 2017-02-17 ENCOUNTER — Telehealth: Payer: Self-pay | Admitting: *Deleted

## 2017-02-17 DIAGNOSIS — Z515 Encounter for palliative care: Secondary | ICD-10-CM

## 2017-02-17 DIAGNOSIS — N184 Chronic kidney disease, stage 4 (severe): Secondary | ICD-10-CM

## 2017-02-17 DIAGNOSIS — I129 Hypertensive chronic kidney disease with stage 1 through stage 4 chronic kidney disease, or unspecified chronic kidney disease: Secondary | ICD-10-CM

## 2017-02-17 DIAGNOSIS — J438 Other emphysema: Secondary | ICD-10-CM

## 2017-02-17 DIAGNOSIS — Z7189 Other specified counseling: Secondary | ICD-10-CM

## 2017-02-17 LAB — CREATININE, SERUM
Creatinine, Ser: 3.08 mg/dL — ABNORMAL HIGH (ref 0.61–1.24)
GFR calc Af Amer: 22 mL/min — ABNORMAL LOW (ref >60)
GFR calc non Af Amer: 19 mL/min — ABNORMAL LOW (ref >60)

## 2017-02-17 LAB — GLUCOSE, CAPILLARY
GLUCOSE-CAPILLARY: 108 mg/dL — AB (ref 65–99)
GLUCOSE-CAPILLARY: 161 mg/dL — AB (ref 65–99)
GLUCOSE-CAPILLARY: 126 mg/dL — AB (ref 65–99)
Glucose-Capillary: 159 mg/dL — ABNORMAL HIGH (ref 65–99)

## 2017-02-17 MED ORDER — INSULIN ASPART 100 UNIT/ML ~~LOC~~ SOLN: SUBCUTANEOUS | 11 refills | Status: DC

## 2017-02-17 MED ORDER — FUROSEMIDE 40 MG PO TABS
40.0000 mg | ORAL_TABLET | Freq: Every day | ORAL | Status: DC
  Administered 2017-02-18: 40 mg via ORAL
  Filled 2017-02-17: qty 1

## 2017-02-17 MED ORDER — INSULIN ASPART 100 UNIT/ML ~~LOC~~ SOLN: 0.0000 [IU] | Freq: Three times a day (TID) | SUBCUTANEOUS | 11 refills | Status: DC

## 2017-02-17 MED ORDER — NEPRO/CARBSTEADY PO LIQD: 237.0000 mL | Freq: Three times a day (TID) | ORAL | 3 refills | Status: AC

## 2017-02-17 MED ORDER — INSULIN ASPART 100 UNIT/ML ~~LOC~~ SOLN: 0.0000 [IU] | Freq: Every day | SUBCUTANEOUS | 11 refills | Status: DC

## 2017-02-17 NOTE — Progress Notes (Signed)
Ross Bauer Progress Note    Assessment/ Plan:   1. CKD IV - Cr rising at 3.08 from baseline of about 2.8.  575 ml UOP yesterday. Patient has been intermittently refusing labs, was gently diuresed with 20 mg PO lasix yesterday with the thought that this might be continued at home. He is s/p 80 mg IV lasix 2 days ago. - patient is not an HD candidate at this time, and does not seem interested in pursuing HD in the future at this point - restart home dose 40 mg lasix daily - palliative consult placed. Patient is agreeable to speaking with palliative and may be a good candidate for hospice   2. Worsening hypoxia/dyspnea - Seems to be pulmonary in etiology. CT notable for bilateral pleural effusions, severe emphysematous changes, bilateral lower lobe atelectasis, partial atelectasis in right middle lobe and posterior left upper lobe.    3. Anemia - in setting of CKD. Has been stable this admission. Patient supplemented with epo, iron  4. T2DM - insulin per primary team  5. Phimosis - managed per primary team. Patient scheduled for outpt circumcision.    Subjective:   Patient reports his breathing is somewhat improved this AM. Otherwise he would prefer not to be disturbed. He agrees to let me do a brief physical exam.   Objective:   BP (!) 137/57 (BP Location: Left Arm)   Pulse 91   Temp 99.1 F (37.3 C) (Oral)   Resp 17   Ht 5\' 6"  (1.676 m)   Wt 166 lb 7.2 oz (75.5 kg)   SpO2 94%   BMI 26.87 kg/m   Intake/Output Summary (Last 24 hours) at 02/17/2017 9147 Last data filed at 02/17/2017 8295 Gross per 24 hour  Intake 240 ml  Output 575 ml  Net -335 ml   Weight change: 20 lb 1 oz (9.1 kg)  Physical Exam: Gen:NAD, rests in bed CVS:RRR, no m/r/g Resp: soft scattered rales Abd: soft and nontender Ext:no LE edema  Imaging: Ct Chest Wo Contrast  Result Date: 02/15/2017 CLINICAL DATA:  Hypoxemia, history hypertension, chronic kidney disease, type II diabetes  mellitus, former smoker, CHF EXAM: CT CHEST WITHOUT CONTRAST TECHNIQUE: Multidetector CT imaging of the chest was performed following the standard protocol without IV contrast. Sagittal and coronal MPR images reconstructed from axial data set. COMPARISON:  Chest radiograph 02/10/2017 FINDINGS: Cardiovascular: Atherosclerotic calcifications of aorta, proximal great vessels and coronary arteries. Small pericardial effusion. Heart normal size. Aorta normal caliber. Mediastinum/Nodes: Scattered normal size mediastinal lymph nodes with additional mildly enlarged lymph nodes prevascular 12 mm short axis image 46 and at AP window 11 mm short axis image 60. Esophagus grossly unremarkable. Base of cervical region normal appearance. No axillary adenopathy. Lungs/Pleura: Moderate BILATERAL pleural effusions. Severe emphysematous changes greatest in upper lobes. Significant atelectasis in BILATERAL lower lobes and in the posterior aspect of the LEFT upper lobe. Partial atelectasis RIGHT middle lobe. No definite pulmonary infiltrate or mass. No pneumothorax. Upper Abdomen: Unremarkable Musculoskeletal: No acute osseous findings. IMPRESSION: Significant emphysematous changes with moderate BILATERAL pleural effusions and significant compressive atelectasis of the lungs. Extensive atherosclerotic disease including coronary arteries with small pericardial effusion. Few nonspecific minimally enlarged mediastinal lymph nodes. Remainder of exam unremarkable. Aortic Atherosclerosis (ICD10-I70.0) and Emphysema (ICD10-J43.9). Electronically Signed   By: Ulyses Southward M.D.   On: 02/15/2017 18:48    Labs: [employer] Recent Labs  Lab 02/10/17 1620 02/11/17 0636 02/12/17 1532 02/13/17 0934 02/14/17 1049 02/15/17 1406 02/16/17 1916 02/17/17 0547  NA 137 136 134* 135 135 134* 135  --   K 4.3 3.9 3.9 3.6 4.5 4.0 4.2  --   CL 103 106 100* 100* 102 102 100*  --   CO2 22 21* 22 24 21* 23 26  --   GLUCOSE 217* 172* 106* 188* 247* 121*  183*  --   BUN 47* 44* 36* 38* 37* 32* 34*  --   CREATININE 2.81* 2.74* 2.84* 2.84* 2.86* 2.85* 2.99* 3.08*  CALCIUM 8.5* 8.1* 7.9* 8.0* 8.0* 8.1* 7.9*  --    CBC Recent Labs  Lab 02/10/17 1620 02/11/17 0636  WBC 9.7 7.3  NEUTROABS 7.3  --   HGB 11.2* 9.7*  HCT 35.5* 30.9*  MCV 93.2 93.1  PLT 385 315    Medications:    . amLODipine  10 mg Oral Daily  . atorvastatin  80 mg Oral q1800  . darbepoetin (ARANESP) injection - NON-DIALYSIS  200 mcg Subcutaneous Once  . enoxaparin (LOVENOX) injection  30 mg Subcutaneous Q24H  . feeding supplement (NEPRO CARB STEADY)  237 mL Oral TID BM  . ferrous sulfate  325 mg Oral Q breakfast  . furosemide  20 mg Oral Daily  . insulin aspart  0-15 Units Subcutaneous TID WC  . insulin aspart  0-5 Units Subcutaneous QHS    Ross PouchLauren Ferdie Bakken, MD PGY2 02/17/2017, 8:14 AM

## 2017-02-17 NOTE — Progress Notes (Signed)
Name: Ross Bauer MRN: [id number] DOB: 1945/03/19    ADMISSION DATE:  02/10/2017 CONSULTATION DATE:  02/15/2017  REFERRING MD :  Dr. Josem Kaufmann  CHIEF COMPLAINT:  Shortness of breath  HISTORY OF PRESENT ILLNESS:   HPI obtained from medical chart review as limited information obtained from patient secondary to poor cooperation and/or historian.  72 year old male with PMH as below significant for but not limited to tobacco use (quit 2 months ago), HTN, diastolic heart failure, CKD stage 4, HLD, DMT2, and chronic low back pain admitted to [facility]MT[facility] on 2/1 presenting with ongoing hypoxia and progressive dyspnea.  Patient recently hospitalized from 12/24 to1/7 and treated with multifocal pneumonia and sent home on 2L O2.  Home health found him hypoxic in the the 80's on his 2L and sent him to the ER. CXR showed worsening bilateral pulmonary infiltrates. Denied fever, cough, or chills but reports new bilateral lower leg swelling. His BNP was mildly elevated on admit. He did not have fever, or leukocytosis on admit.  He was felt to be hypervolemic and was diuresed for presumed acute on chronic diastolic heart failure with minimal improvement in dyspnea or O2 requirements.  Cardiology was consulted on 2/6 and felt that he was euvolemic, with stable weights, and pulmonary changes due to underlying undiagnosed lung disease or multifocal pneumonia.  Therefore Pulmonary consulted for further input.  Non-contrast chest CT ordered for later today.  SUBJECTIVE:  Says breathing is "about the same".  Has refused lab draws.  CT chest reviewed, shows significant emphysematous changes bilaterally as well as moderate bilateral effusions.  Diuresis has been limited due to renal function (and inability to follow SCr as pt refusing labs).   VITAL SIGNS: Temp:  [97.9 F (36.6 C)-99.1 F (37.3 C)] 99.1 F (37.3 C) (02/08 0644) Pulse Rate:  [88-91] 91 (02/08 0644) Resp:  [16-19] 17 (02/08 0644) BP: (117-145)/(49-89) 137/57  (02/08 0644) SpO2:  [93 %-94 %] 94 % (02/08 0644) Weight:  [75.5 kg (166 lb 7.2 oz)] 75.5 kg (166 lb 7.2 oz) (02/08 0644)  PHYSICAL EXAMINATION: General:  Chronically ill appearing male, in NAD. PSY:  Flat affect, short with responses. Neuro: Easily awakens, seems appropriate/ oriented, MAE. CV: RRR. PULM: even/non-labored on 4L [place], diminished in bases, faint crackles. Extremities: warm/dry, trace BLE edema. Skin: no rashes.   Recent Labs  Lab 02/14/17 1049 02/15/17 1406 02/16/17 1916 02/17/17 0547  NA 135 134* 135  --   K 4.5 4.0 4.2  --   CL 102 102 100*  --   CO2 21* 23 26  --   BUN 37* 32* 34*  --   CREATININE 2.86* 2.85* 2.99* 3.08*  GLUCOSE 247* 121* 183*  --    Recent Labs  Lab 02/10/17 1620 02/11/17 0636  HGB 11.2* 9.7*  HCT 35.5* 30.9*  WBC 9.7 7.3  PLT 385 315   Ct Chest Wo Contrast  Result Date: 02/15/2017 CLINICAL DATA:  Hypoxemia, history hypertension, chronic kidney disease, type II diabetes mellitus, former smoker, CHF EXAM: CT CHEST WITHOUT CONTRAST TECHNIQUE: Multidetector CT imaging of the chest was performed following the standard protocol without IV contrast. Sagittal and coronal MPR images reconstructed from axial data set. COMPARISON:  Chest radiograph 02/10/2017 FINDINGS: Cardiovascular: Atherosclerotic calcifications of aorta, proximal great vessels and coronary arteries. Small pericardial effusion. Heart normal size. Aorta normal caliber. Mediastinum/Nodes: Scattered normal size mediastinal lymph nodes with additional mildly enlarged lymph nodes prevascular 12 mm short axis image 46 and at AP  window 11 mm short axis image 60. Esophagus grossly unremarkable. Base of cervical region normal appearance. No axillary adenopathy. Lungs/Pleura: Moderate BILATERAL pleural effusions. Severe emphysematous changes greatest in upper lobes. Significant atelectasis in BILATERAL lower lobes and in the posterior aspect of the LEFT upper lobe. Partial atelectasis RIGHT  middle lobe. No definite pulmonary infiltrate or mass. No pneumothorax. Upper Abdomen: Unremarkable Musculoskeletal: No acute osseous findings. IMPRESSION: Significant emphysematous changes with moderate BILATERAL pleural effusions and significant compressive atelectasis of the lungs. Extensive atherosclerotic disease including coronary arteries with small pericardial effusion. Few nonspecific minimally enlarged mediastinal lymph nodes. Remainder of exam unremarkable. Aortic Atherosclerosis (ICD10-I70.0) and Emphysema (ICD10-J43.9). Electronically Signed   By: Ulyses SouthwardMark  Boles M.D.   On: 02/15/2017 18:48     STUDIES:  CT Chest 2/6 > significant emphysematous changes bilaterally, mod pleural effusions bilaterally with compressive atx.  ASSESSMENT / PLAN:   Acute on chronic hypoxic respiratory failure - multifactorial, see below. Bilateral pleural effusions - presumed due to dCHF. Significant emphysema. Former smoker (quit Dec 2018). P:  Continue supplemental O2 as needed to maintain SpO2 > 92%. Would continue to push diuresis as BP and SCr permit (have re-iterated importance of agreeing and allowing blood work to be drawn). Would likely benefit from therapeutic and diagnostic thoracentesis (suspect this is transudative process from underlying dCHF); however, pt adamantly refuses procedure being done today (Friday, 2/8).  He says "I will think about it, maybe will do on Monday". Agree with palliative care input for goals of care discussions given pt's non-compliance. Continue BD's.   Nothing further to add.  If pt is willing to undergo a thoracentesis, you can consult IR or call PCCM back. For now, PCCM will sign off.  Please call us back if we can be of any further assistance.   Rutherford Guysahul Desai, PA - [facility] Pager: [phone]  or [phone] 02/17/2017, 10:59 AM  Attending:  I have seen and examined the patient with nurse practitioner/resident and  agree with the note above.  We formulated the plan together and I elicited the following history.    Subjective: Hypoxemia  Objective: Vitals:   02/16/17 0455 02/16/17 1454 02/16/17 2128 02/17/17 0644  BP: (!) 151/60 (!) 145/89 (!) 117/49 (!) 137/57  Pulse: 97 88 90 91  Resp: 19 16 19 17   Temp: 98.5 F (36.9 C) 97.9 F (36.6 C) 99.1 F (37.3 C) 99.1 F (37.3 C)  TempSrc: Oral Oral Oral Oral  SpO2: 98% 94% 93% 94%  Weight: 66.4 kg (146 lb 6.2 oz)   75.5 kg (166 lb 7.2 oz)  Height:          Intake/Output Summary (Last 24 hours) at 02/17/2017 1145 Last data filed at 02/17/2017 0910 Gross per 24 hour  Intake 540 ml  Output 576 ml  Net -36 ml   General: awake and alert. No distress observed. Chest: decreased breath sounds at the bases. No wheezes, crackles, rhonchi or bronchophony. Cor: s1s2, no murmur, gallop or rub. Abd: no organomegaly. Ext: no cyanosis. +pedal edema.   CBC    Component Value Date/Time   WBC 7.3 02/11/2017 0636   RBC 3.32 (L) 02/11/2017 0636   HGB 9.7 (L) 02/11/2017 0636   HCT 30.9 (L) 02/11/2017 0636   PLT 315 02/11/2017 0636   MCV 93.1 02/11/2017 0636   MCH 29.2 02/11/2017 0636   MCHC 31.4 02/11/2017 0636   RDW 15.2 02/11/2017 0636   LYMPHSABS  1.8 02/10/2017 1620   MONOABS 0.5 02/10/2017 1620   EOSABS 0.1 02/10/2017 1620   BASOSABS 0.0 02/10/2017 1620    BMET    Component Value Date/Time   NA 135 02/16/2017 1916   NA 139 05/29/2015 1402   K 4.2 02/16/2017 1916   CL 100 (L) 02/16/2017 1916   CO2 26 02/16/2017 1916   GLUCOSE 183 (H) 02/16/2017 1916   BUN 34 (H) 02/16/2017 1916   BUN 36 (H) 05/29/2015 1402   CREATININE 3.08 (H) 02/17/2017 0547   CREATININE 2.69 (H) 03/26/2014 0913   CALCIUM 7.9 (L) 02/16/2017 1916   GFRNONAA 19 (L) 02/17/2017 0547   GFRNONAA 23 (L) 03/26/2014 0913   GFRAA 22 (L) 02/17/2017 0547   GFRAA 27 (L) 03/26/2014 0913    CXR images  CT CHEST WO CONTRAST  Final Result    VAS [place] LOWER EXTREMITY VENOUS (DVT)   Final Result    UE Venous Duplex ([facility] and [facility] ONLY)  Final Result    DG Chest Portable 1 View  Final Result      ASSESSMENT / PLAN:   Acute on chronic hypoxic respiratory failure - multifactorial, see below. Bilateral pleural effusions - most likely due to [facility]. Emphysema/COPD Former smoker (quit Dec 2018). P:  Continue supplemental O2 as needed to maintain SpO2 > 92%. Patient also on home oxygen. Would continue to diurese as BP and SCr permit. Would likely benefit from therapeutic and diagnostic thoracentesis (suspect this is transudative process from underlying dCHF); however, pt adamantly refuses procedure being done today (Friday, 2/8).  He says "I will think about it, maybe will do on Monday". Agree with palliative care input for goals of care discussions given pt's non-compliance. Continue BD's.   Nothing further to add.  If pt is willing to undergo a thoracentesis, you can consult IR or call PCCM back. For now, PCCM will sign off.  Please call us back if we can be of any further assistance.    Elayne Snare, MD [place] [facility] Pager: [phone]

## 2017-02-17 NOTE — Progress Notes (Addendum)
1726 Spoke with pt's daughters Ross Bauer and Ross Bauer to notify them [name] was being discharged today and he is ready to be picked up. They stated there will be no hospice nurse until Sunday and without the continuum of care there will be no one coming to pick him up.   Call placed to [facility]. Waiting for a return phone call. MD paged.   1734 Dr. Frances FurbishWinfrey notified. Will update as necessary.   1735 spoke with Ross ManisElizabeth, RN ([facility]) She has hospice nurse scheduled for Sunday at 10am. She is going to make a few calls and return call to me.   [id number] Spoke with Ross Bauer at Hospice. She called the daughters. Daughters would like to cancel hospice and have home health instead. Ross Bauer explained to family that their dad is alert and oriented and they are not able to cancel hospice for him as he has agreed to it.   Ross Bauer states she can not be here to pick him up until tomorrow 2/9. Dr. Frances FurbishWinfrey notified.   1840 I personally spoke with daughter Ross Bauer. She states she will be here after 4pm tomorrow to pick up her dad. I will notify Dr. Frances FurbishWinfrey.

## 2017-02-17 NOTE — Discharge Instructions (Signed)

## 2017-02-17 NOTE — Progress Notes (Addendum)
Notified by Herbert SetaHeather, [facility] of [facility]. Chart and patient information reviewed and hospice eligibility confirmed.  Spoke with patient, at bedside to initiate education related to hospice philosophy, services and team approach to care. Patient verbalized understanding of the information provided. Per discussion, plan is for discharge to home by personal vehicle with grandson, Mena GoesQuinton today.   Patient will need prescriptions for discharge comfort medications.  DME needs discussed and patient denied any needs.  He reports having oxygen, bed, walker, 3N1 and [facility].  [facility] aware of the above.  Completed discharge summary will need [facility] at [phone] when final.  Please notify [facility] when patient is ready to leave unit at discharge-call [phone] or [phone] after 5 PM or on the weekend.  [facility] information and contact numbers have been given to patient during visit.   Please call with any questions.   Thank You,   Hessie KnowsStacie Wilkinson RN, BSN  [facility] Hospital Liaison  [phone]

## 2017-02-17 NOTE — Progress Notes (Signed)
1706 Patient provided discharge instructions. Administration of discharge medications was clearly explained to patient. I specifically reviewed insulin administration with the patient several times. I asked the patient to please repeat to me how to administer insulin and he was able to tell me to check the blood sugar and administer insulin units based on the sliding scale. He also stated his daughter would be able to explain it to him. Patient did not have any follow up questions.   He is aware of where to pick up his medications and all follow up doctor's appointments.   Portable O2 was delivered to patient by [facility].   Patient is waiting for grandson to arrive.

## 2017-02-17 NOTE — Progress Notes (Signed)
Subjective:  Today we talked to Mr. Sewell about his current clinical status.  He still does not know if he feels any better or worse.  Does not feel short of breath as long as he has the oxygen going.  No chest pain, still no cough or fevers.  At this point after talking with [name] he feels that he would like to go home with hospice.  He feels that we are not doing anything for him here and that he is tired of the treatments and therapies and wishes to live his final days in peace.   Objective:  Vital signs in last 24 hours: Vitals:   02/16/17 0455 02/16/17 1454 02/16/17 2128 02/17/17 0644  BP: (!) 151/60 (!) 145/89 (!) 117/49 (!) 137/57  Pulse: 97 88 90 91  Resp: 19 16 19 17   Temp: 98.5 F (36.9 C) 97.9 F (36.6 C) 99.1 F (37.3 C) 99.1 F (37.3 C)  TempSrc: Oral Oral Oral Oral  SpO2: 98% 94% 93% 94%  Weight: 146 lb 6.2 oz (66.4 kg)   166 lb 7.2 oz (75.5 kg)  Height:       Physical Exam  Constitutional: He is oriented to person, place, and time. He appears well-developed and well-nourished.  Eyes: Right eye exhibits no discharge. Left eye exhibits no discharge. No scleral icterus.  Neck: JVD present.  Cardiovascular: Normal rate, regular rhythm, normal heart sounds and intact distal pulses. Exam reveals no gallop and no friction rub.  No murmur heard. Pulmonary/Chest: Effort normal. No respiratory distress. He has no wheezes. He has rales in the right lower field and the left lower field.  Abdominal: Soft. Bowel sounds are normal. He exhibits no distension and no mass. There is no tenderness. There is no guarding.  Musculoskeletal:       Right lower leg: He exhibits edema.       Left lower leg: He exhibits no edema.  Neurological: He is alert and oriented to person, place, and time.   BMP Latest Ref Rng & Units 02/17/2017 02/16/2017 02/15/2017  Glucose 65 - 99 mg/dL - 409(W183(H) 119(J121(H)  BUN 6 - 20 mg/dL - 47(W34(H) 29(F32(H)  Creatinine 0.61 - 1.24 mg/dL 6.21(H3.08(H) 0.86(V2.99(H) 7.84(O2.85(H)    BUN/Creat Ratio 10 - 24 - - -  Sodium 135 - 145 mmol/L - 135 134(L)  Potassium 3.5 - 5.1 mmol/L - 4.2 4.0  Chloride 101 - 111 mmol/L - 100(L) 102  CO2 22 - 32 mmol/L - 26 23  Calcium 8.9 - 10.3 mg/dL - 7.9(L) 8.1(L)   Assessment/Plan:  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  -will switch pt back to home dose of lasix 40mg  -home amlodipine, PRN hydral added as well -strict I's and O's -fluid restriction 1200cc -pt will need to be discharged with 4L O2, was already on 2L when he was admitted -weights inaccurate   T2DM  Poorly controlled last A1C 11.3  in December 2018  -SSI moderate  Anemia of CKD Chronic, stable, on iron supplementation, weekly epo  -continue iron, epo   CKD IV w/proteinuria Chronic stable baseline appears to be around 2.8, was 2.81 on admission  -stable, Cr with mild rise this am -spot urine protein/cr shows estimated 1.5g protein per day so not quite nephrotic range but clearly losing significant amount of protein.  Likely underestimated since pt is so thin    Phimosis  -contacted urology, will not come by and see pt unless completely obstructed -made outpatient appointment for 02/28/17 for circumcision  Dispo: Anticipated discharge today home with hospice.    Angelita Ingles, MD 02/17/2017, 11:46 AM Thornell Mule MD PGY-1 Internal Medicine Pager # [phone]

## 2017-02-17 NOTE — Telephone Encounter (Signed)
Verbal order given per Dr Josem KaufmannKlima to be "Attending of Record" (pt's preference per Britt BoozerJenn). [facility] vo given.

## 2017-02-17 NOTE — Consult Note (Signed)
Consultation Note Date: 02/17/2017   Patient Name: Ross Bauer  DOB: 1945-11-12  MRN: [id number]  Age / Sex: 72 y.o., male  PCP: Oval Linsey, MD Referring Physician: Oval Linsey, MD  Reason for Consultation: Establishing goals of care  HPI/Patient Profile: 72 y.o. male  with past medical history of high blood pressure and cholesterol, stage IV kidney disease, type 2 diabetes, anemia of chronic disease, BPH, chronic back pain, history of pneumonia December 2018, tobacco use admitted on 02/10/2017 with mild heart failure and what is likely paraseptal emphysema.  PMT consulted for goals of care, patient wanting to go home seeking more comfort than aggressive care.  Clinical Assessment and Goals of Care:  I have reviewed medical records including EPIC notes, labs and imaging, received report from lindsey bedside RN, assessed the patient and then met Mr. Martino at the bedside.  He appears frail, and will  only briefly make eye contact.  He states several times "you are wasting my time".  I share that I am here to help him get home, find out what is important to him.  He seems angry, nursing staff note that this is been his demeanor since admission.  When I share with Mr. Torr "you sound angry", he states that I should not tell him how he feels.  We talked about disposition, share the benefits of hospice.  He states that he would like in-home hospice services.  I ask if  I could call his daughter April.  He agrees.  We did briefly talk about CODE STATUS, but again, Mr. Locklin states "lady, you are wasting my time".  Call to daughter, April Eroh to discuss diagnosis prognosis, [place], EOL wishes, disposition and options.  I introduced Palliative Medicine as specialized medical care for people living with serious illness. It focuses on providing relief from the symptoms and stress of a serious illness. The goal  is to improve quality of life for both the patient and the family.  We discussed a brief life review of the patient.  Mr. Kindt lives in his home with his grandson, April's son, Rylon Poitra as a caregiver.   As far as functional and nutritional status April shares that her father is left alone in the daytime as family works.  He will get up and use the bedside commode, but will not go as far as the bathroom stating that he is unable.  She endorses is poor mobility.  We talked about low albumin.  April states that her father has been eating less, often only taking a few bites of food at a time.   We discussed their current illness and what it means in the larger context of their on-going co-morbidities.  Natural disease trajectory and expectations at EOL were discussed.    The difference between aggressive medical intervention and comfort care was considered in light of the patient's goals of care.  We talked about the difference between residential hospice and in-home hospice services.  Advanced directives, concepts specific to code status and  rehospitalization were considered and discussed.  April states her father would never want to be intubated/ventilated.  She states he would not want to be kept alive on machines.  She states that he would want CPR.  I share the realities of CPR, that if he were to be so sick to need chest compressions, he would also need intubation.  I encouraged her to think about these choices for her father.  I share that life support, CPR, would not change his kidney function, his heart, or his lungs.  April agrees.  Hospice and Palliative Care services outpatient were explained and offered.  April states that their choice of in-home hospice providers would be [facility], as this is close to them.  Questions and concerns were addressed.   Conferences with nursing staff related to care and disposition. Conference is with case management related to disposition.  Conference  with resident/hospitalist related to disposition.   Healthcare POA NEXT OF KIN daughters April and Grady Lions, who is a Education officer, museum living in [place], share decision making, no legal paperwork.  April and I discussed the legalities of [facility] POA, I share that as long as she and family are in agreement, the law states that she and her sister are surrogate decision makers.  SUMMARY OF RECOMMENDATIONS   Mr. Damon is agreeable to home with the benefits of hospice.  Daughter April Troost and daughter Mansfield Lions are also in agreement.  Choices provided, they choose [facility] for in-home hospice services.  Code Status/Advance Care Planning:  Limited code Mr. Mees is unwilling to talk about code status, but his daughter April states he would not want intubation/ventilation.  "No machines to keep him alive".   yes to CPR.  We discuss the realities of CPR without intubation.   Symptom Management:   Per hospitalist no additional needs at this time.   Palliative Prophylaxis:   Frequent Pain Assessment  Additional Recommendations (Limitations, Scope, Preferences):  treat the treatable, no intubation/ventilation  Psycho-social/Spiritual:   Desire for further Chaplaincy support:no  Additional Recommendations: Caregiving  Support/Resources and Education on Hospice  Prognosis:  < 3 months, or less would not be surprising based on functional status, frailty, albumin 1.17 December 2016, 2.1 on this admit.  Discharge Planning: Patient and family are agreeable to home with the benefits of hospice services for comfort and dignity at end of life.      Primary Diagnoses: Present on Admission: . Type 2 diabetes mellitus with stage 4 chronic kidney disease ([place]) . Anemia in chronic kidney disease . Essential hypertension . 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 The  following aspects are pertinent.  Past Medical History:  Diagnosis Date  . Acute on chronic diastolic (congestive) heart failure ([place])   . Acute on chronic respiratory failure ([place])   . Anemia in chronic kidney disease 06/19/2009  . Background diabetic retinopathy associated with type 2 diabetes mellitus ([place]) 11/13/2013   Bilateral. Followed by ophthalmologist Dr. Clent Jacks.  [name] BPH (benign prostatic hyperplasia) 09/30/2016  . Chronic back pain 05/09/2007   "all over" (01/02/2017)  . Erectile dysfunction associated with type 2 diabetes mellitus ([place]) 2001  . Essential hypertension   . Hyperlipidemia   . Onychomycosis of right great toe 06/01/2015  . Pneumonia 12/2016   CAP LLL 12/2016  . 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 Florene Glen.  . Tobacco abuse 03/20/2007  . Type 2 diabetes mellitus with stage 4 chronic kidney disease ([place])    Social History   Socioeconomic History  . Marital status: Divorced    Spouse name: None  . Number of children: 2  . Years of education: None  . Highest education level: None  Social Needs  . Financial resource strain: None  . Food insecurity - worry: None  . Food insecurity - inability: None  . Transportation needs - medical: None  . Transportation needs - non-medical: None  Occupational History  . 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 Smoking status: Former Smoker    Packs/day: 0.12    Years: 50.00    Pack years: 6.00    Types: Cigarettes    Last attempt to quit: 01/04/2017    Years since quitting: 0.1  . Smokeless tobacco: Never Used  Substance and Sexual Activity  . Alcohol use: No    Alcohol/week: 0.0 oz  . Drug use: No  . Sexual activity: No  Other Topics Concern  . None  Social History Narrative   Limited finances for which he frequently skips medications for a month or two.            Family History  Problem Relation Age of Onset  . Cancer Mother        died in 49s.  . Cancer Father        died in 43s  . Healthy Sister   . Healthy Brother   . Unexplained death Brother   . Healthy Daughter   . Healthy Daughter    Scheduled Meds: . amLODipine  10 mg Oral Daily  . atorvastatin  80 mg Oral q1800  . darbepoetin (ARANESP) injection - NON-DIALYSIS  200 mcg Subcutaneous Once  . enoxaparin (LOVENOX) injection  30 mg Subcutaneous Q24H  . feeding supplement (NEPRO CARB STEADY)  237 mL Oral TID BM  . ferrous sulfate  325 mg Oral Q breakfast  . [START ON 02/18/2017] furosemide  40 mg Oral Daily  . insulin aspart  0-15 Units Subcutaneous TID WC  . insulin aspart  0-5 Units Subcutaneous QHS   Continuous Infusions: PRN Meds:.acetaminophen **OR** acetaminophen, albuterol, hydrALAZINE Medications Prior to Admission:  Prior to Admission medications   Medication Sig Start Date End Date Taking? Authorizing Provider  albuterol (PROVENTIL HFA;VENTOLIN HFA) 108 (90 Base) MCG/ACT inhaler Inhale 2 puffs into the lungs every 6 (six) hours as needed for wheezing or shortness of breath. 01/14/17  Yes Nedrud, Larena Glassman, MD  amLODipine (NORVASC) 10 MG tablet Take 1 tablet (10 mg total) daily by mouth. 11/16/16  Yes Oval Linsey, MD  aspirin 81 MG tablet Take 1 tablet (81 mg total) by mouth daily. 04/30/13  Yes Emokpae, Ejiroghene E, MD  atorvastatin (LIPITOR) 80 MG tablet Take 1 tablet (80 mg total) by mouth daily at 6 PM. 03/04/16  Yes Oval Linsey, MD  Darbepoetin Alfa (ARANESP) 200 MCG/0.4ML SOSY injection Inject 0.4 mLs (200 mcg total) into the skin every Sunday at 6pm. 01/15/17  Yes Nedrud, Larena Glassman, MD  ferrous sulfate 325 (65 FE) MG tablet Take 1 tablet (325 mg total) by mouth daily with breakfast. 01/15/17  Yes Nedrud, Larena Glassman, MD  furosemide (LASIX) 40 MG tablet Take 1 tablet (40 mg total) by mouth daily. 05/29/15  Yes Corky Sox, MD  glipiZIDE (GLUCOTROL) 10 MG  tablet Take 1 tablet  (10 mg total) by mouth 2 (two) times daily before a meal. 03/04/16  Yes Oval Linsey, MD  rosuvastatin (CRESTOR) 40 MG tablet Take 1 tablet (40 mg total) by mouth daily. 01/05/11 11/11/11  Kalia-Reynolds, Daivd Council, DO   Allergies  Allergen Reactions  . Ace Inhibitors     REACTION: intolerance   Review of Systems  Unable to perform ROS: Other    Physical Exam  Constitutional: He appears ill.  Appears chronically ill, not cooperative today  HENT:  Head: Atraumatic.  Cardiovascular: Normal rate.  Pulmonary/Chest: Effort normal. No respiratory distress.  Abdominal: Soft. He exhibits no distension.  Neurological: He is alert.  declines to answer questions  Skin: Skin is warm.  Psychiatric: His mood appears not anxious. He is agitated.  Nursing note and vitals reviewed.   Vital Signs: BP (!) 137/57 (BP Location: Left Arm)   Pulse 91   Temp 99.1 F (37.3 C) (Oral)   Resp 17   Ht '5\' 6"'$  (1.676 m)   Wt 75.5 kg (166 lb 7.2 oz)   SpO2 94%   BMI 26.87 kg/m  Pain Assessment: No/denies pain   Pain Score: 0-No pain   SpO2: SpO2: 94 % O2 Device:SpO2: 94 % O2 Flow Rate: .O2 Flow Rate (L/min): 4 L/min  IO: Intake/output summary:   Intake/Output Summary (Last 24 hours) at 02/17/2017 1111 Last data filed at 02/17/2017 0910 Gross per 24 hour  Intake 540 ml  Output 576 ml  Net -36 ml    LBM: Last BM Date: 02/16/17 Baseline Weight: Weight: 68 kg (150 lb) Most recent weight: Weight: 75.5 kg (166 lb 7.2 oz)     Palliative Assessment/Data:   Flowsheet Rows     Most Recent Value  Intake Tab  Referral Department  Hospitalist  Unit at Time of Referral  Med/Surg Unit  Palliative Care Primary Diagnosis  Pulmonary  Date Notified  02/16/17  Palliative Care Type  New Palliative care  Reason for referral  Clarify Goals of Care  Date of Admission  02/10/17  Date first seen by Palliative Care  02/17/17  # of days Palliative referral response time  1 Day(s)  # of days IP prior to  Palliative referral  6  Clinical Assessment  Palliative Performance Scale Score  40%  Pain Max last 24 hours  Not able to report  Pain Min Last 24 hours  Not able to report  Dyspnea Max Last 24 Hours  Not able to report  Dyspnea Min Last 24 hours  Not able to report  Psychosocial & Spiritual Assessment  Palliative Care Outcomes  Patient/Family meeting held?  Yes  Who was at the meeting?  patient at bedside, daughter April via phone  [place] regarding hospice, Clarified goals of care, Provided psychosocial or spiritual support, Provided advance care planning  Patient/Family wishes: Interventions discontinued/not started   Mechanical Ventilation      Time In: 1005 Time Out: 1120 Time Total: 75 minutes Greater than 50%  of this time was spent counseling and coordinating care related to the above assessment and plan.  Signed by: Drue Novel, NP   Please contact Palliative Medicine Team phone at [phone] for questions and concerns.  For individual provider: See Shea Evans

## 2017-02-17 NOTE — Progress Notes (Addendum)
1840 Daughter Amy [phone]) would like a call from the case manager on 2/9 to schedule home health for her father. She states he will be home alone for 20 hours a day and needs assistance.   1900 Dr. Frances FurbishWinfrey aware of current situation and will not have ride to go home until tomorrow.

## 2017-02-17 NOTE — Telephone Encounter (Signed)
[facility] [place] [facility] - requesting verbal order for Dr Josem KaufmannKlima to be pt's Attending of Record. They plan on seeing pt tomorrow but needs this verified first.

## 2017-02-17 NOTE — Progress Notes (Signed)
Internal Medicine Attending  Date: 02/17/2017  Patient name: Ross ManifoldJulius L Macaraeg Medical record number: [id number] Date of birth: 10/31/1945 Age: 72 y.o. Gender: male  I saw and evaluated the patient. I reviewed the resident's note by Dr. Frances FurbishWinfrey and I agree with the resident's findings and plans as documented in his progress note.  When seen on rounds this morning [name] seemed interested in discussing goals of care with palliative care and discharge home today. We appreciate palliative care's assistance with this difficult communication. Upon discharge we will continue with oral diuretics and bronchodilators for his chronic diastolic heart failure and paraseptal emphysema.

## 2017-02-17 NOTE — Care Management Note (Addendum)
Case Management Note  Patient Details  Name: Ross Bauer MRN: [id number] Date of Birth: 05/17/1945  Subjective/Objective:                    Action/Plan: Discussed home with hospice with patient. Patient agreeable. He lives with grandson, has home oxygen already through [facility] . Patient states he has portable oxygen and his family will transport him home in private car at discharge.  Spoke with April ( daughter ) [phone] , she is in agreement with above and aware discharge is today. She can provide transportation home.   [facility] [place] [facility] [place] aware discharge is today.  Expected Discharge Date:                  Expected Discharge Plan:  Home w Hospice Care  In-House Referral:  NA  Discharge planning Services  CM Consult  Post Acute Care Choice:  Resumption of Svcs/PTA Provider Choice offered to:  Patient  DME Arranged:  N/A DME Agency:  NA  HH Arranged:  NA HH Agency:  [facility]  Status of Service:  Completed, signed off  If discussed at Long Length of Stay Meetings, dates discussed:    Additional Comments:  Kingsley PlanWile, Courtenay Creger Marie, RN 02/17/2017, 11:29 AM

## 2017-02-18 LAB — GLUCOSE, CAPILLARY
Glucose-Capillary: 144 mg/dL — ABNORMAL HIGH (ref 65–99)
Glucose-Capillary: 144 mg/dL — ABNORMAL HIGH (ref 65–99)

## 2017-02-18 NOTE — Progress Notes (Signed)
Called and spoke with April, daughter and advised her that pt can go home today.  She will come and pick him up after work at 1700.

## 2017-02-18 NOTE — Progress Notes (Signed)
Pt ready for discharge and daughter is to come and pick pt up after 5 pm.  Will go over DC instructions once daughter, April is here.

## 2017-02-18 NOTE — Progress Notes (Signed)
   Subjective: Patient was sitting on the side of his bed when seen this morning, he was not answering any questions.  He was discharged yesterday but family never picked him up, it looks like from Child psychotherapistsocial worker note that they might want home health care vs hospice at this time.  Objective:  Vital signs in last 24 hours: Vitals:   02/17/17 1328 02/17/17 2119 02/18/17 0135 02/18/17 0550  BP: (!) 111/56 132/65 135/62 135/65  Pulse: 99 93 90 95  Resp:  18 20 19   Temp: 98.6 F (37 C) 98.5 F (36.9 C) 98.8 F (37.1 C) 98.9 F (37.2 C)  TempSrc: Oral Oral    SpO2: (!) 89% 96% 94% 91%  Weight:      Height:       General.  Well-developed elderly man, in no acute distress. Lungs.  Bilateral basal crackles. CVS.  Regular rate and rhythm. Extremities.  Trace to 1+ bilateral lower extremity edema, little more on right.  Assessment/Plan:  Acute on chronic HFpEF w/hypoxemia on 2L [place] at home Pt with emphysematous disease throughout the lungs but also has large pleural efffusions secondary to worsening heart failure. Patient had a goals of care conversation with palliative care and decided to go home with hospice, recently noted from social worker note that either him or his daughter changing their mind and might go home with home health. -He was discharged home on home dose of Lasix, along with increased oxygen requirement to 4 L. Patient is still waiting for his right-his daughter wants a home health nurse to stay with him all the time before she can pick him up.  T2DM  Poorly controlled last A1C 11.3 in December 2018 -SSI moderate  Anemia of CKD Chronic, stable, on iron supplementation, weekly epo -continue iron, epo  CKD IV w/proteinuria.  Stable.  Phimosis -contacted urology, will not come by and see pt unless completely obstructed -made outpatient appointment for 02/28/17 for circumcision  Dispo: Was discharged yesterday-still waiting for a family member to pick him  up.  Arnetha CourserAmin, Astou Lada, MD 02/18/2017, 10:17 AM Pager: [phone]

## 2017-02-18 NOTE — Progress Notes (Signed)
Called April, daughter, and informed her that the pt has an appt on 2/19 with [facility] (she had requested information for pt about circumcision).

## 2017-02-18 NOTE — Progress Notes (Signed)
Called and left message for Ross Furlongshley Goldean, Care management about call I received from April Ardelean that pt wants to go home with Home Health vs. Hospice care.

## 2017-02-18 NOTE — Progress Notes (Signed)
Medicine attending discharge note: I examined this patient today together with resident physician Dr. Arnetha CourserSumayya Amin and I concur with her evaluation and management plan which we discussed together. 72 year old man well-known to our service admitted on February 1 with acute on chronic hypoxic respiratory failure. His main issue is advanced, oxygen dependent, emphysema although CT chest findings as reviewed in the attending admission note by Dr. Josem KaufmannKlima suggest other potential etiologies of his lung disease including interstitial fibrosis or alveolar proteinosis.  Situation further complicated by stage IV renal insufficiency.  There may be a component of diastolic cardiac dysfunction, grade 1 on echocardiogram done December 2018.  BNP this admission 135.5 similar to value from last month of 148.  He presented with increasing dyspnea with low oxygen saturation on 2 L of oxygen.  Chest radiograph showed bilateral basilar consolidation which had progressed compared with a January 3 x-ray.  Kerly B lines noted suggesting an element of congestive failure.  No acute EKG changes. He was treated with with increasing concentrations of oxygen up to 4 L. Parenteral diuretics with transition to oral diuretics at discharge. He returned to his previous tenuous baseline status. Additional problem identified during this admission was phimosis.  Urology referral made.  Disposition: Condition stable enough for discharge but overall unstable with anticipated recurrent hospitalizations due to the end-stage nature of his lung disease. He will continue to follow in our general medicine clinic There were no complications

## 2017-02-18 NOTE — Care Management (Addendum)
Pt was discharged yesterday but family did not take patient home because daughter was sick and could not provide care.  Spoke with daughter, April, who states she and patient have decided they want HH services resumed [facility] and want RN, NA, PT, OT services.  They state they do not believe pt is ready for palliative care and want to continue therapies.  Daughter states pt is home alone during the day and would benefit from different disciplines visiting.  Daughter states she and family are working on a rotating schedule to stay with pt intermittently.  Daughter states pt has all equipment he needs.  Daughter will bring portable tank and pick pt up this afternoon.    [facility] Nelson ChimesAmin [facility]  She will place D/C order and HH orders.  [facility] contacted to resume care and [facility] contacted to d/c care.

## 2017-02-19 DIAGNOSIS — N400 Enlarged prostate without lower urinary tract symptoms: Secondary | ICD-10-CM | POA: Diagnosis not present

## 2017-02-19 DIAGNOSIS — Z8701 Personal history of pneumonia (recurrent): Secondary | ICD-10-CM | POA: Diagnosis not present

## 2017-02-19 DIAGNOSIS — R1312 Dysphagia, oropharyngeal phase: Secondary | ICD-10-CM | POA: Diagnosis not present

## 2017-02-19 DIAGNOSIS — D631 Anemia in chronic kidney disease: Secondary | ICD-10-CM | POA: Diagnosis not present

## 2017-02-19 DIAGNOSIS — E785 Hyperlipidemia, unspecified: Secondary | ICD-10-CM | POA: Diagnosis not present

## 2017-02-19 DIAGNOSIS — I129 Hypertensive chronic kidney disease with stage 1 through stage 4 chronic kidney disease, or unspecified chronic kidney disease: Secondary | ICD-10-CM | POA: Diagnosis not present

## 2017-02-19 DIAGNOSIS — L89322 Pressure ulcer of left buttock, stage 2: Secondary | ICD-10-CM | POA: Diagnosis not present

## 2017-02-19 DIAGNOSIS — N184 Chronic kidney disease, stage 4 (severe): Secondary | ICD-10-CM | POA: Diagnosis not present

## 2017-02-19 DIAGNOSIS — E1122 Type 2 diabetes mellitus with diabetic chronic kidney disease: Secondary | ICD-10-CM | POA: Diagnosis not present

## 2017-02-20 ENCOUNTER — Inpatient Hospital Stay (HOSPITAL_COMMUNITY)
Admit: 2017-02-20 | Discharge: 2017-02-20 | Disposition: A | Discharge status: Home / Self Care | Payer: Medicare HMO | Attending: Nephrology | Admitting: Nephrology

## 2017-02-20 ENCOUNTER — Encounter (HOSPITAL_COMMUNITY): Payer: Self-pay

## 2017-02-21 ENCOUNTER — Telehealth: Payer: Self-pay | Admitting: *Deleted

## 2017-02-21 DIAGNOSIS — R1312 Dysphagia, oropharyngeal phase: Secondary | ICD-10-CM | POA: Diagnosis not present

## 2017-02-21 DIAGNOSIS — E1122 Type 2 diabetes mellitus with diabetic chronic kidney disease: Secondary | ICD-10-CM | POA: Diagnosis not present

## 2017-02-21 DIAGNOSIS — I129 Hypertensive chronic kidney disease with stage 1 through stage 4 chronic kidney disease, or unspecified chronic kidney disease: Secondary | ICD-10-CM | POA: Diagnosis not present

## 2017-02-21 DIAGNOSIS — Z8701 Personal history of pneumonia (recurrent): Secondary | ICD-10-CM | POA: Diagnosis not present

## 2017-02-21 DIAGNOSIS — E785 Hyperlipidemia, unspecified: Secondary | ICD-10-CM | POA: Diagnosis not present

## 2017-02-21 DIAGNOSIS — N184 Chronic kidney disease, stage 4 (severe): Secondary | ICD-10-CM | POA: Diagnosis not present

## 2017-02-21 DIAGNOSIS — D631 Anemia in chronic kidney disease: Secondary | ICD-10-CM | POA: Diagnosis not present

## 2017-02-21 DIAGNOSIS — N400 Enlarged prostate without lower urinary tract symptoms: Secondary | ICD-10-CM | POA: Diagnosis not present

## 2017-02-21 DIAGNOSIS — L89322 Pressure ulcer of left buttock, stage 2: Secondary | ICD-10-CM | POA: Diagnosis not present

## 2017-02-21 NOTE — Telephone Encounter (Signed)
[facility] advanced, alexis calls and states when she arrived at pt home his home 02 was on 2l/min [place], his 02 sat was below 90, confirmed 02 was to be on 4l/min and she reset level to 4, after appr 4 mins level was at 90 and rising. Pt stated he was breathing easier. She states as we reviewed med list that his insulin had been d'cd at hosp disch, I read to her that SSI was to continue. Can you confirm this or give new order for insulin?  Alexis [phone]

## 2017-02-21 NOTE — Telephone Encounter (Signed)
I spoke with Ross Bauer, and let her know that I was informed that when the patient was discharged the family and patient told Ross Bauer that they were not comfortable using insulin at home.  They were instructed how to use the insulin by our nurse before discharge which was originally scheduled for Friday February 8th but changed their mind the following day.  We will continue to monitor for now his blood sugars have been 150's to 160's per home health.  We will always re prescribe if they change their mind and feel comfortable trying the insulin.

## 2017-02-22 ENCOUNTER — Telehealth: Payer: Self-pay | Admitting: Internal Medicine

## 2017-02-22 DIAGNOSIS — I129 Hypertensive chronic kidney disease with stage 1 through stage 4 chronic kidney disease, or unspecified chronic kidney disease: Secondary | ICD-10-CM | POA: Diagnosis not present

## 2017-02-22 DIAGNOSIS — E1122 Type 2 diabetes mellitus with diabetic chronic kidney disease: Secondary | ICD-10-CM | POA: Diagnosis not present

## 2017-02-22 DIAGNOSIS — L89322 Pressure ulcer of left buttock, stage 2: Secondary | ICD-10-CM | POA: Diagnosis not present

## 2017-02-22 DIAGNOSIS — E785 Hyperlipidemia, unspecified: Secondary | ICD-10-CM | POA: Diagnosis not present

## 2017-02-22 DIAGNOSIS — Z8701 Personal history of pneumonia (recurrent): Secondary | ICD-10-CM | POA: Diagnosis not present

## 2017-02-22 DIAGNOSIS — R1312 Dysphagia, oropharyngeal phase: Secondary | ICD-10-CM | POA: Diagnosis not present

## 2017-02-22 DIAGNOSIS — N400 Enlarged prostate without lower urinary tract symptoms: Secondary | ICD-10-CM | POA: Diagnosis not present

## 2017-02-22 DIAGNOSIS — N184 Chronic kidney disease, stage 4 (severe): Secondary | ICD-10-CM | POA: Diagnosis not present

## 2017-02-22 DIAGNOSIS — D631 Anemia in chronic kidney disease: Secondary | ICD-10-CM | POA: Diagnosis not present

## 2017-02-22 NOTE — Telephone Encounter (Signed)
The goal O2 Saturation is 88-92%.  No higher.  Thank You.

## 2017-02-22 NOTE — Telephone Encounter (Signed)
[facility] [phone]; o2 level is up to 89 pt is on 4 liters.

## 2017-02-22 NOTE — Telephone Encounter (Signed)
rtc to Ross Bauer, lm for her to call back, [facility] she will f/u pt 2/14.  Dr Josem Kaufmannklima, are there any parameters for pt's 02 sats? It looks like on the 4 L/ min it is hovering around 89-90. Pt has no additional complaints. thanks

## 2017-02-23 DIAGNOSIS — E1122 Type 2 diabetes mellitus with diabetic chronic kidney disease: Secondary | ICD-10-CM | POA: Diagnosis not present

## 2017-02-23 DIAGNOSIS — D631 Anemia in chronic kidney disease: Secondary | ICD-10-CM | POA: Diagnosis not present

## 2017-02-23 DIAGNOSIS — R1312 Dysphagia, oropharyngeal phase: Secondary | ICD-10-CM | POA: Diagnosis not present

## 2017-02-23 DIAGNOSIS — Z8701 Personal history of pneumonia (recurrent): Secondary | ICD-10-CM | POA: Diagnosis not present

## 2017-02-23 DIAGNOSIS — I129 Hypertensive chronic kidney disease with stage 1 through stage 4 chronic kidney disease, or unspecified chronic kidney disease: Secondary | ICD-10-CM | POA: Diagnosis not present

## 2017-02-23 DIAGNOSIS — E785 Hyperlipidemia, unspecified: Secondary | ICD-10-CM | POA: Diagnosis not present

## 2017-02-23 DIAGNOSIS — N184 Chronic kidney disease, stage 4 (severe): Secondary | ICD-10-CM | POA: Diagnosis not present

## 2017-02-23 DIAGNOSIS — N400 Enlarged prostate without lower urinary tract symptoms: Secondary | ICD-10-CM | POA: Diagnosis not present

## 2017-02-23 DIAGNOSIS — L89322 Pressure ulcer of left buttock, stage 2: Secondary | ICD-10-CM | POA: Diagnosis not present

## 2017-02-23 NOTE — Telephone Encounter (Signed)
Yes.  Thank You. 

## 2017-02-23 NOTE — Telephone Encounter (Signed)
Spoke w/ Ross Bauer this am, gave her the parameters stated yesterday by you. She states that he is at 90% this am. He [facility] his blood sugar this am it is 224. He had not taken his morning meds yet but did take them from [facility]. She is asking for: HH social work for eval of community services for pt- meals on wheels and other services possible The family and pt are agreeable to pallative services and the csw would like to have someone from pallative care come to talk to them Do you agree?

## 2017-02-24 DIAGNOSIS — L89322 Pressure ulcer of left buttock, stage 2: Secondary | ICD-10-CM | POA: Diagnosis not present

## 2017-02-24 DIAGNOSIS — I129 Hypertensive chronic kidney disease with stage 1 through stage 4 chronic kidney disease, or unspecified chronic kidney disease: Secondary | ICD-10-CM | POA: Diagnosis not present

## 2017-02-24 DIAGNOSIS — E1122 Type 2 diabetes mellitus with diabetic chronic kidney disease: Secondary | ICD-10-CM | POA: Diagnosis not present

## 2017-02-24 DIAGNOSIS — N400 Enlarged prostate without lower urinary tract symptoms: Secondary | ICD-10-CM | POA: Diagnosis not present

## 2017-02-24 DIAGNOSIS — D631 Anemia in chronic kidney disease: Secondary | ICD-10-CM | POA: Diagnosis not present

## 2017-02-24 DIAGNOSIS — E785 Hyperlipidemia, unspecified: Secondary | ICD-10-CM | POA: Diagnosis not present

## 2017-02-24 DIAGNOSIS — R1312 Dysphagia, oropharyngeal phase: Secondary | ICD-10-CM | POA: Diagnosis not present

## 2017-02-24 DIAGNOSIS — Z8701 Personal history of pneumonia (recurrent): Secondary | ICD-10-CM | POA: Diagnosis not present

## 2017-02-24 DIAGNOSIS — N184 Chronic kidney disease, stage 4 (severe): Secondary | ICD-10-CM | POA: Diagnosis not present

## 2017-02-27 DIAGNOSIS — Z8701 Personal history of pneumonia (recurrent): Secondary | ICD-10-CM | POA: Diagnosis not present

## 2017-02-27 DIAGNOSIS — L89322 Pressure ulcer of left buttock, stage 2: Secondary | ICD-10-CM | POA: Diagnosis not present

## 2017-02-27 DIAGNOSIS — E1122 Type 2 diabetes mellitus with diabetic chronic kidney disease: Secondary | ICD-10-CM | POA: Diagnosis not present

## 2017-02-27 DIAGNOSIS — D631 Anemia in chronic kidney disease: Secondary | ICD-10-CM | POA: Diagnosis not present

## 2017-02-27 DIAGNOSIS — N184 Chronic kidney disease, stage 4 (severe): Secondary | ICD-10-CM | POA: Diagnosis not present

## 2017-02-27 DIAGNOSIS — N400 Enlarged prostate without lower urinary tract symptoms: Secondary | ICD-10-CM | POA: Diagnosis not present

## 2017-02-27 DIAGNOSIS — I129 Hypertensive chronic kidney disease with stage 1 through stage 4 chronic kidney disease, or unspecified chronic kidney disease: Secondary | ICD-10-CM | POA: Diagnosis not present

## 2017-02-27 DIAGNOSIS — R1312 Dysphagia, oropharyngeal phase: Secondary | ICD-10-CM | POA: Diagnosis not present

## 2017-02-27 DIAGNOSIS — E785 Hyperlipidemia, unspecified: Secondary | ICD-10-CM | POA: Diagnosis not present

## 2017-02-28 ENCOUNTER — Telehealth: Payer: Self-pay | Admitting: *Deleted

## 2017-02-28 DIAGNOSIS — E785 Hyperlipidemia, unspecified: Secondary | ICD-10-CM | POA: Diagnosis not present

## 2017-02-28 DIAGNOSIS — N184 Chronic kidney disease, stage 4 (severe): Secondary | ICD-10-CM | POA: Diagnosis not present

## 2017-02-28 DIAGNOSIS — N400 Enlarged prostate without lower urinary tract symptoms: Secondary | ICD-10-CM | POA: Diagnosis not present

## 2017-02-28 DIAGNOSIS — D631 Anemia in chronic kidney disease: Secondary | ICD-10-CM | POA: Diagnosis not present

## 2017-02-28 DIAGNOSIS — R1312 Dysphagia, oropharyngeal phase: Secondary | ICD-10-CM | POA: Diagnosis not present

## 2017-02-28 DIAGNOSIS — I129 Hypertensive chronic kidney disease with stage 1 through stage 4 chronic kidney disease, or unspecified chronic kidney disease: Secondary | ICD-10-CM | POA: Diagnosis not present

## 2017-02-28 DIAGNOSIS — L89322 Pressure ulcer of left buttock, stage 2: Secondary | ICD-10-CM | POA: Diagnosis not present

## 2017-02-28 DIAGNOSIS — E1122 Type 2 diabetes mellitus with diabetic chronic kidney disease: Secondary | ICD-10-CM | POA: Diagnosis not present

## 2017-02-28 DIAGNOSIS — Z8701 Personal history of pneumonia (recurrent): Secondary | ICD-10-CM | POA: Diagnosis not present

## 2017-02-28 NOTE — Telephone Encounter (Signed)
[facility] calls and states pt 02 sat when she arrived was 85%, pt was lying down at the time, after 15 mins it remains at 85%. Pt denies  More shortness of breath than usual, chest pain, weakness. Please advise [facility] [phone]

## 2017-02-28 NOTE — Telephone Encounter (Signed)
First of all, they can temporarily increase the O2 by 1 liter.  This will have to be temporary as over time it is uncomfortable and should be weaned to keep the O2 sat no greater than 92%  Secondly, he should get an additional dose of his lasix today.  Third, I need clarification as to where he and is family are on Hospice care.  If interested and this has not started we would need to try to get this process going.  Finally, If Ross Bauer has changed his mind about accessing the health care system, an [facility] for [facility]-evaluation as these issues are not well managed over the phone unless comfort care is the goal.

## 2017-03-01 ENCOUNTER — Other Ambulatory Visit (HOSPITAL_COMMUNITY): Payer: Self-pay | Admitting: *Deleted

## 2017-03-01 ENCOUNTER — Telehealth: Payer: Self-pay | Admitting: Internal Medicine

## 2017-03-01 NOTE — Telephone Encounter (Signed)
Rtc, lm for rtc 

## 2017-03-01 NOTE — Telephone Encounter (Signed)
Natalia LeatherwoodKatherine from [facility] [phone]; needs verbal orders therapy

## 2017-03-02 ENCOUNTER — Encounter (HOSPITAL_COMMUNITY): Payer: Self-pay

## 2017-03-02 ENCOUNTER — Telehealth: Payer: Self-pay | Admitting: *Deleted

## 2017-03-02 ENCOUNTER — Encounter (HOSPITAL_COMMUNITY): Payer: Medicare HMO

## 2017-03-02 DIAGNOSIS — Z8701 Personal history of pneumonia (recurrent): Secondary | ICD-10-CM | POA: Diagnosis not present

## 2017-03-02 DIAGNOSIS — L89322 Pressure ulcer of left buttock, stage 2: Secondary | ICD-10-CM | POA: Diagnosis not present

## 2017-03-02 DIAGNOSIS — N400 Enlarged prostate without lower urinary tract symptoms: Secondary | ICD-10-CM | POA: Diagnosis not present

## 2017-03-02 DIAGNOSIS — I129 Hypertensive chronic kidney disease with stage 1 through stage 4 chronic kidney disease, or unspecified chronic kidney disease: Secondary | ICD-10-CM | POA: Diagnosis not present

## 2017-03-02 DIAGNOSIS — N184 Chronic kidney disease, stage 4 (severe): Secondary | ICD-10-CM | POA: Diagnosis not present

## 2017-03-02 DIAGNOSIS — D631 Anemia in chronic kidney disease: Secondary | ICD-10-CM | POA: Diagnosis not present

## 2017-03-02 DIAGNOSIS — E1122 Type 2 diabetes mellitus with diabetic chronic kidney disease: Secondary | ICD-10-CM | POA: Diagnosis not present

## 2017-03-02 DIAGNOSIS — E785 Hyperlipidemia, unspecified: Secondary | ICD-10-CM | POA: Diagnosis not present

## 2017-03-02 DIAGNOSIS — R1312 Dysphagia, oropharyngeal phase: Secondary | ICD-10-CM | POA: Diagnosis not present

## 2017-03-02 NOTE — Telephone Encounter (Signed)
OT for 2x week for 3 weeks, for safety, self care, strengthening, VO given, do you agree?

## 2017-03-02 NOTE — Telephone Encounter (Signed)
[facility] and states pt is cont wearing 02, SAT at visit is 86%, bp 130/60 hr 96, lungs diminished throughout. Pt has duonebs left from facilty stay but does not have a nebulizer, she would like to know if you would like for him to use nebs, if so will you send an order to [facility] [phone]. Next HHN visit 2/26. Pt scheduled to see dr Josem Kaufmannklima 3/22, was offered earlier appt but family refused Ross Bauer [phone]

## 2017-03-02 NOTE — Telephone Encounter (Signed)
Ross Bauer issues are not appropriate for management over the phone.  At discharge he was very volume overloaded, but not interested in further diuresis.  When we had palliative care meet with him he kept saying we were "wasting his time".  I therefore do not believe a nebulizer machine will meet his needs.    I agree that he needs to be seen now, and we will likely recommend admission for further diuresis Vs hospice, if he remains uninterested in the former.  Another option is for the patient and family to seek a physician who makes house calls if getting to the clinic is not possible to assure he gets the necessary assessment so appropriate care can be recommended.

## 2017-03-02 NOTE — Telephone Encounter (Signed)
Yes, but only if the patient is willing to cooperate with OT.  Thank You.

## 2017-03-03 NOTE — Telephone Encounter (Signed)
Message given to nickey, pt does not answer ph

## 2017-03-03 NOTE — Telephone Encounter (Signed)
I also f/u with nickey today just to see where we stand she will not see him again until next week, she had called to inform him of taking dose of lasix, she is not quite sure he took it as things stood yesterday, family has continued to refuse an earlier appt and they are discussing hospice, she will call while at the next visit to see if we can determine these items. She does state that pt is hard to work with and it is stressful.

## 2017-03-04 ENCOUNTER — Telehealth: Payer: Self-pay | Admitting: Internal Medicine

## 2017-03-04 MED ORDER — INSULIN ASPART 100 UNIT/ML ~~LOC~~ SOLN: SUBCUTANEOUS | 11 refills | Status: DC

## 2017-03-04 NOTE — Telephone Encounter (Signed)
   Reason for call:   I received a call from [name] 's daughter at 1:45 PM with further questions about patient's insulin.    Pertinent Data:   Patient unsure whether to administer long or short acting insulin   Assessment / Plan / Recommendations:   Per recent discharge summary, patient should be on short acting insulin alone. Instructed her not to use the long acting.  As always, pt is advised that if symptoms worsen or new symptoms arise, they should go to an urgent care facility or to to ER for further evaluation.   Ross Bauer, Ross Mahajan, MD   03/04/2017, 1:51 PM

## 2017-03-04 NOTE — Telephone Encounter (Signed)
   Reason for call:   I received a call from [name]'s daughter at 10:50 AM indicating that patient's CBG was 299, that he was out of insulin, and also refusing injections.   Pertinent Data:   Called patient first who gave permission to speak with daughter   Daughter (Ross Bauer) spoke with patient who is now willing to take the insulin if administered by her or her brother, but does not want to self inject.   Daughter is willing to provide injections    Assessment / Plan / Recommendations:   Refilled Novolog SSI, sent prescription to [facility] on [address]  Encouraged compliance   As always, pt is advised that if symptoms worsen or new symptoms arise, they should go to an urgent care facility or to to ER for further evaluation.   Reymundo PollGuilloud, Tighe Gitto, MD   03/04/2017, 10:58 AM

## 2017-03-05 ENCOUNTER — Emergency Department (HOSPITAL_COMMUNITY): Payer: Medicare HMO

## 2017-03-05 ENCOUNTER — Other Ambulatory Visit: Payer: Self-pay

## 2017-03-05 ENCOUNTER — Encounter (HOSPITAL_COMMUNITY): Payer: Self-pay | Admitting: Emergency Medicine

## 2017-03-05 ENCOUNTER — Observation Stay (HOSPITAL_COMMUNITY)
Admission: EM | Admit: 2017-03-05 | Discharge: 2017-03-05 | Disposition: A | Discharge status: Home / Self Care | Payer: Medicare HMO | Attending: Internal Medicine | Admitting: Internal Medicine

## 2017-03-05 DIAGNOSIS — T383X1A Poisoning by insulin and oral hypoglycemic [antidiabetic] drugs, accidental (unintentional), initial encounter: Secondary | ICD-10-CM | POA: Diagnosis not present

## 2017-03-05 DIAGNOSIS — Z87891 Personal history of nicotine dependence: Secondary | ICD-10-CM | POA: Diagnosis not present

## 2017-03-05 DIAGNOSIS — E162 Hypoglycemia, unspecified: Secondary | ICD-10-CM | POA: Diagnosis not present

## 2017-03-05 DIAGNOSIS — E11649 Type 2 diabetes mellitus with hypoglycemia without coma: Secondary | ICD-10-CM | POA: Diagnosis not present

## 2017-03-05 DIAGNOSIS — I130 Hypertensive heart and chronic kidney disease with heart failure and stage 1 through stage 4 chronic kidney disease, or unspecified chronic kidney disease: Secondary | ICD-10-CM

## 2017-03-05 DIAGNOSIS — Z7982 Long term (current) use of aspirin: Secondary | ICD-10-CM | POA: Diagnosis not present

## 2017-03-05 DIAGNOSIS — D631 Anemia in chronic kidney disease: Secondary | ICD-10-CM | POA: Diagnosis not present

## 2017-03-05 DIAGNOSIS — Z79899 Other long term (current) drug therapy: Secondary | ICD-10-CM | POA: Diagnosis not present

## 2017-03-05 DIAGNOSIS — N184 Chronic kidney disease, stage 4 (severe): Secondary | ICD-10-CM | POA: Diagnosis not present

## 2017-03-05 DIAGNOSIS — Z794 Long term (current) use of insulin: Secondary | ICD-10-CM | POA: Diagnosis not present

## 2017-03-05 DIAGNOSIS — I5030 Unspecified diastolic (congestive) heart failure: Secondary | ICD-10-CM

## 2017-03-05 DIAGNOSIS — Z9981 Dependence on supplemental oxygen: Secondary | ICD-10-CM | POA: Diagnosis not present

## 2017-03-05 DIAGNOSIS — E785 Hyperlipidemia, unspecified: Secondary | ICD-10-CM | POA: Diagnosis not present

## 2017-03-05 DIAGNOSIS — J810 Acute pulmonary edema: Secondary | ICD-10-CM | POA: Diagnosis not present

## 2017-03-05 DIAGNOSIS — I5032 Chronic diastolic (congestive) heart failure: Secondary | ICD-10-CM | POA: Diagnosis not present

## 2017-03-05 DIAGNOSIS — J90 Pleural effusion, not elsewhere classified: Secondary | ICD-10-CM

## 2017-03-05 DIAGNOSIS — J449 Chronic obstructive pulmonary disease, unspecified: Secondary | ICD-10-CM | POA: Insufficient documentation

## 2017-03-05 DIAGNOSIS — N400 Enlarged prostate without lower urinary tract symptoms: Secondary | ICD-10-CM | POA: Diagnosis not present

## 2017-03-05 DIAGNOSIS — Z888 Allergy status to other drugs, medicaments and biological substances status: Secondary | ICD-10-CM

## 2017-03-05 DIAGNOSIS — E1122 Type 2 diabetes mellitus with diabetic chronic kidney disease: Secondary | ICD-10-CM | POA: Insufficient documentation

## 2017-03-05 DIAGNOSIS — T68XXXA Hypothermia, initial encounter: Secondary | ICD-10-CM | POA: Diagnosis not present

## 2017-03-05 DIAGNOSIS — E11319 Type 2 diabetes mellitus with unspecified diabetic retinopathy without macular edema: Secondary | ICD-10-CM | POA: Diagnosis not present

## 2017-03-05 DIAGNOSIS — R0902 Hypoxemia: Secondary | ICD-10-CM | POA: Diagnosis not present

## 2017-03-05 DIAGNOSIS — J9621 Acute and chronic respiratory failure with hypoxia: Secondary | ICD-10-CM | POA: Diagnosis present

## 2017-03-05 DIAGNOSIS — E161 Other hypoglycemia: Secondary | ICD-10-CM | POA: Diagnosis not present

## 2017-03-05 LAB — BASIC METABOLIC PANEL
Anion gap: 11 (ref 5–15)
Anion gap: 10 (ref 5–15)
BUN: 41 mg/dL — AB (ref 6–20)
BUN: 41 mg/dL — ABNORMAL HIGH (ref 6–20)
CHLORIDE: 105 mmol/L (ref 101–111)
CO2: 24 mmol/L (ref 22–32)
CO2: 24 mmol/L (ref 22–32)
Calcium: 8.6 mg/dL — ABNORMAL LOW (ref 8.9–10.3)
Calcium: 8.6 mg/dL — ABNORMAL LOW (ref 8.9–10.3)
Chloride: 104 mmol/L (ref 101–111)
Creatinine, Ser: 2.85 mg/dL — ABNORMAL HIGH (ref 0.61–1.24)
Creatinine, Ser: 2.71 mg/dL — ABNORMAL HIGH (ref 0.61–1.24)
GFR calc Af Amer: 24 mL/min — ABNORMAL LOW (ref >60)
GFR calc Af Amer: 25 mL/min — ABNORMAL LOW (ref >60)
GFR calc non Af Amer: 20 mL/min — ABNORMAL LOW (ref >60)
GFR calc non Af Amer: 22 mL/min — ABNORMAL LOW (ref >60)
GLUCOSE: 106 mg/dL — AB (ref 65–99)
GLUCOSE: 68 mg/dL (ref 65–99)
POTASSIUM: 3.4 mmol/L — AB (ref 3.5–5.1)
Potassium: 3.2 mmol/L — ABNORMAL LOW (ref 3.5–5.1)
Sodium: 139 mmol/L (ref 135–145)
Sodium: 139 mmol/L (ref 135–145)

## 2017-03-05 LAB — GLUCOSE, CAPILLARY
Glucose-Capillary: 282 mg/dL — ABNORMAL HIGH (ref 65–99)
Glucose-Capillary: 222 mg/dL — ABNORMAL HIGH (ref 65–99)

## 2017-03-05 LAB — CBC
HEMATOCRIT: 35.3 % — AB (ref 39.0–52.0)
HEMOGLOBIN: 11.1 g/dL — AB (ref 13.0–17.0)
MCH: 29.1 pg (ref 26.0–34.0)
MCHC: 31.4 g/dL (ref 30.0–36.0)
MCV: 92.7 fL (ref 78.0–100.0)
Platelets: 242 10*3/uL (ref 150–400)
RBC: 3.81 MIL/uL — AB (ref 4.22–5.81)
RDW: 14.7 % (ref 11.5–15.5)
WBC: 8.6 10*3/uL (ref 4.0–10.5)

## 2017-03-05 LAB — CBG MONITORING, ED
GLUCOSE-CAPILLARY: 119 mg/dL — AB (ref 65–99)
GLUCOSE-CAPILLARY: 86 mg/dL (ref 65–99)
GLUCOSE-CAPILLARY: 156 mg/dL — AB (ref 65–99)
GLUCOSE-CAPILLARY: 127 mg/dL — AB (ref 65–99)
GLUCOSE-CAPILLARY: 150 mg/dL — AB (ref 65–99)
Glucose-Capillary: 40 mg/dL — CL (ref 65–99)
Glucose-Capillary: 75 mg/dL (ref 65–99)
Glucose-Capillary: 77 mg/dL (ref 65–99)
Glucose-Capillary: 131 mg/dL — ABNORMAL HIGH (ref 65–99)
Glucose-Capillary: 112 mg/dL — ABNORMAL HIGH (ref 65–99)

## 2017-03-05 LAB — CBC WITH DIFFERENTIAL/PLATELET
Basophils Absolute: 0 10*3/uL (ref 0.0–0.1)
Basophils Relative: 0 %
Eosinophils Absolute: 0.1 10*3/uL (ref 0.0–0.7)
Eosinophils Relative: 2 %
HEMATOCRIT: 38.2 % — AB (ref 39.0–52.0)
Hemoglobin: 12 g/dL — ABNORMAL LOW (ref 13.0–17.0)
LYMPHS ABS: 1 10*3/uL (ref 0.7–4.0)
Lymphocytes Relative: 12 %
MCH: 29 pg (ref 26.0–34.0)
MCHC: 31.4 g/dL (ref 30.0–36.0)
MCV: 92.3 fL (ref 78.0–100.0)
MONO ABS: 0.4 10*3/uL (ref 0.1–1.0)
MONOS PCT: 5 %
NEUTROS ABS: 6.2 10*3/uL (ref 1.7–7.7)
Neutrophils Relative %: 81 %
PLATELETS: 221 10*3/uL (ref 150–400)
RBC: 4.14 MIL/uL — ABNORMAL LOW (ref 4.22–5.81)
RDW: 14.7 % (ref 11.5–15.5)
WBC: 7.7 10*3/uL (ref 4.0–10.5)

## 2017-03-05 LAB — I-STAT ARTERIAL BLOOD GAS, ED
ACID-BASE DEFICIT: 3 mmol/L — AB (ref 0.0–2.0)
BICARBONATE: 24.1 mmol/L (ref 20.0–28.0)
O2 Saturation: 91 %
PH ART: 7.295 — AB (ref 7.350–7.450)
PO2 ART: 69 mmHg — AB (ref 83.0–108.0)
TCO2: 26 mmol/L (ref 22–32)
pCO2 arterial: 49.6 mmHg — ABNORMAL HIGH (ref 32.0–48.0)

## 2017-03-05 LAB — BRAIN NATRIURETIC PEPTIDE: B Natriuretic Peptide: 37.7 pg/mL (ref 0.0–100.0)

## 2017-03-05 MED ORDER — FUROSEMIDE 10 MG/ML IJ SOLN
40.0000 mg | Freq: Once | INTRAMUSCULAR | Status: AC
  Administered 2017-03-05: 40 mg via INTRAVENOUS
  Filled 2017-03-05: qty 4

## 2017-03-05 MED ORDER — AMLODIPINE BESYLATE 10 MG PO TABS
10.0000 mg | ORAL_TABLET | Freq: Every day | ORAL | Status: DC
  Administered 2017-03-05: 10 mg via ORAL
  Filled 2017-03-05: qty 1

## 2017-03-05 MED ORDER — DEXTROSE 50 % IV SOLN: 1.0000 | INTRAVENOUS | Status: DC | PRN

## 2017-03-05 MED ORDER — POTASSIUM CHLORIDE 10 MEQ/100ML IV SOLN
10.0000 meq | INTRAVENOUS | Status: AC
  Administered 2017-03-05 (×5): 10 meq via INTRAVENOUS
  Filled 2017-03-05 (×5): qty 100

## 2017-03-05 MED ORDER — DEXTROSE 50 % IV SOLN
INTRAVENOUS | Status: AC
  Administered 2017-03-05: 02:00:00
  Filled 2017-03-05: qty 50

## 2017-03-05 MED ORDER — FERROUS SULFATE 325 (65 FE) MG PO TABS
325.0000 mg | ORAL_TABLET | Freq: Every day | ORAL | Status: DC
  Administered 2017-03-05: 325 mg via ORAL
  Filled 2017-03-05 (×2): qty 1

## 2017-03-05 MED ORDER — DEXTROSE 10 % IV SOLN
INTRAVENOUS | Status: DC
  Administered 2017-03-05: 02:00:00 via INTRAVENOUS

## 2017-03-05 MED ORDER — DEXTROSE 50 % IV SOLN
1.0000 | Freq: Once | INTRAVENOUS | Status: AC
  Administered 2017-03-05: 50 mL via INTRAVENOUS
  Filled 2017-03-05: qty 50

## 2017-03-05 MED ORDER — ENOXAPARIN SODIUM 30 MG/0.3ML ~~LOC~~ SOLN: 30.0000 mg | SUBCUTANEOUS | Status: DC

## 2017-03-05 MED ORDER — ASPIRIN EC 81 MG PO TBEC
81.0000 mg | DELAYED_RELEASE_TABLET | Freq: Every day | ORAL | Status: DC
  Administered 2017-03-05: 81 mg via ORAL
  Filled 2017-03-05: qty 1

## 2017-03-05 MED ORDER — ALBUTEROL SULFATE (2.5 MG/3ML) 0.083% IN NEBU: 2.5000 mg | INHALATION_SOLUTION | Freq: Four times a day (QID) | RESPIRATORY_TRACT | Status: DC | PRN

## 2017-03-05 NOTE — Discharge Summary (Signed)
Name: Ross Bauer MRN: [id number] DOB: July 12, 1945 72 y.o. PCP: Doneen Poisson, MD  Date of Admission: 03/05/2017  1:24 AM Date of Discharge: 03/05/2017 Attending Physician: Anne Shutter, MD  Discharge Diagnosis: 1.  Hypoglycemia.  Principal Problem:   Hypoglycemia Active Problems:   Type 2 diabetes mellitus with stage 4 chronic kidney disease (HCC)   Acute on chronic respiratory failure with hypoxia [place] [facility])   Discharge Medications: Allergies as of 03/05/2017      Reactions   Ace Inhibitors    REACTION: intolerance      Medication List    TAKE these medications   albuterol 108 (90 Base) MCG/ACT inhaler Commonly known as:  PROVENTIL HFA;VENTOLIN HFA Inhale 2 puffs into the lungs every 6 (six) hours as needed for wheezing or shortness of breath.   amLODipine 10 MG tablet Commonly known as:  NORVASC Take 1 tablet (10 mg total) daily by mouth.   aspirin 81 MG tablet Take 1 tablet (81 mg total) by mouth daily.   Darbepoetin Alfa 200 MCG/0.4ML Sosy injection Commonly known as:  ARANESP Inject 0.4 mLs (200 mcg total) into the skin every Sunday at 6pm.   feeding supplement (NEPRO CARB STEADY) Liqd Take 237 mLs by mouth 3 (three) times daily between meals.   ferrous sulfate 325 (65 FE) MG tablet Take 1 tablet (325 mg total) by mouth daily with breakfast.   furosemide 40 MG tablet Commonly known as:  LASIX Take 1 tablet (40 mg total) by mouth daily.   insulin aspart 100 UNIT/ML injection Commonly known as:  novoLOG Use Sliding Scale 3 times daily with meals CBG 70-120 use 0 units CBG 121-150 use 2 units CBG 151-200 use 3 units CBG 201-250 use 5 units CBG 251-300 use 8 units CBG 301-350 use 11 units CBG 351-400 use 15 units       Disposition and follow-up:   Mr.Lian L Phung was [facility] from [facility] in Good condition.  At the hospital follow up visit please address:  1.  His insulin requirement and how to calculate and inject  proper dosage.  2.  Labs / imaging needed at time of follow-up: None  3.  Pending labs/ test needing follow-up: None  Follow-up Appointments:   Hospital Course by problem list:  Ross Bauer is a 72 y.o male with DM, HFpEF, HTN, COPD on home oxygen, and CKD Stage IV who presented to the ED with hypoglycemiaafter getting 100 units of Novolog around21:00 on 2/23 instead of 10.  Hypoglycemia.   He was diaphoretic with CBG of 40, initially treated with D50, responded well for a short time and then had another drop in blood sugar, placed on D10 for a few hours, he continued to maintain his blood sugar in the low 200s after few hours of stopping the D10.  He was discharged home with instructions to have a close follow-up with PCP.  He will need more education regarding how to calculate and properly inject insulin.  He was also advised to bring her caregiver who will be giving him injections with him to that appointment.  HFpEF.  Patient with recent admission due to heart failure exacerbation.  His symptoms and volume was very hard to control.  Pulmonary, cardiology and nephrology was consulted during that hospitalization and they all recommended palliative care.  Patient currently do not want to involve hospice and wants to stay home with home health. During current hospitalization he was placed on BiPAP for a short period  of time because of his complaint of worsening shortness of breath and was given 1 dose of IV Lasix 40 mg.  His breathing status improved and he was discharged on his home dose of Lasix.  COPD on Home Oxygen.  No other sign of acute exacerbation.  He can resume his home meds on discharge.  CKD.  Creatinine at baseline.  Hypertension.  Blood pressure remained at goal and he was instructed to continue home dose of amlodipine.  Discharge Vitals:   BP (!) 158/93 (BP Location: Right Arm)   Pulse 96   Temp 98 F (36.7 C) (Oral)   Resp (!) 24   Ht 5\' 6"  (1.676 m)   Wt 140 lb  (63.5 kg)   SpO2 98%   BMI 22.60 kg/m    General.  Well-developed elderly man, in no acute distress. Lungs.  Decreased breath sounds bilaterally. CV.  Lower rate and rhythm with distant heart sound. Abdomen.  Soft, nontender, bowel sounds positive. Extremities.  Trace lower extremity edema bilaterally.  Pertinent Labs, Studies, and Procedures:  Recent Labs  Lab 03/05/17 0604 03/05/17 0655 03/05/17 0732 03/05/17 1108 03/05/17 1609  GLUCAP 112* 127* 150* 282* 222*   CBC Latest Ref Rng & Units 03/05/2017 03/05/2017 02/11/2017  WBC 4.0 - 10.5 K/uL 8.6 7.7 7.3  Hemoglobin 13.0 - 17.0 g/dL 11.1(L) 12.0(L) 9.7(L)  Hematocrit 39.0 - 52.0 % 35.3(L) 38.2(L) 30.9(L)  Platelets 150 - 400 K/uL 242 221 315   BMP Latest Ref Rng & Units 03/05/2017 03/05/2017 02/17/2017  Glucose 65 - 99 mg/dL 68 914(N106(H) -  BUN 6 - 20 mg/dL 82(N41(H) 56(O41(H) -  Creatinine 0.61 - 1.24 mg/dL 1.30(Q2.71(H) 6.57(Q2.85(H) 4.69(G3.08(H)  BUN/Creat Ratio 10 - 24 - - -  Sodium 135 - 145 mmol/L 139 139 -  Potassium 3.5 - 5.1 mmol/L 3.4(L) 3.2(L) -  Chloride 101 - 111 mmol/L 105 104 -  CO2 22 - 32 mmol/L 24 24 -  Calcium 8.9 - 10.3 mg/dL 2.9(B8.6(L) 2.8(U8.6(L) -   Discharge Instructions: Discharge Instructions    Diet - low sodium heart healthy   Complete by:  As directed    Discharge instructions   Complete by:  As directed    It was pleasure taking care of you. Please make sure you have someone who can give you insulin according to your sliding scale, please do not take any more than you need. Continue taking all of your medications as directed. Please follow-up with your PCP within 1 week.   Increase activity slowly   Complete by:  As directed       Signed: Arnetha CourserAmin, Kwamaine Cuppett, MD 03/06/2017, 10:01 AM   Pager: [phone]

## 2017-03-05 NOTE — ED Notes (Signed)
Pt called out and was screaming "Man down". RN went into room to check on patient and found that he had taken the Bipap mask off. Pt was in acute distress and his oxygen was down to 80% RA. RNs assisted patient by putting bipap mask back on his face. He was also saying ":get me out of this bed". RNs assisted patient to sit on the side of the bed.

## 2017-03-05 NOTE — Progress Notes (Signed)
Discussed with the patient and all questioned fully answered. He will call me if any problems arise.  IV removed. Telemetry removed.  Discharge instructions discussed with daughter. Given paperwork in discharge envelope.   Given education on hypoglycemia, insulin, insulin storage. Given [employer] handout. Gave daughter a large printed version of sliding scale. Education with teachback. Daughter able to demonstrate appropriately drawing up 8unit for a blood sugar of 260.   Leonidas Rombergaitlin S Bumbledare, RN

## 2017-03-05 NOTE — ED Notes (Signed)
April (daughter) states she gave the insulin at 20:45-21:00 when patient last ate

## 2017-03-05 NOTE — Progress Notes (Signed)
Pt's daughter at bedside. Informed of discharge order. Daughter did not bring patient's home oxygen with her for transport. She called her son to bring. She is also going to have the son bring the insulin administration materials so that she can be taught how to safely administer insulin.   Leonidas Rombergaitlin S Bumbledare, RN

## 2017-03-05 NOTE — Progress Notes (Signed)
RN called d/t pt desat and increased WOB.  Came to room and found pt on 10 lpm [employer], sat 94-95%.  Pt w/ slight increased WOB.  Pt states he does not want to wear bipap.  Placed a humidified HFNC on pt (d/t pt already on 10 lpm).  Pt states this "helps", @ 13 lpm.    RN aware.  Sat 93-95%, no distress currently noted.

## 2017-03-05 NOTE — Progress Notes (Signed)
1610 pt CBG was 222. Pt on home 4L of O2. No respiratory distress. Pt denies needs. Requests to be discharged. MD paged.   Leonidas Rombergaitlin S Bumbledare, RN

## 2017-03-05 NOTE — ED Provider Notes (Addendum)
TIME SEEN: 1:45 AM  CHIEF COMPLAINT: Unintentional insulin overdose  HPI: Patient is a 72 year old male with history of hypertension, COPD on home oxygen, type 2 diabetes, chronic kidney disease, diastolic heart failure who presents to the emergency department with altered mental status, hypoglycemia.  Patient's daughter accidentally gave the patient 100 units of NovoLog around 8:45 PM last night.  Patient was just started on insulin and was supposed to receive 10 units.  Was found to be confused, diaphoretic.  Blood glucose with EMS was 40.  He was given 250 mL of D10 by EMS and his blood sugar improved to the 220s.  On recheck in the emergency department blood sugars in the 40s.  He states prior to today he has been feeling well.  No fevers, vomiting or diarrhea.  Has chronic cough.  Has chronic shortness of breath which is unchanged.  No chest pain or chest discomfort.  ROS: See HPI Constitutional: no fever  Eyes: no drainage  ENT: no runny nose   Cardiovascular:  no chest pain  Resp: no SOB  GI: no vomiting GU: no dysuria Integumentary: no rash  Allergy: no hives  Musculoskeletal: no leg swelling  Neurological: no slurred speech ROS otherwise negative  PAST MEDICAL HISTORY/PAST SURGICAL HISTORY:  Past Medical History:  Diagnosis Date  . Acute on chronic diastolic (congestive) heart failure (HCC)   . Acute on chronic respiratory failure (HCC)   . Anemia in chronic kidney disease 06/19/2009  . Background diabetic retinopathy associated with type 2 diabetes mellitus (HCC) 11/13/2013   Bilateral. Followed by ophthalmologist [name].  [name] BPH (benign prostatic hyperplasia) 09/30/2016  . Chronic back pain 05/09/2007   "all over" (01/02/2017)  . Erectile dysfunction associated with type 2 diabetes mellitus (HCC) 2001  . Essential hypertension   . Hyperlipidemia   . Onychomycosis of right great toe 06/01/2015  . Pneumonia 12/2016   CAP LLL 12/2016  . 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.  . Tobacco abuse 03/20/2007  . Type 2 diabetes mellitus with stage 4 chronic kidney disease (HCC)     MEDICATIONS:  Prior to Admission medications   Medication Sig Start Date End Date Taking? Authorizing Provider  albuterol (PROVENTIL HFA;VENTOLIN HFA) 108 (90 Base) MCG/ACT inhaler Inhale 2 puffs into the lungs every 6 (six) hours as needed for wheezing or shortness of breath. 01/14/17   Rozann Lesches, MD  amLODipine (NORVASC) 10 MG tablet Take 1 tablet (10 mg total) daily by mouth. 11/16/16   Doneen Poisson, MD  aspirin 81 MG tablet Take 1 tablet (81 mg total) by mouth daily. 04/30/13   Onnie Boer, MD  Darbepoetin Alfa (ARANESP) 200 MCG/0.4ML SOSY injection Inject 0.4 mLs (200 mcg total) into the skin every Sunday at 6pm. 01/15/17   Rozann Lesches, MD  ferrous sulfate 325 (65 FE) MG tablet Take 1 tablet (325 mg total) by mouth daily with breakfast. 01/15/17   Rozann Lesches, MD  furosemide (LASIX) 40 MG tablet Take 1 tablet (40 mg total) by mouth daily. 05/29/15   Courtney Paris, MD  insulin aspart (NOVOLOG) 100 UNIT/ML injection Use Sliding Scale 3 times daily with meals CBG 70-120 use 0 units CBG 121-150 use 2 units CBG 151-200 use 3 units CBG 201-250 use 5 units CBG 251-300 use 8 units CBG 301-350 use 11 units CBG 351-400 use 15 units 03/04/17   Reymundo Poll, MD  Nutritional Supplements (FEEDING SUPPLEMENT, NEPRO  CARB STEADY,) LIQD Take 237 mLs by mouth 3 (three) times daily between meals. 02/17/17   Angelita Ingles, MD  rosuvastatin (CRESTOR) 40 MG tablet Take 1 tablet (40 mg total) by mouth daily. 01/05/11 11/11/11  Kalia-Reynolds, Thompson Grayer, DO    ALLERGIES:  Allergies  Allergen Reactions  . Ace Inhibitors     REACTION: intolerance    SOCIAL HISTORY:  Social History   Tobacco Use  . Smoking status: Former Smoker    Packs/day: 0.12    Years: 50.00    Pack years: 6.00    Types:  Cigarettes    Last attempt to quit: 01/04/2017    Years since quitting: 0.1  . Smokeless tobacco: Never Used  Substance Use Topics  . Alcohol use: No    Alcohol/week: 0.0 oz    FAMILY HISTORY: Family History  Problem Relation Age of Onset  . Cancer Mother        died in 35s.  . Cancer Father        died in 41s  . Healthy Sister   . Healthy Brother   . Unexplained death Brother   . Healthy Daughter   . Healthy Daughter     EXAM: BP (!) 153/61 (BP Location: Right Arm)   Pulse 95   Temp (!) 94.3 F (34.6 C) (Oral)   Resp (!) 29   Ht 5\' 6"  (1.676 m)   Wt 63.5 kg (140 lb)   SpO2 (!) 88%   BMI 22.60 kg/m  CONSTITUTIONAL: Alert and oriented x4 and responds appropriately to questions.  In no distress, resting comfortably, elderly HEAD: Normocephalic EYES: Conjunctivae clear, pupils appear equal, EOMI ENT: normal nose; moist mucous membranes NECK: Supple, no meningismus, no nuchal rigidity, no LAD  CARD: RRR; S1 and S2 appreciated; no murmurs, no clicks, no rubs, no gallops RESP: Normal chest excursion without splinting or tachypnea; breath sounds clear and equal bilaterally; no wheezes, no rhonchi, no rales, sats in the upper 80s to low 90s on his chronic 3-1/2 L of oxygen nasal cannula but no respiratory distress, speaking full sentences ABD/GI: Normal bowel sounds; non-distended; soft, non-tender, no rebound, no guarding, no peritoneal signs, no hepatosplenomegaly BACK:  The back appears normal and is non-tender to palpation, there is no CVA tenderness EXT: Normal ROM in all joints; non-tender to palpation; no edema; normal capillary refill; no cyanosis, no calf tenderness or swelling    SKIN: Normal color for age and race; warm; no rash NEURO: Moves all extremities equally, cranial nerves II through XII intact, normal speech, normal sensation diffusely PSYCH: The patient's mood and manner are appropriate. Grooming and personal hygiene are appropriate.  MEDICAL DECISION  MAKING: Patient here with hypoglycemia.  This is secondary to unintentional insulin overdose.  Have discussed with poison control who has recommended medical admission with frequent blood sugar checks.  We have started the patient on dextrose 10% IV drip and will watch his blood sugar closely.  Will give ampules of D50 as needed.  He has had some mild hypoxia here but denies any change in his chronic shortness of breath.  His lungs are currently clear.  Will obtain chest x-ray.  ED PROGRESS: Labs unremarkable other than creatinine of 2.85 which is chronic.  Blood glucose went up to 117 and now is back to 75.  We have encouraged him to eat and drink.  Daughter now at bedside.  Chest x-ray shows no significant interval change in moderate bilateral pleural effusions and bibasilar consolidations.  He has continued cardiomegaly with vascular congestion and moderate pulmonary edema.  Given we are having to hydrate the patient with a D10 drip I will give dose of Lasix 40 mg IV.  Will discuss with medicine for admission.  2:53 AM Discussed patient's case with IM resident, Dr. Obie DredgeBlum.  I have recommended admission and patient (and family if present) agree with this plan. Admitting physician will place admission orders.    Patient's blood glucose currently stable.  He is eating and drinking.   I reviewed all nursing notes, vitals, pertinent previous records, EKGs, lab and urine results, imaging (as available).   3:00 AM  Called by patient's nurse for increasing respiratory complaints.  Patient now feeling short of breath and sitting on the side of the bed upright.  Sats 88% on nasal cannula.  No significant change in breath sounds.  Will obtain ABG and place him on BiPAP as he is likely volume overloaded.  It appears he came in volume overloaded but now that he is getting IV fluids to keep his glucose up this has likely worsened.  Discussed with family that unfortunately we need to continue the dextrose 10% drip.   We will decrease the rate is 150 mL/h down to 100 mL/h but need to watch his blood sugar more closely.  Recommended to the nurse that we check blood glucose every 15 minutes given now he cannot eat and drink on BiPAP.  Respiratory therapy at bedside.   3:15 AM  Pt's daughter now tells nursing staff and resident physicians that she gave the entire pen of NovoLog.  Patient's daughter seems very agitated.  I am not sure if this is just because of fever, anxiety and being worried about her father or if there was any possibility that this could have been intentional.  Because of her demeanor and now the patient seems very quiet and withdrawn around her, we will contact APS for further investigation.   Date: 03/05/2017 1:30 AM  Rate: 83  Rhythm: normal sinus rhythm  QRS Axis: normal  Intervals: borderline prolonged QT interval (QTc = 478 ms)  ST/T Wave abnormalities: normal  Conduction Disutrbances: none  Narrative Interpretation: unremarkable    CRITICAL CARE Performed by: Rochele RaringKristen Zyrah Wiswell   Total critical care time: 55 minutes  Critical care time was exclusive of separately billable procedures and treating other patients.  Critical care was necessary to treat or prevent imminent or life-threatening deterioration.  Critical care was time spent personally by me on the following activities: development of treatment plan with patient and/or surrogate as well as nursing, discussions with consultants, evaluation of patient's response to treatment, examination of patient, obtaining history from patient or surrogate, ordering and performing treatments and interventions, ordering and review of laboratory studies, ordering and review of radiographic studies, pulse oximetry and re-evaluation of patient's condition.      Advik Weatherspoon, Layla MawKristen N, DO 03/05/17 0253    Nalah Macioce, Layla MawKristen N, DO 03/05/17 0301    Lopez Dentinger, Layla MawKristen N, DO 03/05/17 [phone]

## 2017-03-05 NOTE — Progress Notes (Signed)
  Date: 03/05/2017  Patient name: Ross ManifoldJulius L Ourada  Medical record number: [id number]  Date of birth: 10/30/1945   I saw and evaluated the patient. I reviewed the resident's note and I agree with the resident's findings and plan as documented in the resident's note.  Chief Complaint(s): Hypoglycemia  History - key components related to admission:  Please see resident note for details.  Briefly, [name] is a 72 year old man with a history of diabetes, HFpEF, HTN, COPD on home oxygen, and CKD stage IV who was admitted with hypoglycemia. He was just started on insulin, but his family had not yet picked it up and given it to him. Over the weekend, they called the resident on call and spoke with her about starting the insulin. At first, he was reluctant to take the medicine, but his daughter talked him into it. Somehow, he was given 100 units of NovoLog. His family noted he was acting off and sweaty, and called 911. Glucose was 40, and he was given IV dextrose.  On arrival to the ED, he was also experiencing shortness of breath and desaturation 85%. He was briefly placed on BiPAP with improvement in his breathing.  Physical Exam - key components related to admission:  Vitals:   03/05/17 0910 03/05/17 0930 03/05/17 1100 03/05/17 1521  BP:  (!) 155/79 (!) 158/93   Pulse: 92 98 96   Resp: 20 17 (!) 31 (!) 24  Temp:   98 F (36.7 C)   TempSrc:   Oral   SpO2: 95% 96% 95% 98%  Weight:      Height:       General: Alert, pleasant, not in distress HEENT: Moist mucous membranes Neck: No JVD, no lymphadenopathy Cardiovascular: Regular rate and rhythm, no murmurs rubs or gallops Pulmonary: Diminished bilaterally, normal work of breathing Abdominal: Soft, nontender, not distended, normal bowel sounds Extremities: Warm, no edema Skin: No significant rashes or lesions Neuro: Alert and oriented, speech normal  Significant test results: Glucose 40 on arrival, greater than 75 since then ABG  7.30/50/69/24 BNP 38  Assessment and Plan: I have seen and evaluated the patient as outlined in the resident's note. I agree with the formulated Assessment and Plan as detailed in the resident's note, with the following changes:   Principal Problem:   Hypoglycemia Active Problems:   Type 2 diabetes mellitus with stage 4 chronic kidney disease (HCC)   Acute on chronic respiratory failure with hypoxia (HCC)   1. Hypoglycemia: Due to unintentional overdose of NovoLog insulin. He was initially on a dextrose drip, with no further episodes of hypoglycemia, and we stopped the dextrose drip this morning. If he is able to maintain his glucose throughout the day, he should be able to go home this afternoon. I have advised him not to use any more insulin until he and his family can come into the clinic and review dosing instructions to avoid any further episodes.  2. Acute hypoxic respiratory failure: Resolved after BiPAP and furosemide 40 mg IV. No further intervention needed. Back to baseline respiratory status  Anne Shutteraines, Alexander N, MD 2/24/20195:54 PM

## 2017-03-05 NOTE — ED Triage Notes (Signed)
Pt presents to ER from home with [facility] who states that pt began new medication tonight; pt prescribed 10-15 units novolog TID and was accidentally given 100 units novolog instead; first responders and EMS arrived on scene to find patient excessively sweating, confused, and lethargic; EMS started PIV and gave 250cc D10%W which improved CBG to 230; pts CBG on arrival to C28 was 40- 25g Dextrose given IV

## 2017-03-05 NOTE — ED Notes (Signed)
April - [id number]

## 2017-03-05 NOTE — H&P (Signed)
Date: 03/05/2017               Patient Name:  Ross Bauer MRN: [id number]  DOB: December 26, 1945 Age / Sex: 72 y.o., male   PCP: Doneen Poisson, MD         Medical Service: Internal Medicine Teaching Service         Attending Physician: Dr. Sandre Kitty Elwin Mocha, MD    First Contact: Dr. Frances Furbish Pager: [phone]  Second Contact: Dr. Nelson Chimes Pager: [phone]       After Hours (After 5p/  First Contact Pager: [phone]  weekends / holidays): Second Contact Pager: [phone]   Chief Complaint: Hypoglycemia   History of Present Illness: Ross Bauer is a 72 y.o male with DM, HFpEF, HTN, COPD on home oxygen, and CKD Stage IV who presented to the ED with hypoglycemia. Patient is supposed to be on Novolog 10 units TID but was accidental given 100 units this evening. Per the daughter the patient was given the insulin at approximately 21:00 on 2/23. Initially the patient became diaphoretic, then began to experience generalized weakness. EMS was called and on arrival patient was diaphoretic, confused, and lethargic. CBG was found to be 40. He was given of D10 then 25g of dextrose IV push.   He was also experiencing some SOB with oxygen saturation around 85% on his home 4L/[place] oxygen. It was increased to 5L/[place] on 2/22. He denies recent illness or URI symptoms. He otherwise has been taking his medication without issue. CXR in the ED illustrating some increased pulmonary vascular congestion and bilateral pleural effusions. He was placed on BiPAP due to increased work of breathing.   Meds:  Current Meds  Medication Sig  . albuterol (PROVENTIL HFA;VENTOLIN HFA) 108 (90 Base) MCG/ACT inhaler Inhale 2 puffs into the lungs every 6 (six) hours as needed for wheezing or shortness of breath.  [name] amLODipine (NORVASC) 10 MG tablet Take 1 tablet (10 mg total) daily by mouth.  [name] aspirin 81 MG tablet Take 1 tablet (81 mg total) by mouth daily.  . ferrous sulfate 325 (65 FE) MG tablet Take 1 tablet (325 mg  total) by mouth daily with breakfast.  . furosemide (LASIX) 40 MG tablet Take 1 tablet (40 mg total) by mouth daily.  . insulin aspart (NOVOLOG) 100 UNIT/ML injection Use Sliding Scale 3 times daily with meals CBG 70-120 use 0 units CBG 121-150 use 2 units CBG 151-200 use 3 units CBG 201-250 use 5 units CBG 251-300 use 8 units CBG 301-350 use 11 units CBG 351-400 use 15 units  . Nutritional Supplements (FEEDING SUPPLEMENT, NEPRO CARB STEADY,) LIQD Take 237 mLs by mouth 3 (three) times daily between meals.   Allergies: Allergies as of 03/05/2017 - Review Complete 03/05/2017  Allergen Reaction Noted  . Ace inhibitors     Past Medical History:  Diagnosis Date  . Acute on chronic diastolic (congestive) heart failure (HCC)   . Acute on chronic respiratory failure (HCC)   . Anemia in chronic kidney disease 06/19/2009  . Background diabetic retinopathy associated with type 2 diabetes mellitus (HCC) 11/13/2013   Bilateral. Followed by ophthalmologist [name].  [name] BPH (benign prostatic hyperplasia) 09/30/2016  . Chronic back pain 05/09/2007   "all over" (01/02/2017)  . Erectile dysfunction associated with type 2 diabetes mellitus (HCC) 2001  . Essential hypertension   . Hyperlipidemia   . Onychomycosis of right great toe 06/01/2015  . Pneumonia 12/2016   CAP LLL  12/2016  . 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 Lowell GuitarPowell.  . Tobacco abuse 03/20/2007  . Type 2 diabetes mellitus with stage 4 chronic kidney disease (HCC)    Family History:  Family: + Cancer  Social History:  Lives alone  Former smoker  Denies use of EtOH or illicit substance   Review of Systems: A complete ROS was negative except as per HPI.   Physical Exam: Blood pressure 140/68, pulse 86, temperature (!) 94.3 F (34.6 C), temperature source Oral, resp. rate (!) 25, height 5\' 6"  (1.676 m), weight 140 lb (63.5 kg), SpO2 92 %.  General: Thin elderly  male resting comfortably on BiPAP HENT: Normocephalic, atraumatic, moist mucus membranes  Pulm: Difficult to auscultate while on BiPAP but no apparent crackles or wheezing  CV: Distant heart sounds, RRR, no murmurs, no rubs  Abdomen: Active bowel sounds, soft, non-distended, no tenderness to palpation  Extremities: Radial pulses palpable bilaterally, mild pitting edema in the LEs bilaterally  Skin: Warm and dry  Neuro: Alert and oriented x 3 with no focal weakness.   CXR: personally reviewed: my interpretation is poor volumes. No bony or soft tissue abnormalities. Increased pulmonary vascular congestion and bilateraly pleural effusions. Similar appearance to CXR on 2/01 when patient was admitted for HF exacerbation.   Assessment & Plan by Problem: Active Problems:   Hypoglycemia  Ross Bauer is a 72 y.o male with DM, HFpEF, HTN, COPD on home oxygen, and CKD Stage IV who presented to the ED with hypoglycemia after getting 100 units of Novolog around 21:00 on 2/23. In the ED he was placed on a D10% infusion and subsequently became short of breath requiring BiPAP therapy. The ED notified [facility].   Hypoglycemia  Patient presented with hypoglycemia after received 100 units Novolog instead of 10 units that was ordered. This happened at approximately 21:00 on 2/23. We have stopped the D10% infusion and transitioned the patient to D50 amps as needed with frequent CBG checks. Short acting insulin should be past its peak and patient's CBG should begin to stabilize fairly soon.  - Stop D10% infusion  - D50 amps PRN to maintain CBG >100  - CBG checks q30 minutes for 2 hours, then every 1 hour for 4 hours  - Diabetes educator consult and discussion on proper insulin use   HFpEF Patient recently admitted and discharged on 2/08 for HF exacerbation. On CXR today he has increased pulmonary vascular congestion and bilateral pleural effusions. This is likely multifactorial from the fluids he has received since  in the ED and probably some noncompliance with fluid restriction at home (had to increase his oxygen from 4 to 5L/[place] on Friday due to low pulse ox). Patient currently on BiPAP due to increased work of breathing.  - Patient given Furosemide 40 mg IV x 1  - Stopped D10% infusion   COPD on Home Oxygen  - Holding albuterol inhalers for now in setting of hypoglycemia  - Currently on BiPAP will transition back to [place] when possible   Anemia in CKD  - Holding Epo   Hypertension  - Continue Amlodipine   Diet: Carb/renal  VTE ppx: Renally dosed Lovenox  Code Status: Full Code  Dispo: Admit patient to Observation with expected length of stay less than 2 midnights.  Signed: Levora DredgeHelberg, Areyana Leoni, MD 03/05/2017, 4:01 AM  My Pager: [phone]

## 2017-03-05 NOTE — ED Notes (Signed)
Called [facility] and left a message out of concern for the patients safety at home. In the message I asked them to return a call when they return to the office on Monday.

## 2017-03-05 NOTE — Progress Notes (Signed)
   Subjective: Patient was complaining of shortness of breath when seen this morning.  He was little annoyed by that type of care he was getting.  According to patient this is the first time that he get accidentally high dose of insulin. He was little agitated with nursing staff too. We had a long discussion regarding proper use of insulin, it did not give me a good response, just stating that he wants to go home.  Objective:  Vital signs in last 24 hours: Vitals:   03/05/17 0910 03/05/17 0930 03/05/17 1100 03/05/17 1521  BP:  (!) 155/79 (!) 158/93   Pulse: 92 98 96   Resp: 20 17 (!) 31 (!) 24  Temp:   98 F (36.7 C)   TempSrc:   Oral   SpO2: 95% 96% 95% 98%  Weight:      Height:       General.  Well-developed elderly man, in no acute distress. Lungs.  Decreased breath sounds bilaterally. CV.  Lower rate and rhythm with distant heart sound. Abdomen.  Soft, nontender, bowel sounds positive. Extremities.  Trace lower extremity edema bilaterally.  Assessment/Plan:  Ross Bauer is a 72 y.o male with DM, HFpEF, HTN, COPD on home oxygen, and CKD Stage IV who presented to the ED with hypoglycemia after getting 100 units of Novolog around 21:00 on 2/23 instead of 10.  Hypoglycemia.  Resolved, CBG stable in low 200s.  Patient wants to be discharged home.  We had a good discussion regarding safe use of insulin.  Patient continued to stay on the wall and did not give me a good response.  His behavior was pretty much similar to his previous hospitalization. -He will be discharged home with instructions to follow-up with PCP and just take NovoLog according to his sliding scale.  He will need someone to keep monitoring his blood sugar and injecting insulin accordingly.  HFpEF.  Patient with recent admission due to heart failure exacerbation.  His symptoms and volume was very hard to control.  Pulmonary, cardiology and nephrology was consulted during that hospitalization and they all recommended  palliative care.  Patient currently do not want to involve hospice and wants to stay home with home health. During current hospitalization he was placed on BiPAP for a short period of time because of his complaint of worsening shortness of breath.  When seen this morning he was saturating well on his home O2 at 3-4 L. -Continue home dose of Lasix 40 mg daily.  COPD on Home Oxygen.  No other sign of acute exacerbation.  He can resume his home meds on discharge.  CKD.  Creatinine at baseline.  Hypertension  - Continue Amlodipine    Dispo: Being discharged today.  Arnetha CourserAmin, Joas Motton, MD 03/05/2017, 4:15 PM Pager: [phone]

## 2017-03-05 NOTE — ED Notes (Signed)
Pt ate all apple sauce and drinking apple juice

## 2017-03-06 DIAGNOSIS — E1122 Type 2 diabetes mellitus with diabetic chronic kidney disease: Secondary | ICD-10-CM | POA: Diagnosis not present

## 2017-03-06 DIAGNOSIS — I129 Hypertensive chronic kidney disease with stage 1 through stage 4 chronic kidney disease, or unspecified chronic kidney disease: Secondary | ICD-10-CM | POA: Diagnosis not present

## 2017-03-06 DIAGNOSIS — E785 Hyperlipidemia, unspecified: Secondary | ICD-10-CM | POA: Diagnosis not present

## 2017-03-06 DIAGNOSIS — R1312 Dysphagia, oropharyngeal phase: Secondary | ICD-10-CM | POA: Diagnosis not present

## 2017-03-06 DIAGNOSIS — N184 Chronic kidney disease, stage 4 (severe): Secondary | ICD-10-CM | POA: Diagnosis not present

## 2017-03-06 DIAGNOSIS — N400 Enlarged prostate without lower urinary tract symptoms: Secondary | ICD-10-CM | POA: Diagnosis not present

## 2017-03-06 DIAGNOSIS — D631 Anemia in chronic kidney disease: Secondary | ICD-10-CM | POA: Diagnosis not present

## 2017-03-06 DIAGNOSIS — Z8701 Personal history of pneumonia (recurrent): Secondary | ICD-10-CM | POA: Diagnosis not present

## 2017-03-06 DIAGNOSIS — L89322 Pressure ulcer of left buttock, stage 2: Secondary | ICD-10-CM | POA: Diagnosis not present

## 2017-03-07 DIAGNOSIS — R1312 Dysphagia, oropharyngeal phase: Secondary | ICD-10-CM | POA: Diagnosis not present

## 2017-03-07 DIAGNOSIS — D631 Anemia in chronic kidney disease: Secondary | ICD-10-CM | POA: Diagnosis not present

## 2017-03-07 DIAGNOSIS — Z8701 Personal history of pneumonia (recurrent): Secondary | ICD-10-CM | POA: Diagnosis not present

## 2017-03-07 DIAGNOSIS — E1122 Type 2 diabetes mellitus with diabetic chronic kidney disease: Secondary | ICD-10-CM | POA: Diagnosis not present

## 2017-03-07 DIAGNOSIS — L89322 Pressure ulcer of left buttock, stage 2: Secondary | ICD-10-CM | POA: Diagnosis not present

## 2017-03-07 DIAGNOSIS — I129 Hypertensive chronic kidney disease with stage 1 through stage 4 chronic kidney disease, or unspecified chronic kidney disease: Secondary | ICD-10-CM | POA: Diagnosis not present

## 2017-03-07 DIAGNOSIS — N184 Chronic kidney disease, stage 4 (severe): Secondary | ICD-10-CM | POA: Diagnosis not present

## 2017-03-07 DIAGNOSIS — E785 Hyperlipidemia, unspecified: Secondary | ICD-10-CM | POA: Diagnosis not present

## 2017-03-07 DIAGNOSIS — N400 Enlarged prostate without lower urinary tract symptoms: Secondary | ICD-10-CM | POA: Diagnosis not present

## 2017-03-08 DIAGNOSIS — Z9181 History of falling: Secondary | ICD-10-CM | POA: Diagnosis not present

## 2017-03-08 DIAGNOSIS — E785 Hyperlipidemia, unspecified: Secondary | ICD-10-CM | POA: Diagnosis not present

## 2017-03-08 DIAGNOSIS — R1312 Dysphagia, oropharyngeal phase: Secondary | ICD-10-CM | POA: Diagnosis not present

## 2017-03-08 DIAGNOSIS — J449 Chronic obstructive pulmonary disease, unspecified: Secondary | ICD-10-CM | POA: Diagnosis not present

## 2017-03-08 DIAGNOSIS — E1122 Type 2 diabetes mellitus with diabetic chronic kidney disease: Secondary | ICD-10-CM | POA: Diagnosis not present

## 2017-03-08 DIAGNOSIS — R0602 Shortness of breath: Secondary | ICD-10-CM | POA: Diagnosis not present

## 2017-03-08 DIAGNOSIS — Z8701 Personal history of pneumonia (recurrent): Secondary | ICD-10-CM | POA: Diagnosis not present

## 2017-03-08 DIAGNOSIS — M6281 Muscle weakness (generalized): Secondary | ICD-10-CM | POA: Diagnosis not present

## 2017-03-08 DIAGNOSIS — D631 Anemia in chronic kidney disease: Secondary | ICD-10-CM | POA: Diagnosis not present

## 2017-03-08 DIAGNOSIS — I129 Hypertensive chronic kidney disease with stage 1 through stage 4 chronic kidney disease, or unspecified chronic kidney disease: Secondary | ICD-10-CM | POA: Diagnosis not present

## 2017-03-08 DIAGNOSIS — L89322 Pressure ulcer of left buttock, stage 2: Secondary | ICD-10-CM | POA: Diagnosis not present

## 2017-03-08 DIAGNOSIS — N400 Enlarged prostate without lower urinary tract symptoms: Secondary | ICD-10-CM | POA: Diagnosis not present

## 2017-03-08 DIAGNOSIS — N184 Chronic kidney disease, stage 4 (severe): Secondary | ICD-10-CM | POA: Diagnosis not present

## 2017-03-08 DIAGNOSIS — J189 Pneumonia, unspecified organism: Secondary | ICD-10-CM | POA: Diagnosis not present

## 2017-03-08 DIAGNOSIS — M545 Low back pain: Secondary | ICD-10-CM | POA: Diagnosis not present

## 2017-03-09 DIAGNOSIS — J189 Pneumonia, unspecified organism: Secondary | ICD-10-CM | POA: Diagnosis not present

## 2017-03-09 DIAGNOSIS — Z9181 History of falling: Secondary | ICD-10-CM | POA: Diagnosis not present

## 2017-03-09 DIAGNOSIS — M545 Low back pain: Secondary | ICD-10-CM | POA: Diagnosis not present

## 2017-03-09 DIAGNOSIS — J449 Chronic obstructive pulmonary disease, unspecified: Secondary | ICD-10-CM | POA: Diagnosis not present

## 2017-03-09 DIAGNOSIS — R0602 Shortness of breath: Secondary | ICD-10-CM | POA: Diagnosis not present

## 2017-03-09 DIAGNOSIS — M6281 Muscle weakness (generalized): Secondary | ICD-10-CM | POA: Diagnosis not present

## 2017-03-10 ENCOUNTER — Other Ambulatory Visit: Payer: Self-pay

## 2017-03-10 ENCOUNTER — Telehealth: Payer: Self-pay | Admitting: *Deleted

## 2017-03-10 DIAGNOSIS — E1122 Type 2 diabetes mellitus with diabetic chronic kidney disease: Secondary | ICD-10-CM | POA: Diagnosis not present

## 2017-03-10 DIAGNOSIS — D631 Anemia in chronic kidney disease: Secondary | ICD-10-CM | POA: Diagnosis not present

## 2017-03-10 DIAGNOSIS — L89322 Pressure ulcer of left buttock, stage 2: Secondary | ICD-10-CM | POA: Diagnosis not present

## 2017-03-10 DIAGNOSIS — E119 Type 2 diabetes mellitus without complications: Secondary | ICD-10-CM

## 2017-03-10 DIAGNOSIS — N400 Enlarged prostate without lower urinary tract symptoms: Secondary | ICD-10-CM | POA: Diagnosis not present

## 2017-03-10 DIAGNOSIS — E785 Hyperlipidemia, unspecified: Secondary | ICD-10-CM | POA: Diagnosis not present

## 2017-03-10 DIAGNOSIS — I5033 Acute on chronic diastolic (congestive) heart failure: Secondary | ICD-10-CM

## 2017-03-10 DIAGNOSIS — I129 Hypertensive chronic kidney disease with stage 1 through stage 4 chronic kidney disease, or unspecified chronic kidney disease: Secondary | ICD-10-CM | POA: Diagnosis not present

## 2017-03-10 DIAGNOSIS — Z8701 Personal history of pneumonia (recurrent): Secondary | ICD-10-CM | POA: Diagnosis not present

## 2017-03-10 DIAGNOSIS — N184 Chronic kidney disease, stage 4 (severe): Secondary | ICD-10-CM | POA: Diagnosis not present

## 2017-03-10 DIAGNOSIS — R1312 Dysphagia, oropharyngeal phase: Secondary | ICD-10-CM | POA: Diagnosis not present

## 2017-03-10 DIAGNOSIS — I10 Essential (primary) hypertension: Secondary | ICD-10-CM

## 2017-03-10 NOTE — Telephone Encounter (Signed)
aspirin 81 MG tablet  amLODipine (NORVASC) 10 MG tablet  ferrous sulfate 325 (65 FE) MG tablet  furosemide (LASIX) 40 MG tablet, and Fluticason nasal spray.  REFILL REQUEST @ [facility] [address].

## 2017-03-10 NOTE — Telephone Encounter (Signed)
[facility] requesting a dme order for nebulizer machine. Pt stated that he felt better after getting breathing treatments while he was in the hospital.  RN states pt has albuterol nebulizer solution already, but pt's current medication list does not reflect that.  Will forward DME request to pcp for review along with rx request  for albuterol neb solution. Please advise.Criss AlvineGoldston, Sevin Farone Cassady3/1/20199:56 AM

## 2017-03-11 ENCOUNTER — Other Ambulatory Visit: Payer: Self-pay | Admitting: Internal Medicine

## 2017-03-11 DIAGNOSIS — J438 Other emphysema: Secondary | ICD-10-CM

## 2017-03-11 MED ORDER — IPRATROPIUM-ALBUTEROL 0.5-2.5 (3) MG/3ML IN SOLN: 3.0000 mL | Freq: Four times a day (QID) | RESPIRATORY_TRACT | 11 refills | Status: AC | PRN

## 2017-03-11 NOTE — Telephone Encounter (Signed)
DME order for nebulizer has been placed and duoneb prescription has been sent.

## 2017-03-13 MED ORDER — ASPIRIN 81 MG PO TABS: 81.0000 mg | ORAL_TABLET | Freq: Every day | ORAL | 3 refills | Status: AC

## 2017-03-13 MED ORDER — FLUTICASONE PROPIONATE 50 MCG/ACT NA SUSP: 1.0000 | Freq: Every day | NASAL | 0 refills | Status: AC

## 2017-03-13 MED ORDER — FUROSEMIDE 40 MG PO TABS: 40.0000 mg | ORAL_TABLET | Freq: Every day | ORAL | 3 refills | Status: AC

## 2017-03-13 NOTE — Telephone Encounter (Signed)
Call from pt's daughter, pt needs refill on Fluticasone nasal spray; not on current med list. And only need refill on ASA and Furosemide. [facility] Also asked about nebulizer machine - informed order was placed today.   Called [facility] - pharmacist stated Ventolin inh rx received 1/5 but not picked up. Then stated ins will only accept brand name - reran rx thru again , it was accepted.   Called daughter back , informed of inhaler refill.

## 2017-03-13 NOTE — Telephone Encounter (Signed)
A better solution to this problem may be to provide humidified oxygen, this can be discussed with [name] at the next visit.  In the meantime, I will prescribe the fluticasone if it is providing symptomatic relief.

## 2017-03-13 NOTE — Telephone Encounter (Signed)
Daughter stated she prefer a rx for Flonase which may be cheaper. And pt uses it for c/o dry nose related to using oxygen.

## 2017-03-13 NOTE — Addendum Note (Signed)
Addended by: Doneen PoissonKLIMA, Eufelia Veno D on: 03/13/2017 05:02 PM   Modules accepted: Orders

## 2017-03-13 NOTE — Telephone Encounter (Signed)
The Lasix and Asprin have been renewed.  I reviewed our records and I can not find a prescription in the last several years for fluticasone in the EMR.  If he had been on it in the past through our system it would have been before late 2011 when our health system changed to Epic.  Please call Ross Bauer and inform him the fluticasone is over-the-counter (Flonase) and no longer requires a prescription.  If he wishes to have it prescribed, I need to know for what indication before I can prescribe it.

## 2017-03-13 NOTE — Telephone Encounter (Signed)
Order faxed [facility] @ [phone].  Spoke with the patient as well.  [facility] @ [phone] if any questions as to Delivery and payment.

## 2017-03-14 DIAGNOSIS — R0602 Shortness of breath: Secondary | ICD-10-CM | POA: Diagnosis not present

## 2017-03-14 DIAGNOSIS — E785 Hyperlipidemia, unspecified: Secondary | ICD-10-CM | POA: Diagnosis not present

## 2017-03-14 DIAGNOSIS — M6281 Muscle weakness (generalized): Secondary | ICD-10-CM | POA: Diagnosis not present

## 2017-03-14 DIAGNOSIS — I129 Hypertensive chronic kidney disease with stage 1 through stage 4 chronic kidney disease, or unspecified chronic kidney disease: Secondary | ICD-10-CM | POA: Diagnosis not present

## 2017-03-14 DIAGNOSIS — J189 Pneumonia, unspecified organism: Secondary | ICD-10-CM | POA: Diagnosis not present

## 2017-03-14 DIAGNOSIS — D631 Anemia in chronic kidney disease: Secondary | ICD-10-CM | POA: Diagnosis not present

## 2017-03-14 DIAGNOSIS — N400 Enlarged prostate without lower urinary tract symptoms: Secondary | ICD-10-CM | POA: Diagnosis not present

## 2017-03-14 DIAGNOSIS — E1122 Type 2 diabetes mellitus with diabetic chronic kidney disease: Secondary | ICD-10-CM | POA: Diagnosis not present

## 2017-03-14 DIAGNOSIS — J449 Chronic obstructive pulmonary disease, unspecified: Secondary | ICD-10-CM | POA: Diagnosis not present

## 2017-03-14 DIAGNOSIS — L89322 Pressure ulcer of left buttock, stage 2: Secondary | ICD-10-CM | POA: Diagnosis not present

## 2017-03-14 DIAGNOSIS — N184 Chronic kidney disease, stage 4 (severe): Secondary | ICD-10-CM | POA: Diagnosis not present

## 2017-03-14 DIAGNOSIS — M545 Low back pain: Secondary | ICD-10-CM | POA: Diagnosis not present

## 2017-03-14 DIAGNOSIS — Z9181 History of falling: Secondary | ICD-10-CM | POA: Diagnosis not present

## 2017-03-14 DIAGNOSIS — J438 Other emphysema: Secondary | ICD-10-CM | POA: Diagnosis not present

## 2017-03-14 DIAGNOSIS — Z8701 Personal history of pneumonia (recurrent): Secondary | ICD-10-CM | POA: Diagnosis not present

## 2017-03-14 DIAGNOSIS — R1312 Dysphagia, oropharyngeal phase: Secondary | ICD-10-CM | POA: Diagnosis not present

## 2017-03-15 DIAGNOSIS — I10 Essential (primary) hypertension: Secondary | ICD-10-CM | POA: Diagnosis not present

## 2017-03-15 DIAGNOSIS — J449 Chronic obstructive pulmonary disease, unspecified: Secondary | ICD-10-CM | POA: Diagnosis not present

## 2017-03-15 DIAGNOSIS — I509 Heart failure, unspecified: Secondary | ICD-10-CM | POA: Diagnosis not present

## 2017-03-15 DIAGNOSIS — E162 Hypoglycemia, unspecified: Secondary | ICD-10-CM | POA: Diagnosis not present

## 2017-03-15 DIAGNOSIS — J9621 Acute and chronic respiratory failure with hypoxia: Secondary | ICD-10-CM | POA: Diagnosis not present

## 2017-03-15 DIAGNOSIS — N184 Chronic kidney disease, stage 4 (severe): Secondary | ICD-10-CM | POA: Diagnosis not present

## 2017-03-15 DIAGNOSIS — Z0001 Encounter for general adult medical examination with abnormal findings: Secondary | ICD-10-CM | POA: Diagnosis not present

## 2017-03-15 DIAGNOSIS — R627 Adult failure to thrive: Secondary | ICD-10-CM | POA: Diagnosis not present

## 2017-03-16 ENCOUNTER — Encounter (HOSPITAL_COMMUNITY)
Admission: RE | Admit: 2017-03-16 | Discharge: 2017-03-16 | Disposition: A | Discharge status: Home / Self Care | Payer: Medicare HMO | Source: Ambulatory Visit | Attending: Nephrology | Admitting: Nephrology

## 2017-03-16 VITALS — BP 158/75 | HR 95 | Temp 98.3°F | Resp 20

## 2017-03-16 DIAGNOSIS — N184 Chronic kidney disease, stage 4 (severe): Secondary | ICD-10-CM | POA: Insufficient documentation

## 2017-03-16 DIAGNOSIS — D631 Anemia in chronic kidney disease: Secondary | ICD-10-CM | POA: Insufficient documentation

## 2017-03-16 DIAGNOSIS — I129 Hypertensive chronic kidney disease with stage 1 through stage 4 chronic kidney disease, or unspecified chronic kidney disease: Secondary | ICD-10-CM

## 2017-03-16 LAB — POCT HEMOGLOBIN-HEMACUE: HEMOGLOBIN: 12 g/dL — AB (ref 13.0–17.0)

## 2017-03-16 MED ORDER — DARBEPOETIN ALFA 200 MCG/0.4ML IJ SOSY: 200.0000 ug | PREFILLED_SYRINGE | INTRAMUSCULAR | Status: DC

## 2017-03-17 DIAGNOSIS — N400 Enlarged prostate without lower urinary tract symptoms: Secondary | ICD-10-CM | POA: Diagnosis not present

## 2017-03-17 DIAGNOSIS — N184 Chronic kidney disease, stage 4 (severe): Secondary | ICD-10-CM | POA: Diagnosis not present

## 2017-03-17 DIAGNOSIS — Z8701 Personal history of pneumonia (recurrent): Secondary | ICD-10-CM | POA: Diagnosis not present

## 2017-03-17 DIAGNOSIS — D631 Anemia in chronic kidney disease: Secondary | ICD-10-CM | POA: Diagnosis not present

## 2017-03-17 DIAGNOSIS — E785 Hyperlipidemia, unspecified: Secondary | ICD-10-CM | POA: Diagnosis not present

## 2017-03-17 DIAGNOSIS — E1122 Type 2 diabetes mellitus with diabetic chronic kidney disease: Secondary | ICD-10-CM | POA: Diagnosis not present

## 2017-03-17 DIAGNOSIS — R1312 Dysphagia, oropharyngeal phase: Secondary | ICD-10-CM | POA: Diagnosis not present

## 2017-03-17 DIAGNOSIS — L89322 Pressure ulcer of left buttock, stage 2: Secondary | ICD-10-CM | POA: Diagnosis not present

## 2017-03-17 DIAGNOSIS — I129 Hypertensive chronic kidney disease with stage 1 through stage 4 chronic kidney disease, or unspecified chronic kidney disease: Secondary | ICD-10-CM | POA: Diagnosis not present

## 2017-03-20 ENCOUNTER — Telehealth: Payer: Self-pay | Admitting: *Deleted

## 2017-03-20 DIAGNOSIS — N184 Chronic kidney disease, stage 4 (severe): Principal | ICD-10-CM

## 2017-03-20 DIAGNOSIS — E1122 Type 2 diabetes mellitus with diabetic chronic kidney disease: Secondary | ICD-10-CM

## 2017-03-20 MED ORDER — GLIPIZIDE 5 MG PO TABS: 5.0000 mg | ORAL_TABLET | Freq: Two times a day (BID) | ORAL | 3 refills | Status: AC

## 2017-03-20 NOTE — Telephone Encounter (Signed)
I am sorry for the confusion.  This stems from his recent admissions to the hospital.  There is absolutely no indication for insulin at this time and he should avoid all insulin.  I have removed this from his medication list.  I have restarted the glipizide, but at a lower dose of 5 mg twice daily.  I have sent this prescription to his [facility].  Please call him with this clarification in his regimen.  Thank You.

## 2017-03-20 NOTE — Telephone Encounter (Addendum)
Attempted to contact pt with glipizide instructions-no anwer, message left on recorder.Kingsley SpittleGoldston, Darlene Cassady3/11/20193:39 PM

## 2017-03-20 NOTE — Telephone Encounter (Signed)
Call from patient-wants to know if he is suppose to be on glipizide.  Pt recently discharged from hospital for hypoglycemic episode after accidental overdose of his new insulin.  Glipizide no longer on pt's medication list, pt informed he is NOT to take glipizide at this time. Will send notice to front office to schedule pt a hospital f/u appt.  Will send to pcp for confirmation, please advise.Kingsley SpittleGoldston, Nelsy Madonna Cassady3/11/20192:15 PM

## 2017-03-20 NOTE — Telephone Encounter (Signed)
Received return call from patient-pt verbalized understanding afte he was instructed  to take glipizide 5mg  twice a daily and to avoid insulin.  An appt time/date of 03/22 @ 9:15am w/pcp was also confirmed with patient.Ross SpittleGoldston, Ross Buell Cassady3/11/20193:51 PM

## 2017-03-31 ENCOUNTER — Ambulatory Visit: Payer: Medicare HMO | Admitting: Internal Medicine

## 2017-04-02 DIAGNOSIS — R627 Adult failure to thrive: Secondary | ICD-10-CM | POA: Diagnosis not present

## 2017-04-02 DIAGNOSIS — J449 Chronic obstructive pulmonary disease, unspecified: Secondary | ICD-10-CM | POA: Diagnosis not present

## 2017-04-06 DIAGNOSIS — Z9181 History of falling: Secondary | ICD-10-CM | POA: Diagnosis not present

## 2017-04-06 DIAGNOSIS — J189 Pneumonia, unspecified organism: Secondary | ICD-10-CM | POA: Diagnosis not present

## 2017-04-06 DIAGNOSIS — M545 Low back pain: Secondary | ICD-10-CM | POA: Diagnosis not present

## 2017-04-06 DIAGNOSIS — M6281 Muscle weakness (generalized): Secondary | ICD-10-CM | POA: Diagnosis not present

## 2017-04-06 DIAGNOSIS — J449 Chronic obstructive pulmonary disease, unspecified: Secondary | ICD-10-CM | POA: Diagnosis not present

## 2017-04-06 DIAGNOSIS — R0602 Shortness of breath: Secondary | ICD-10-CM | POA: Diagnosis not present

## 2017-04-14 DIAGNOSIS — J438 Other emphysema: Secondary | ICD-10-CM | POA: Diagnosis not present

## 2017-04-14 DIAGNOSIS — R0602 Shortness of breath: Secondary | ICD-10-CM | POA: Diagnosis not present

## 2017-04-14 DIAGNOSIS — M545 Low back pain: Secondary | ICD-10-CM | POA: Diagnosis not present

## 2017-04-14 DIAGNOSIS — J189 Pneumonia, unspecified organism: Secondary | ICD-10-CM | POA: Diagnosis not present

## 2017-04-14 DIAGNOSIS — J449 Chronic obstructive pulmonary disease, unspecified: Secondary | ICD-10-CM | POA: Diagnosis not present

## 2017-04-14 DIAGNOSIS — Z9181 History of falling: Secondary | ICD-10-CM | POA: Diagnosis not present

## 2017-04-14 DIAGNOSIS — M6281 Muscle weakness (generalized): Secondary | ICD-10-CM | POA: Diagnosis not present

## 2017-04-17 DIAGNOSIS — R069 Unspecified abnormalities of breathing: Secondary | ICD-10-CM | POA: Diagnosis not present

## 2017-04-19 DIAGNOSIS — R279 Unspecified lack of coordination: Secondary | ICD-10-CM | POA: Diagnosis not present

## 2017-04-19 DIAGNOSIS — Z743 Need for continuous supervision: Secondary | ICD-10-CM | POA: Diagnosis not present

## 2018-04-08 ENCOUNTER — Encounter: Payer: Self-pay | Admitting: *Deleted

## 2018-07-19 IMAGING — DX DG CHEST 2V
2 series · 2 of 2 positions shown · non-contrast
Comparison: 01/07/2017

CLINICAL DATA: Dyspnea

EXAM:
CHEST  2 VIEW

[x chest ap]
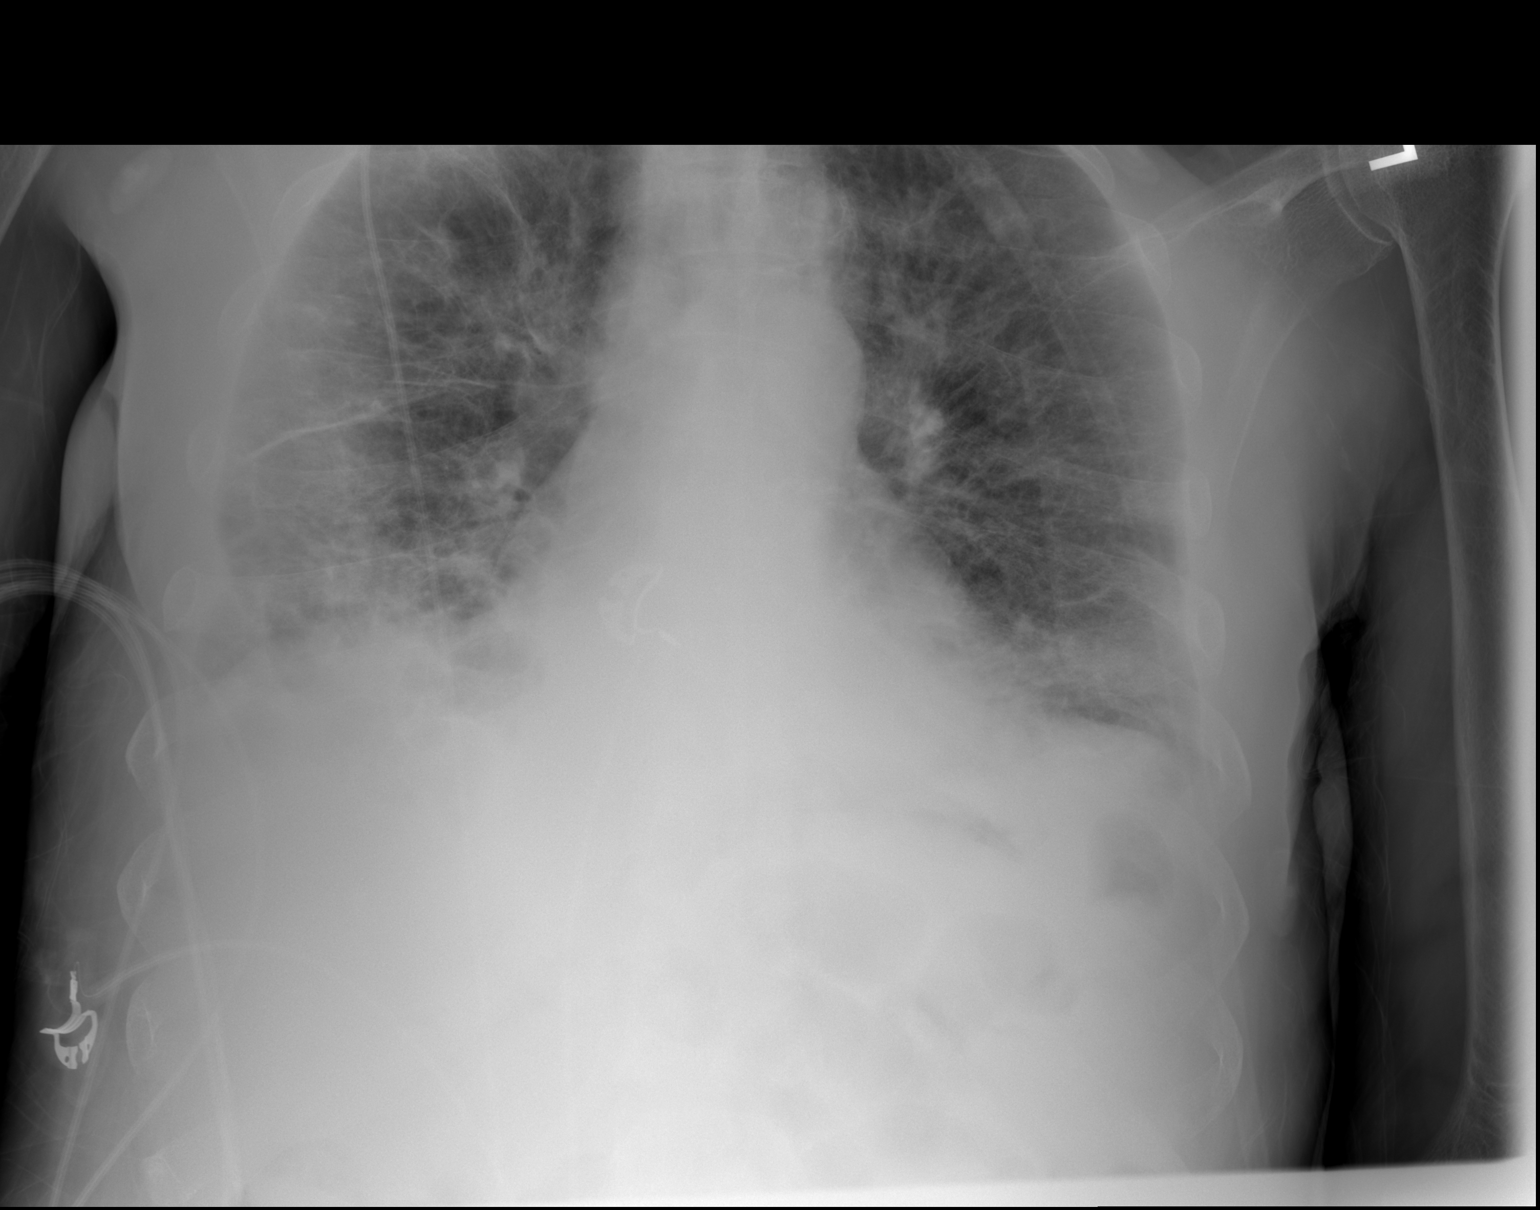

[w chest lat]
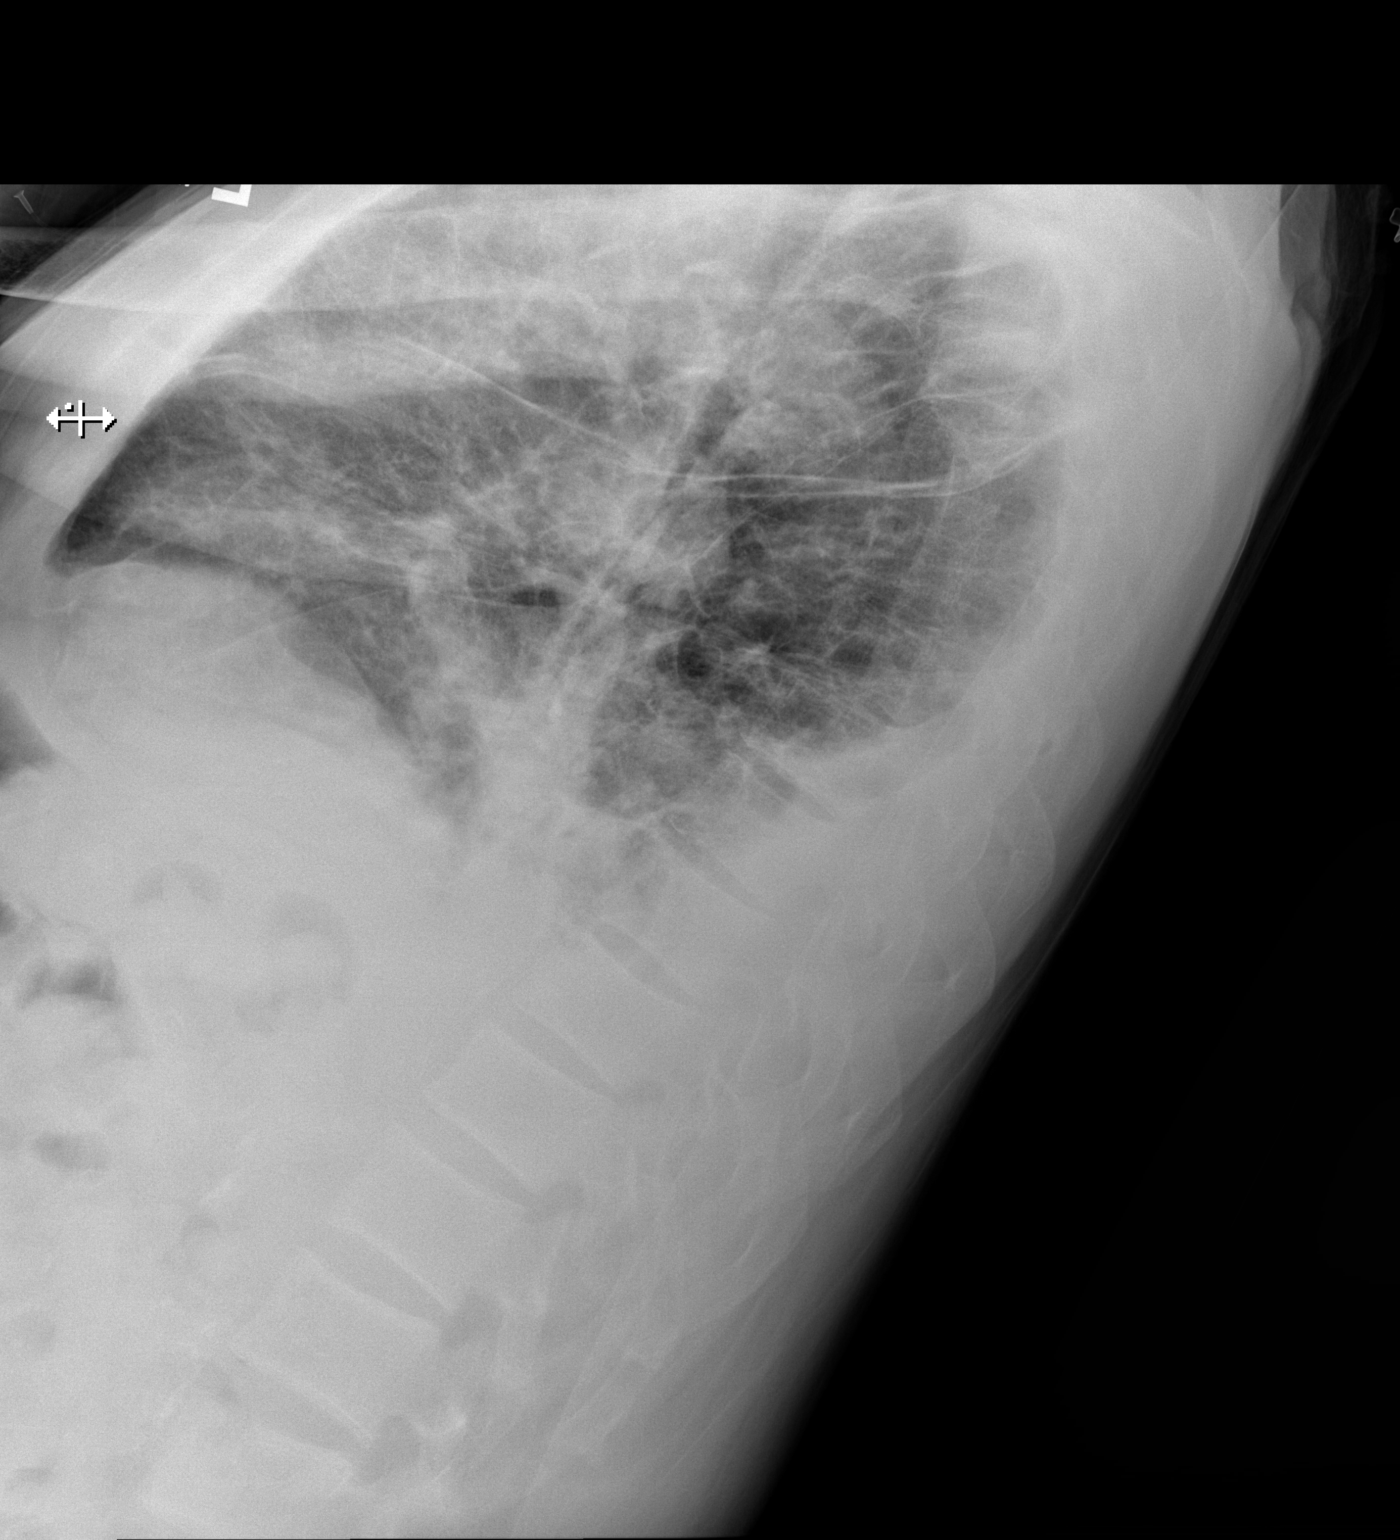

[2 of 2 positions shown; findings below may reference images not displayed]

FINDINGS: Stable borderline cardiomegaly and mediastinal contours. Chronic
interstitial prominence of the lungs with superimposed bibasilar
airspace opacities slightly more prominent at the left lung base and
along the periphery of the right lung since prior exam. Interval
increase in pulmonary vascular congestion. No acute osseous
abnormality.
IMPRESSION: 1. Bibasilar and peripheral right pulmonary opacities suspicious for
pneumonia. A layering right effusion might account for some of the
hazy opacity along the periphery the right lung.
2. Interval increase pulmonary vascular congestion.
